# Patient Record
Sex: Male | Born: 1950
Health system: Southern US, Community
[De-identification: ages and names within clinical notes are randomized; demographics above are authoritative.]

## PROBLEM LIST (undated history)

## (undated) DIAGNOSIS — M1990 Unspecified osteoarthritis, unspecified site: Secondary | ICD-10-CM

## (undated) DIAGNOSIS — E669 Obesity, unspecified: Secondary | ICD-10-CM

## (undated) DIAGNOSIS — G4730 Sleep apnea, unspecified: Secondary | ICD-10-CM

## (undated) DIAGNOSIS — I471 Supraventricular tachycardia, unspecified: Secondary | ICD-10-CM

## (undated) DIAGNOSIS — E785 Hyperlipidemia, unspecified: Secondary | ICD-10-CM

## (undated) DIAGNOSIS — C801 Malignant (primary) neoplasm, unspecified: Secondary | ICD-10-CM

## (undated) DIAGNOSIS — J45909 Unspecified asthma, uncomplicated: Secondary | ICD-10-CM

## (undated) DIAGNOSIS — I2510 Atherosclerotic heart disease of native coronary artery without angina pectoris: Secondary | ICD-10-CM

## (undated) DIAGNOSIS — J449 Chronic obstructive pulmonary disease, unspecified: Secondary | ICD-10-CM

## (undated) DIAGNOSIS — I10 Essential (primary) hypertension: Secondary | ICD-10-CM

## (undated) DIAGNOSIS — R079 Chest pain, unspecified: Secondary | ICD-10-CM

## (undated) DIAGNOSIS — R002 Palpitations: Secondary | ICD-10-CM

## (undated) HISTORY — DX: Unspecified asthma, uncomplicated: J45.909

## (undated) HISTORY — DX: Essential (primary) hypertension: I10

## (undated) HISTORY — DX: Atherosclerotic heart disease of native coronary artery without angina pectoris: I25.10

## (undated) HISTORY — DX: Palpitations: R00.2

## (undated) HISTORY — DX: Chronic obstructive pulmonary disease, unspecified: J44.9

## (undated) HISTORY — PX: MOUTH SURGERY: SHX715

## (undated) HISTORY — PX: NASAL SINUS SURGERY: SHX719

## (undated) HISTORY — DX: Hyperlipidemia, unspecified: E78.5

## (undated) HISTORY — DX: Chest pain, unspecified: R07.9

## (undated) HISTORY — DX: Obesity, unspecified: E66.9

## (undated) HISTORY — DX: Supraventricular tachycardia, unspecified: I47.10

## (undated) HISTORY — PX: SKIN CANCER EXCISION: SHX779

---

## 2001-09-07 ENCOUNTER — Ambulatory Visit (HOSPITAL_COMMUNITY): Admission: RE | Admit: 2001-09-07 | Discharge: 2001-09-07 | Payer: Self-pay | Admitting: Family Medicine

## 2001-09-07 ENCOUNTER — Encounter: Payer: Self-pay | Admitting: Family Medicine

## 2003-06-28 ENCOUNTER — Ambulatory Visit (HOSPITAL_COMMUNITY): Admission: RE | Admit: 2003-06-28 | Discharge: 2003-06-28 | Payer: Self-pay | Admitting: Family Medicine

## 2004-07-10 DIAGNOSIS — R079 Chest pain, unspecified: Secondary | ICD-10-CM

## 2004-07-10 HISTORY — DX: Chest pain, unspecified: R07.9

## 2004-08-08 ENCOUNTER — Ambulatory Visit (HOSPITAL_COMMUNITY): Admission: RE | Admit: 2004-08-08 | Discharge: 2004-08-08 | Payer: Self-pay | Admitting: Family Medicine

## 2004-08-22 ENCOUNTER — Ambulatory Visit (HOSPITAL_COMMUNITY): Admission: RE | Admit: 2004-08-22 | Discharge: 2004-08-22 | Payer: Self-pay | Admitting: Family Medicine

## 2004-10-04 ENCOUNTER — Ambulatory Visit (HOSPITAL_COMMUNITY): Admission: RE | Admit: 2004-10-04 | Discharge: 2004-10-04 | Payer: Self-pay | Admitting: Internal Medicine

## 2004-10-04 ENCOUNTER — Ambulatory Visit: Payer: Self-pay | Admitting: Internal Medicine

## 2004-10-10 DIAGNOSIS — I10 Essential (primary) hypertension: Secondary | ICD-10-CM

## 2004-10-10 HISTORY — PX: COLONOSCOPY: SHX174

## 2004-10-10 HISTORY — DX: Essential (primary) hypertension: I10

## 2005-01-20 ENCOUNTER — Emergency Department (HOSPITAL_COMMUNITY): Admission: EM | Admit: 2005-01-20 | Discharge: 2005-01-20 | Payer: Self-pay | Admitting: Emergency Medicine

## 2005-08-22 ENCOUNTER — Emergency Department (HOSPITAL_COMMUNITY): Admission: EM | Admit: 2005-08-22 | Discharge: 2005-08-22 | Payer: Self-pay | Admitting: Emergency Medicine

## 2009-06-27 ENCOUNTER — Encounter: Payer: Self-pay | Admitting: Orthopedic Surgery

## 2009-07-01 ENCOUNTER — Encounter: Payer: Self-pay | Admitting: Orthopedic Surgery

## 2009-07-02 ENCOUNTER — Ambulatory Visit: Payer: Self-pay | Admitting: Orthopedic Surgery

## 2009-07-02 DIAGNOSIS — S8000XA Contusion of unspecified knee, initial encounter: Secondary | ICD-10-CM | POA: Insufficient documentation

## 2009-07-05 ENCOUNTER — Ambulatory Visit: Payer: Self-pay | Admitting: Orthopedic Surgery

## 2009-07-20 ENCOUNTER — Ambulatory Visit (HOSPITAL_COMMUNITY): Admission: RE | Admit: 2009-07-20 | Discharge: 2009-07-20 | Payer: Self-pay | Admitting: Family Medicine

## 2009-08-28 ENCOUNTER — Ambulatory Visit (HOSPITAL_COMMUNITY): Admission: RE | Admit: 2009-08-28 | Discharge: 2009-08-28 | Payer: Self-pay | Admitting: Cardiology

## 2009-09-04 ENCOUNTER — Ambulatory Visit (HOSPITAL_COMMUNITY): Admission: RE | Admit: 2009-09-04 | Discharge: 2009-09-05 | Payer: Self-pay | Admitting: Cardiology

## 2009-09-05 HISTORY — PX: OTHER SURGICAL HISTORY: SHX169

## 2009-10-15 ENCOUNTER — Ambulatory Visit (HOSPITAL_COMMUNITY): Admission: RE | Admit: 2009-10-15 | Discharge: 2009-10-15 | Payer: Self-pay | Admitting: Cardiology

## 2010-06-11 NOTE — Assessment & Plan Note (Signed)
Summary: RE-CK LT LEG/W.COMP DOI 06/27/09/CAF   Visit Type:  Follow-up Primary Provider:  Dr. Phillips Odor  CC:  left knee pain.  History of Present Illness: I saw Omar Ruiz in the office today for an initial visit.  He is a 60 years old man with the complaint of:  LEFT knee pain  He was injured on February 16.  He works for the evening while company, [employer]  He was out of work Wednesday Thursday Friday Saturday Sunday wants to go back to work today  He was seen at [facility] AP lateral femur AP lateral hip were negative  Complains of some lateral leg soreness and lateral distal femoral swelling.  Meds: Benicar, Fish oil, Aleve, Aspirin, Glucosamine.  DOING WELL NO PROBLEMS   PHYSICAL EXAM WAS NORMAL   D/C   FULL DUTY       Allergies: No Known Drug Allergies   Impression & Recommendations:  Problem # 1:  CONTUSION, LEFT KNEE (ICD-924.11) Assessment Improved  Orders: Est. Patient Level II (84696)  Patient Instructions: 1)  Please schedule a follow-up appointment as needed. 2)  full duty

## 2010-06-11 NOTE — Assessment & Plan Note (Signed)
Summary: LT KNEE/FEMUR INJURY/XRAY [facility]/BRING'G FILM,NOTES RECD...   Vital Signs:  Patient profile:   60 year old male Weight:      243 pounds Pulse rate:   74 / minute Resp:     16 per minute  Vitals Entered By: Fuller Canada MD (July 02, 2009 8:41 AM)  Visit Type:  Worker's Comp Initial Primary Provider:  Dr. Phillips Odor  CC:  left femur pain.Omar Ruiz  History of Present Illness: I saw Omar Ruiz in the office today for an initial visit.  He is a 60 years old man with the complaint of:  LEFT knee pain  He was injured on February 16.  He works for the evening while company, [employer]  He was out of work Wednesday Thursday Friday Saturday Sunday wants to go back to work today  [place] [facility] AP lateral femur AP lateral hip were negative  Complains of some lateral leg soreness and lateral distal femoral swelling.  Meds: Benicar, Fish oil, Aleve, Aspirin, Glucosamine.  he does not complain of pain at this time just some lateral thigh swelling there is a little ecchymosis at this area and subcutaneous skin discoloration.  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 Cardiac :  Denies chest pain, angina, heart attack, heart failure, poor circulation, blood clots, and phlebitis. Resp:  Denies short of breath, difficulty breathing, COPD, cough, and pneumonia. GI:  Denies nausea, vomiting, diarrhea, constipation, difficulty swallowing, ulcers, GERD, and reflux. GU:  Denies  kidney failure, kidney transplant, kidney stones, burning, poor stream, testicular cancer, blood in urine, and . Neuro:  Denies headache, dizziness, migraines, numbness, weakness, tremor, and unsteady walking. MS:  Denies joint pain, rheumatoid arthritis, joint swelling, gout, bone cancer, osteoporosis, and . Endo:  Denies thyroid disease, goiter, and diabetes. Psych:  Denies depression, mood swings, anxiety, panic attack, bipolar, and schizophrenia. Derm:  Denies eczema, cancer, and itching. EENT:  Denies poor vision, cataracts, glaucoma, poor hearing, vertigo, ears ringing, sinusitis, hoarseness, toothaches, and bleeding gums. Immunology:  Denies seasonal allergies, sinus problems, and allergic to bee stings. Lymphatic:  Denies lymph node cancer and lymph edema.  Physical Exam  Additional Exam:  vital signs see recorded data are stable  General appearance normal appearance  Orientation x3 normal  Mood and affect. Normal  Gait and station normal  Cardiovascular pulse temperature, normal  Lymph nodes normal and lower extremities  Sensation normal in lower extremities  Coordination and balance is normal  Body area:both knees examined  Inspection of tenderness in the lateral distal femur soft tissue normal on the LEFT knee normal RIGHT knee , no effusion either knee  Range of motion normal both knees  Stability normal both knees  Strength normal both knees      Impression & Recommendations:  Problem # 1:  CONTUSION, LEFT KNEE (ICD-924.11) Assessment New  x-rays AP lateral femur and hip with reports from [facility] read as normal I agree  Patient can return to work.  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  Orders: New Patient Level III (37106)  Patient Instructions: 1)  Ice left knee 3 x a day  2)  Bend the knee 25 x 3 x a day  3)  return to work to day  4)  f/u as needed

## 2010-06-11 NOTE — Letter (Signed)
Summary: Work [facility]  [address]  [place], [place] [address]   Phone: [phone]  Fax: [phone]    Today's Date: July 02, 2009  Name of Patient: Omar Ruiz  The above named patient had a medical visit today at:  am / pm.  Please take this into consideration when reviewing the time away from work/school.    Special Instructions:   [ X ] Patient is released to return to the normal work schedule today, July 02, 2009, full duty.  [  ] To be off until the next scheduled appointment on ______________________.  [ X] Other ____  Ice LT knee 3X a day Bend knee 25X, 3X a day _______________________________________ ________________________________________________________________________   Sincerely yours,   Terrance Mass, MD

## 2010-06-11 NOTE — Letter (Signed)
Summary: Pt History form  Pt History form   Imported By: Cammie Sickle 08/27/2009 07:07:47  _____________________________________________________________________  External Attachment:    Type:   Image     Comment:   External Document

## 2010-06-11 NOTE — Letter (Signed)
Summary: Internal Other  Internal Other   Imported By: Elvera Maria 07/04/2009 13:29:58  _____________________________________________________________________  External Attachment:    Type:   Image     Comment:   workers comp info

## 2010-06-11 NOTE — Letter (Signed)
Summary: [facility] Medical records W.Comp injury 06/27/09  [facility] records W.Comp injury 06/27/09   Imported By: Cammie Sickle 07/30/2009 11:40:14  _____________________________________________________________________  External Attachment:    Type:   Image     Comment:   External Document

## 2010-06-11 NOTE — Letter (Signed)
Summary: Work note  [facility]  [address]  [place], [place] [address]   Phone: [phone]  Fax: [phone]    July 05, 2009   Employee:  Terius A [employer]    To Whom It May Concern:   For Medical reasons, please excuse the above named employee from work for the following dates:  Start:   Patient had a medical visit in our office today, 8:30 am appointment.  End/Return to work today:  Medically released for full duty work.   If you need additional information, please feel free to contact our office.         Sincerely,    Terrance Mass, MD

## 2010-07-30 LAB — BASIC METABOLIC PANEL
CO2: 29 mEq/L (ref 19–32)
Chloride: 107 mEq/L (ref 96–112)
Glucose, Bld: 96 mg/dL (ref 70–99)
Potassium: 3.6 mEq/L (ref 3.5–5.1)
Sodium: 140 mEq/L (ref 135–145)

## 2010-07-30 LAB — CBC
HCT: 38.1 % — ABNORMAL LOW (ref 39.0–52.0)
Hemoglobin: 13.1 g/dL (ref 13.0–17.0)
MCHC: 34.4 g/dL (ref 30.0–36.0)
Platelets: 216 10*3/uL (ref 150–400)
RBC: 4.12 MIL/uL — ABNORMAL LOW (ref 4.22–5.81)

## 2010-09-27 NOTE — Op Note (Signed)
Omar Ruiz, Omar Ruiz              ACCOUNT NO.:  [id number]   MEDICAL RECORD NO.:  [id number]          PATIENT TYPE:  AMB   LOCATION:  DAY                           FACILITY:  [facility]   PHYSICIAN:  R. Roetta Sessions, M.D. DATE OF BIRTH:  [birth year]   DATE OF PROCEDURE:  10/04/2004  DATE OF DISCHARGE:                                 OPERATIVE REPORT   PROCEDURE:  Screening colonoscopy.   INDICATIONS FOR PROCEDURE:  The patient is a 60 year old Caucasian male sent  over at the courtesy of Dr. Phillips Odor for colorectal cancer screening. He is  devoid of any lower GI tract symptoms. No family history of colorectal  neoplasia. He has never had his colon imaged. Colonoscopy is now being done  as a screening maneuver. This approach has been discussed with the patient  along with potential risks, benefits, and alternatives. Questions answered.  He is agreeable. Please see documentation in the medical record.   PROCEDURE NOTE:  O2 saturation, blood pressure, pulse, and respirations were  monitored throughout the entire procedure. Conscious sedation with Versed 3  mg IV and Demerol 75 mg IV in divided doses.   INSTRUMENT:  Olympus video chip system.   FINDINGS:  Digital rectal exam revealed no abnormalities.   ENDOSCOPIC FINDINGS:  Prep was good.   Rectum:  Examination of the rectal mucosa including retroflexed view of the  anal verge revealed no abnormalities.   Colon:  Colonic mucosa was surveyed from the rectosigmoid junction through  the left, transverse, and right colon to the area of the appendiceal  orifice, ileocecal valve, and cecum. These structures were well seen and  photographed for the record. From this level, the scope was slowly  withdrawn. All previously mentioned mucosa surfaces were again. The colonic  mucosa appeared normal. The patient tolerated the procedure well and was  reactive to endoscopy.   IMPRESSION:  Normal appearing rectum and colon.   RECOMMENDATIONS:   Repeat screening colonoscopy 10 years. Annual stool  Hemoccults.      RMR/MEDQ  D:  10/04/2004  T:  10/04/2004  Job:  [id number]   cc:   Corrie Mckusick, M.D.  Fax: [phone]

## 2012-01-22 ENCOUNTER — Encounter: Payer: Self-pay | Admitting: Orthopedic Surgery

## 2012-01-22 ENCOUNTER — Ambulatory Visit (INDEPENDENT_AMBULATORY_CARE_PROVIDER_SITE_OTHER): Payer: 59 | Admitting: Orthopedic Surgery

## 2012-01-22 ENCOUNTER — Ambulatory Visit (INDEPENDENT_AMBULATORY_CARE_PROVIDER_SITE_OTHER): Payer: 59

## 2012-01-22 VITALS — BP 130/80 | Ht 72.5 in | Wt 243.0 lb

## 2012-01-22 DIAGNOSIS — S83209A Unspecified tear of unspecified meniscus, current injury, unspecified knee, initial encounter: Secondary | ICD-10-CM | POA: Insufficient documentation

## 2012-01-22 DIAGNOSIS — IMO0002 Reserved for concepts with insufficient information to code with codable children: Secondary | ICD-10-CM

## 2012-01-22 DIAGNOSIS — M25561 Pain in right knee: Secondary | ICD-10-CM

## 2012-01-22 DIAGNOSIS — M25569 Pain in unspecified knee: Secondary | ICD-10-CM

## 2012-01-22 DIAGNOSIS — M1710 Unilateral primary osteoarthritis, unspecified knee: Secondary | ICD-10-CM | POA: Insufficient documentation

## 2012-01-22 DIAGNOSIS — M25469 Effusion, unspecified knee: Secondary | ICD-10-CM | POA: Insufficient documentation

## 2012-01-22 NOTE — Progress Notes (Signed)
Subjective:    Patient ID: Omar Ruiz, male    DOB: 09/20/1950, 61 y.o.   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 Presents now with a swollen knee and tightness in the back of the knee medial and lateral joint pain  Knee Pain  The incident occurred more than 1 week ago. The incident occurred at work. The injury mechanism is unknown. The pain is present in the right knee. Quality: Dull and throbbing. The pain is at a severity of 4/10. The pain has been improving since onset. Pertinent negatives include no inability to bear weight, loss of motion, loss of sensation, muscle weakness, numbness or tingling. Associated symptoms comments: Swelling.      Review of Systems  Neurological: Negative for tingling and numbness.  All other systems reviewed and are negative.       Objective:   Physical Exam  Constitutional: He is oriented to person, place, and time. He appears well-developed and well-nourished.  Cardiovascular: Intact distal pulses.   Musculoskeletal:       Right knee: He exhibits effusion.       Ambulation reveals that he favors the right lower extremity  Lymphadenopathy:       Right: No inguinal adenopathy present.       Left: No inguinal adenopathy present.  Neurological: He is alert and oriented to person, place, and time. He has normal reflexes. He displays normal reflexes. No cranial nerve deficit. He exhibits normal muscle tone. Coordination normal.       Normal sensation  Skin: Skin is warm and dry. No rash noted. No erythema. No pallor.  Psychiatric: He has a normal mood and affect. His behavior is normal. Judgment and thought content normal.  Right Knee Exam   Tenderness  The patient is experiencing tenderness in the medial joint line and lateral joint line.  Range of Motion  The patient has normal right knee ROM.  Muscle Strength    The patient has normal right knee strength.  Tests  McMurray:  Medial - negative Lateral - negative Lachman:  Anterior - negative    Posterior - negative Drawer:       Anterior - negative    Posterior - negative Varus: negative Valgus: negative Patellar Apprehension: negative  Other  Erythema: absent Scars: absent Sensation: normal Pulse: present Swelling: severe Other tests: effusion present   Left Knee Exam  Left knee exam is normal.  Tenderness  The patient is experiencing no tenderness.     Range of Motion  The patient has normal left knee ROM.  Tests  Lachman:  Anterior - negative    Posterior - negative Pivot Shift: negative  Other  Erythema: absent Scars: absent Sensation: normal Pulse: present Swelling: none     Upper extremity exam  Inspection and palpation revealed no abnormalities in the upper extremities.  Range of motion is full without contracture.  Motor exam is normal with grade 5 strength.  The joints are fully reduced without subluxation.  There is no atrophy or tremor and muscle tone is normal.  All joints are stable.   Imaging shows that the medial joint space is slightly narrowed there is a joint effusion       Assessment & Plan:  The differential diagnosis torn medial meniscus, osteoarthritis, joint effusion.  Plan aspiration injection continue Aleve. Apply ice daily for 30 minutes.  Knee  Injection and aspiration Procedure Note  Pre-operative Diagnosis: Right knee swelling  Post-operative Diagnosis: same  Indications: pain, swelling  Anesthesia: ethyl chloride   Procedure Details   Verbal consent was obtained for the procedure. Time out was completed.The joint was prepped with alcohol, followed by  Ethyl chloride spray and The 18-gauge needle was inserted into the joint via lateral approach and we aspirated approximately 60 cc of clear yellow fluid  This was followed by the injection of 4ml 1% lidocaine and 1 ml  of depomedrol  was then injected into the joint . The needle was removed and the area cleansed and dressed.  Complications:  None; patient tolerated the procedure well.

## 2012-01-22 NOTE — Patient Instructions (Addendum)
You have received a steroid shot. 15% of patients experience increased pain at the injection site with in the next 24 hours. This is best treated with ice and tylenol extra strength 2 tabs every 8 hours. If you are still having pain please call the office.   Wear brace next 4 weeks   Apply ice to the knee every day for 30 minutes   Knee Effusion The medical term for having fluid in your knee is effusion. This is often due to an internal derangement of the knee. This means something is wrong inside the knee. Some of the causes of fluid in the knee may be torn cartilage, a torn ligament, or bleeding into the joint from an injury. Your knee is likely more difficult to bend and move. This is often because there is increased pain and pressure in the joint. The time it takes for recovery from a knee effusion depends on different factors, including:    Type of injury.   Your age.   Physical and medical conditions.   Rehabilitation Strategies.  How long you will be away from your normal activities will depend on what kind of knee problem you have and how much damage is present. Your knee has two types of cartilage. Articular cartilage covers the bone ends and lets your knee bend and move smoothly. Two menisci, thick pads of cartilage that form a rim inside the joint, help absorb shock and stabilize your knee. Ligaments bind the bones together and support your knee joint. Muscles move the joint, help support your knee, and take stress off the joint itself. CAUSES   Often an effusion in the knee is caused by an injury to one of the menisci. This is often a tear in the cartilage. Recovery after a meniscus injury depends on how much meniscus is damaged and whether you have damaged other knee tissue. Small tears may heal on their own with conservative treatment. Conservative means rest, limited weight bearing activity and muscle strengthening exercises. Your recovery may take up to 6 weeks.   TREATMENT     Larger tears may require surgery. Meniscus injuries may be treated during arthroscopy. Arthroscopy is a procedure in which your surgeon uses a small telescope like instrument to look in your knee. Your caregiver can make a more accurate diagnosis (learning what is wrong) by performing an arthroscopic procedure. If your injury is on the inner margin of the meniscus, your surgeon may trim the meniscus back to a smooth rim. In other cases your surgeon will try to repair a damaged meniscus with stitches (sutures). This may make rehabilitation take longer, but may provide better long term result by helping your knee keep its shock absorption capabilities. Ligaments which are completely torn usually require surgery for repair. HOME CARE INSTRUCTIONS  Use crutches as instructed.   If a brace is applied, use as directed.   Once you are home, an ice pack applied to your swollen knee may help with discomfort and help decrease swelling.   Keep your knee raised (elevated) when you are not up and around or on crutches.   Only take over-the-counter or prescription medicines for pain, discomfort, or fever as directed by your caregiver.   Your caregivers will help with instructions for rehabilitation of your knee. This often includes strengthening exercises.   You may resume a normal diet and activities as directed.  SEEK MEDICAL CARE IF:    There is increased swelling in your knee.   You notice redness,  swelling, or increasing pain in your knee.   An unexplained oral temperature above 102 F (38.9 C) develops.  SEEK IMMEDIATE MEDICAL CARE IF:    You develop a rash.   You have difficulty breathing.   You have any allergic reactions from medications you may have been given.   There is severe pain with any motion of the knee.  MAKE SURE YOU:    Understand these instructions.   Will watch your condition.   Will get help right away if you are not doing well or get worse.  Document Released:  07/19/2003 Document Revised: 04/17/2011 Document Reviewed: 09/22/2007 [facility] Patient Information 2012 [place], [place].

## 2012-02-19 ENCOUNTER — Ambulatory Visit (INDEPENDENT_AMBULATORY_CARE_PROVIDER_SITE_OTHER): Payer: 59 | Admitting: Orthopedic Surgery

## 2012-02-19 ENCOUNTER — Encounter: Payer: Self-pay | Admitting: Orthopedic Surgery

## 2012-02-19 VITALS — BP 120/70 | Ht 72.5 in | Wt 243.0 lb

## 2012-02-19 DIAGNOSIS — M25561 Pain in right knee: Secondary | ICD-10-CM

## 2012-02-19 DIAGNOSIS — IMO0002 Reserved for concepts with insufficient information to code with codable children: Secondary | ICD-10-CM

## 2012-02-19 DIAGNOSIS — S83209A Unspecified tear of unspecified meniscus, current injury, unspecified knee, initial encounter: Secondary | ICD-10-CM

## 2012-02-19 DIAGNOSIS — M25469 Effusion, unspecified knee: Secondary | ICD-10-CM

## 2012-02-19 DIAGNOSIS — M25569 Pain in unspecified knee: Secondary | ICD-10-CM

## 2012-02-19 NOTE — Patient Instructions (Signed)
activities as tolerated 

## 2012-02-19 NOTE — Progress Notes (Signed)
Patient ID: Omar Ruiz, male   DOB: [birth year], 61 y.o.   MRN: [id number] Chief Complaint  Patient presents with  . Follow-up    recheck right knee effusion, DOI 01/12/12     the patient and his right knee we did an aspiration and injection he says is completely better  His exam is benign with no medial tenderness and negative McMurray  Followup as needed improved

## 2012-08-02 ENCOUNTER — Encounter: Payer: Self-pay | Admitting: *Deleted

## 2012-11-02 ENCOUNTER — Other Ambulatory Visit: Payer: Self-pay | Admitting: *Deleted

## 2012-11-02 MED ORDER — PRAVASTATIN SODIUM 40 MG PO TABS: 40.0000 mg | ORAL_TABLET | Freq: Every day | ORAL | Status: DC

## 2012-11-02 NOTE — Telephone Encounter (Signed)
Pravastatin refilled electronically

## 2012-12-16 ENCOUNTER — Ambulatory Visit (INDEPENDENT_AMBULATORY_CARE_PROVIDER_SITE_OTHER): Payer: Worker's Compensation | Admitting: Orthopedic Surgery

## 2012-12-16 ENCOUNTER — Encounter: Payer: Self-pay | Admitting: Orthopedic Surgery

## 2012-12-16 ENCOUNTER — Ambulatory Visit (HOSPITAL_COMMUNITY)
Admission: RE | Admit: 2012-12-16 | Discharge: 2012-12-16 | Disposition: A | Discharge status: Home / Self Care | Payer: Worker's Compensation | Source: Ambulatory Visit | Attending: Orthopedic Surgery | Admitting: Orthopedic Surgery

## 2012-12-16 ENCOUNTER — Other Ambulatory Visit: Payer: Self-pay | Admitting: Orthopedic Surgery

## 2012-12-16 VITALS — BP 136/82 | Ht 73.0 in | Wt 237.0 lb

## 2012-12-16 DIAGNOSIS — M47814 Spondylosis without myelopathy or radiculopathy, thoracic region: Secondary | ICD-10-CM | POA: Insufficient documentation

## 2012-12-16 DIAGNOSIS — M549 Dorsalgia, unspecified: Secondary | ICD-10-CM

## 2012-12-16 DIAGNOSIS — S20229A Contusion of unspecified back wall of thorax, initial encounter: Secondary | ICD-10-CM

## 2012-12-16 DIAGNOSIS — M546 Pain in thoracic spine: Secondary | ICD-10-CM | POA: Insufficient documentation

## 2012-12-16 MED ORDER — IBUPROFEN 800 MG PO TABS: 800.0000 mg | ORAL_TABLET | Freq: Three times a day (TID) | ORAL | Status: DC | PRN

## 2012-12-16 MED ORDER — METHOCARBAMOL 500 MG PO TABS: 500.0000 mg | ORAL_TABLET | Freq: Every day | ORAL | Status: DC

## 2012-12-16 NOTE — Patient Instructions (Addendum)
Ibuprofen 800 mg 3 x a day   Start a muscle relaxer as needed for muscle spasms (if needed)  Ice apply as needed x 48 hours then heat

## 2012-12-16 NOTE — Progress Notes (Signed)
Patient ID: Omar Ruiz, male   DOB: July 08, 1950, 62 y.o.   MRN: [id number]  Chief Complaint  Patient presents with  . Back Pain    back pain d/t fell at work 12/10/12    Workers compensation injury [employer] representative company contact named Delorise Shiner gross phone number [phone] fax number [phone]  Insurer generated insurance [phone]  These documents will be scanned but I will do a summation of the patient's responses to the intake questions.  Chief complaint back Location where you're hurting below shoulders above waist right side [name] did start 12/10/2012 How did it start injury Date of injury August 1 where you injured yes  The patient describes falling from his standing position, his feet gave out from under him he fell backwards and fell onto a piece of piping which his wife showed me and a cell phone for. He received treatment for this problem no axillae the patient circled sharp and throbbing he described his pain and he described his pain intensity is 8/10. Says it comes and goes. It's worse with movement is better with rest. 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 Cardiovascular peripheral pulses were intact no swelling Cervical area no lymphadenopathy And gait and station were not evaluated Head neck spine inspection on the right side in the thoracic region below the shoulder blades was a large area of swelling without ecchymosis this area was tender the bony spine was nontender there is no kyphotic deformity or atrophy. The skin was normal on the trunk head and neck the upper extremities have normal reflexes he had normal strength in his upper and lower extremities. When he tried to raise his arms over his head actively he can only raise to about 100 he had pain and had pain with passive range of motion of 280. He was oriented x3 his mood and affect were normal. Pathologic reflexes none  An x-ray was obtained and it showed degenerative arthritis thoracic spine but no fracture  Impression Encounter Diagnoses  Name Primary?  . Back pain Yes  . Contusion of back, unspecified laterality, initial encounter     Right now is difficult to tell why his legs feel weak. The thoracic area is swollen so there is a contusion there which would explain his difficulty with for elevation of his arms and shoulders.  At this point I would give him a muscle relaxer to take at night to try to get this area to loosen up some. He can take 800 of ibuprofen 3 times a day  Like to reexamine him  in 2 weeks.

## 2012-12-22 ENCOUNTER — Other Ambulatory Visit: Payer: Self-pay | Admitting: *Deleted

## 2012-12-27 ENCOUNTER — Telehealth: Payer: Self-pay

## 2012-12-27 MED ORDER — PRAVASTATIN SODIUM 40 MG PO TABS: 40.0000 mg | ORAL_TABLET | Freq: Every day | ORAL | Status: DC

## 2012-12-27 NOTE — Telephone Encounter (Signed)
Rx was sent to pharmacy electronically. 

## 2013-01-04 ENCOUNTER — Ambulatory Visit (INDEPENDENT_AMBULATORY_CARE_PROVIDER_SITE_OTHER): Payer: Worker's Compensation | Admitting: Orthopedic Surgery

## 2013-01-04 ENCOUNTER — Encounter: Payer: Self-pay | Admitting: Orthopedic Surgery

## 2013-01-04 VITALS — BP 127/83 | Ht 73.0 in | Wt 237.0 lb

## 2013-01-04 DIAGNOSIS — Z5189 Encounter for other specified aftercare: Secondary | ICD-10-CM

## 2013-01-04 DIAGNOSIS — S20221D Contusion of right back wall of thorax, subsequent encounter: Secondary | ICD-10-CM

## 2013-01-04 NOTE — Patient Instructions (Addendum)
Return in 3 weeks

## 2013-01-04 NOTE — Progress Notes (Signed)
Patient ID: Omar Ruiz, male   DOB: 26-Jan-1951, 62 y.o.   MRN: [id number]  Chief Complaint  Patient presents with  . Follow-up    2 week recheck back and legs  DOI 12/10/12    back pain d/t fell at work 12/10/12     Workers compensation injury [employer] representative company contact named Delorise Shiner gross phone number [phone] fax number [phone]  Insurer generated insurance [phone]  These documents will be scanned but I will do a summation of the patient's responses to the intake questions.  Chief complaint back Location where you're hurting below shoulders above waist right side Omar Ruiz did start 12/10/2012 How did it start injury Date of injury August 1 where you injured yes  The patient describes falling from his standing position, his feet gave out from under him he fell backwards and fell onto a piece of piping which his wife showed me and a cell phone for. He received treatment for this problem no axillae the patient circled sharp and throbbing he described his pain and he described his pain intensity is 8/10. Says it comes and goes. It's worse with movement is better with rest. He did not circle bruising numbness tingling locking catching or swelling but he does indicate that there is a knot on his back in the area where he fell that has persisted for the last 6 days and he also says that his legs feel weak and he has painful for elevation of both shoulders  In a followup visit the patient perceives that his back is getting better although he still having pain in the same area  He denies any numbness tingling or weakness  BP 127/83  Ht 6\' 1"  (1.854 m)  Wt 237 lb (107.502 kg)  BMI 31.27 kg/m2 General appearance is normal, the patient is alert and oriented x3 with normal mood and affect. Right side of his back still a little swollen still tender neurovascular exam intact  Back contusion  Follow up in 3 weeks continue current medications of Robaxin and  ibuprofen

## 2013-01-25 ENCOUNTER — Ambulatory Visit (INDEPENDENT_AMBULATORY_CARE_PROVIDER_SITE_OTHER): Payer: Worker's Compensation | Admitting: Orthopedic Surgery

## 2013-01-25 VITALS — BP 136/86 | Ht 72.0 in | Wt 237.0 lb

## 2013-01-25 DIAGNOSIS — M549 Dorsalgia, unspecified: Secondary | ICD-10-CM

## 2013-01-26 NOTE — Progress Notes (Signed)
Patient ID: Quinn Plowman, male   DOB: 1951/01/06, 62 y.o.   MRN: [id number]  Chief Complaint  Patient presents with  . Follow-up    3 Week recheck back and legs    Workers compensation injury  Recheck right thoracic back injury  The patient still has a large lump over the injured area. He denies any numbness tingling weakness or significant discomfort  Examination shows a swollen area in the thoracic region of his lumbar spine on the right eye with tenderness. Has normal neurovascular function in both upper and lower extremities  Recommend followup at 12 weeks post injury I do not see any neurologic deficits.

## 2013-02-22 ENCOUNTER — Ambulatory Visit (HOSPITAL_COMMUNITY)
Admission: RE | Admit: 2013-02-22 | Discharge: 2013-02-22 | Disposition: A | Discharge status: Home / Self Care | Payer: 59 | Source: Ambulatory Visit | Attending: Family Medicine | Admitting: Family Medicine

## 2013-02-22 ENCOUNTER — Other Ambulatory Visit (HOSPITAL_COMMUNITY): Payer: Self-pay | Admitting: Family Medicine

## 2013-02-22 DIAGNOSIS — E785 Hyperlipidemia, unspecified: Secondary | ICD-10-CM

## 2013-02-22 DIAGNOSIS — I2510 Atherosclerotic heart disease of native coronary artery without angina pectoris: Secondary | ICD-10-CM

## 2013-02-22 DIAGNOSIS — I10 Essential (primary) hypertension: Secondary | ICD-10-CM | POA: Insufficient documentation

## 2013-02-22 DIAGNOSIS — J4489 Other specified chronic obstructive pulmonary disease: Secondary | ICD-10-CM | POA: Insufficient documentation

## 2013-02-22 DIAGNOSIS — Z0000 Encounter for general adult medical examination without abnormal findings: Secondary | ICD-10-CM

## 2013-02-22 DIAGNOSIS — J449 Chronic obstructive pulmonary disease, unspecified: Secondary | ICD-10-CM | POA: Insufficient documentation

## 2013-02-22 DIAGNOSIS — Z87891 Personal history of nicotine dependence: Secondary | ICD-10-CM | POA: Insufficient documentation

## 2013-03-17 DIAGNOSIS — I2510 Atherosclerotic heart disease of native coronary artery without angina pectoris: Secondary | ICD-10-CM | POA: Insufficient documentation

## 2013-03-24 ENCOUNTER — Ambulatory Visit (INDEPENDENT_AMBULATORY_CARE_PROVIDER_SITE_OTHER): Payer: Worker's Compensation | Admitting: Orthopedic Surgery

## 2013-03-24 VITALS — BP 129/82 | Ht 73.0 in | Wt 237.0 lb

## 2013-03-24 DIAGNOSIS — Z5189 Encounter for other specified aftercare: Secondary | ICD-10-CM

## 2013-03-24 DIAGNOSIS — S20221D Contusion of right back wall of thorax, subsequent encounter: Secondary | ICD-10-CM

## 2013-03-24 NOTE — Progress Notes (Signed)
Patient ID: Omar Ruiz, male   DOB: 06-08-1950, 62 y.o.   MRN: [id number] [name] comes in after back injury for final checkup  Chief Complaint  Patient presents with  . Follow-up    3 month recheck Back DOI 12/10/12    Main complaint now is when he reaches down to his side bending laterally he has some discomfort in the same area and there is still some swelling in the back area. He denies numbness or tingling.  Exam shows adequate flexion extension sidebending with pain on side bending to his right and side bending to the left he has normal flexion he has a palpable swelling in the right side of his back which has persisted throughout his injury although smaller. He has no neurovascular deficits he is awake alert and oriented x3 his mood and affect are normal his overall appearance is normal his vital signs are stable BP 129/82  Ht 6\' 1"  (1.854 m)  Wt 237 lb (107.502 kg)  BMI 31.27 kg/m2  Impression contusion back  He is discharged we do note his complaints as noted above he is advised that if he has numbness or tingling in his legs that he should call the office immediately

## 2013-03-24 NOTE — H&P (Signed)
  NTS SOAP Note  Vital Signs:  Vitals as of: 03/24/2013: Systolic 130: Diastolic 79: Heart Rate 73: Temp 60F: Height 8ft 1in: Weight 233Lbs 0 Ounces: BMI 30.74  BMI : 30.74 kg/m2  Subjective: This 67 Years 33 Months old Male presents for screening TCS.  Has had TCS > ten years ago.  Has had some weight loss.  No GI complaints.  No family h/o colon cancer.   Review of Symptoms:  Constitutional:  weight loss Head:unremarkable    Eyes:unremarkable   Nose/Mouth/Throat:unremarkable Cardiovascular:  unremarkable   Respiratory:  wheezing Gastrointestinal:  unremarkable   Genitourinary:unremarkable       joint and back pain Skin:unremarkable Hematolgic/Lymphatic:unremarkable     Allergic/Immunologic:unremarkable     Past Medical History:    Reviewed   Past Medical History  Surgical History: none Medical Problems: high cholesterol, h/o HTN, COPD Allergies: nkda Medications: baby asa, metoprolol, pravastatin, advair   Social History:Reviewed  Social History  Preferred Language: English Race:  White Ethnicity: Not Hispanic / Latino Age: 62 Years 8 Months Marital Status:  M Alcohol: 12-18 beers a week Recreational drug(s):  No   Smoking Status: Former smoker reviewed on 03/24/2013 Started Date:  Stopped Date:  Functional Status reviewed on mm/dd/yyyy ------------------------------------------------ Bathing: Normal Cooking: Normal Dressing: Normal Driving: Normal Eating: Normal Managing Meds: Normal Oral Care: Normal Shopping: Normal Toileting: Normal Transferring: Normal Walking: Normal Cognitive Status reviewed on mm/dd/yyyy ------------------------------------------------ Attention: Normal Decision Making: Normal Language: Normal Memory: Normal Motor: Normal Perception: Normal Problem Solving: Normal Visual and Spatial: Normal   Family History:  Reviewed  Family Health History Mother, Deceased; Diabetes  mellitus, unspecified type;  Father, Deceased; Heart attack (myocardial infarction);     Objective Information: General:  Well appearing, well nourished in no distress. Neck:  Supple without lymphadenopathy.  Heart:  RRR, no murmur Abdomen:Soft, NT/ND, no HSM, no masses.   deferred to procedure  Assessment:Need for screening TCS  Diagnosis &amp; Procedure Smart Code   Plan:Scheduled for TCS on 03/29/13.   Patient Education:Alternative treatments to surgery were discussed with patient (and family).  Risks and benefits  of procedure including bleeding and perforation were fully explained to the patient (and family) who gave informed consent. Patient/family questions were addressed.  Follow-up:Pending Surgery

## 2013-03-24 NOTE — Patient Instructions (Signed)
Activities as tolerated. 

## 2013-03-24 NOTE — Progress Notes (Signed)
Patient ID: Omar Ruiz, male   DOB: 1951/01/21, 62 y.o.   MRN: [id number] Swollen area right side of the back   No numbness or tingling   Flexion is good   Side bending feels pain

## 2013-03-29 ENCOUNTER — Encounter (HOSPITAL_COMMUNITY): Payer: Self-pay | Admitting: *Deleted

## 2013-03-29 ENCOUNTER — Ambulatory Visit (HOSPITAL_COMMUNITY)
Admission: RE | Admit: 2013-03-29 | Discharge: 2013-03-29 | Disposition: A | Discharge status: Home / Self Care | Payer: 59 | Source: Ambulatory Visit | Attending: General Surgery | Admitting: General Surgery

## 2013-03-29 ENCOUNTER — Encounter (HOSPITAL_COMMUNITY)
Admission: RE | Disposition: A | Discharge status: Home / Self Care | Payer: Self-pay | Source: Ambulatory Visit | Attending: General Surgery

## 2013-03-29 DIAGNOSIS — J4489 Other specified chronic obstructive pulmonary disease: Secondary | ICD-10-CM | POA: Insufficient documentation

## 2013-03-29 DIAGNOSIS — I10 Essential (primary) hypertension: Secondary | ICD-10-CM | POA: Insufficient documentation

## 2013-03-29 DIAGNOSIS — Z1211 Encounter for screening for malignant neoplasm of colon: Secondary | ICD-10-CM | POA: Insufficient documentation

## 2013-03-29 DIAGNOSIS — J449 Chronic obstructive pulmonary disease, unspecified: Secondary | ICD-10-CM | POA: Insufficient documentation

## 2013-03-29 DIAGNOSIS — E7800 Pure hypercholesterolemia, unspecified: Secondary | ICD-10-CM | POA: Insufficient documentation

## 2013-03-29 HISTORY — PX: COLONOSCOPY: SHX5424

## 2013-03-29 SURGERY — COLONOSCOPY
Anesthesia: Moderate Sedation

## 2013-03-29 MED ORDER — STERILE WATER FOR IRRIGATION IR SOLN
Status: DC | PRN
  Administered 2013-03-29: 09:00:00

## 2013-03-29 MED ORDER — MIDAZOLAM HCL 5 MG/5ML IJ SOLN
INTRAMUSCULAR | Status: AC
  Filled 2013-03-29: qty 10

## 2013-03-29 MED ORDER — MIDAZOLAM HCL 5 MG/5ML IJ SOLN
INTRAMUSCULAR | Status: DC | PRN
  Administered 2013-03-29: 2 mg via INTRAVENOUS

## 2013-03-29 MED ORDER — MEPERIDINE HCL 50 MG/ML IJ SOLN
INTRAMUSCULAR | Status: DC | PRN
  Administered 2013-03-29: 50 mg via INTRAVENOUS

## 2013-03-29 MED ORDER — MEPERIDINE HCL 50 MG/ML IJ SOLN
INTRAMUSCULAR | Status: AC
  Filled 2013-03-29: qty 1

## 2013-03-29 MED ORDER — SODIUM CHLORIDE 0.9 % IV SOLN
INTRAVENOUS | Status: DC
  Administered 2013-03-29: 1000 mL via INTRAVENOUS

## 2013-03-29 NOTE — Op Note (Signed)
[facility] [address] [place], [address]   COLONOSCOPY PROCEDURE REPORT  PATIENT: Omar Ruiz, Omar Ruiz  MR#: [id number] BIRTHDATE: 11-05-1950 , 62  yrs. old GENDER: Male ENDOSCOPIST: Franky Macho, MD REFERRED XB:[id number], John PROCEDURE DATE:  03/29/2013 PROCEDURE:   Colonoscopy, screening ASA CLASS:   Class II INDICATIONS:Average risk patient for colon cancer. MEDICATIONS: Versed 3 mg IV and Demerol 50 mg IV  DESCRIPTION OF PROCEDURE:   After the risks benefits and alternatives of the procedure were thoroughly explained, informed consent was obtained.  A digital rectal exam revealed no abnormalities of the rectum.   The EC-[id number] ([id number])  endoscope was introduced through the anus and advanced to the cecum, which was identified by both the appendix and ileocecal valve. No adverse events experienced.   The quality of the prep was adequate, using MoviPrep  The instrument was then slowly withdrawn as the colon was fully examined.      COLON FINDINGS: A normal appearing cecum, ileocecal valve, and appendiceal orifice were identified.  The ascending, hepatic flexure, transverse, splenic flexure, descending, sigmoid colon and rectum appeared unremarkable.  No polyps or cancers were seen. Retroflexed views revealed no abnormalities. The time to cecum=5 minutes 0 seconds.  Withdrawal time=4 minutes 0 seconds.  The scope was withdrawn and the procedure completed. COMPLICATIONS: There were no complications.  ENDOSCOPIC IMPRESSION: Normal colon  RECOMMENDATIONS: Repeat Colonscopy in 10 years.   eSigned:  Franky Macho, MD 03/29/2013 9:23 AM   cc:

## 2013-03-29 NOTE — Interval H&P Note (Signed)
History and Physical Interval Note:  03/29/2013 9:04 AM  Quinn Plowman  has presented today for surgery, with the diagnosis of screening colonoscopy  The various methods of treatment have been discussed with the patient and family. After consideration of risks, benefits and other options for treatment, the patient has consented to  Procedure(s): COLONOSCOPY (N/A) as a surgical intervention .  The patient's history has been reviewed, patient examined, no change in status, stable for surgery.  I have reviewed the patient's chart and labs.  Questions were answered to the patient's satisfaction.     Franky Macho A

## 2013-04-05 ENCOUNTER — Encounter (HOSPITAL_COMMUNITY): Payer: Self-pay | Admitting: General Surgery

## 2014-08-22 ENCOUNTER — Ambulatory Visit (INDEPENDENT_AMBULATORY_CARE_PROVIDER_SITE_OTHER): Payer: 59 | Admitting: Cardiovascular Disease

## 2014-08-22 ENCOUNTER — Encounter: Payer: Self-pay | Admitting: Cardiovascular Disease

## 2014-08-22 VITALS — BP 122/80 | HR 69 | Ht 73.0 in | Wt 234.0 lb

## 2014-08-22 DIAGNOSIS — I25118 Atherosclerotic heart disease of native coronary artery with other forms of angina pectoris: Secondary | ICD-10-CM

## 2014-08-22 DIAGNOSIS — Z136 Encounter for screening for cardiovascular disorders: Secondary | ICD-10-CM | POA: Diagnosis not present

## 2014-08-22 DIAGNOSIS — I10 Essential (primary) hypertension: Secondary | ICD-10-CM | POA: Diagnosis not present

## 2014-08-22 DIAGNOSIS — Z955 Presence of coronary angioplasty implant and graft: Secondary | ICD-10-CM

## 2014-08-22 DIAGNOSIS — E785 Hyperlipidemia, unspecified: Secondary | ICD-10-CM

## 2014-08-22 MED ORDER — SIMVASTATIN 20 MG PO TABS: 20.0000 mg | ORAL_TABLET | Freq: Every day | ORAL | Status: DC

## 2014-08-22 MED ORDER — METOPROLOL SUCCINATE ER 25 MG PO TB24: 25.0000 mg | ORAL_TABLET | Freq: Every day | ORAL | Status: DC

## 2014-08-22 MED ORDER — NITROGLYCERIN 0.4 MG SL SUBL: 0.4000 mg | SUBLINGUAL_TABLET | SUBLINGUAL | Status: DC | PRN

## 2014-08-22 NOTE — Progress Notes (Signed)
Patient ID: Omar Ruiz, male   DOB: 07-07-1950, 64 y.o.   MRN: [id number]       CARDIOLOGY CONSULT NOTE  Patient ID: Omar Ruiz MRN: [id number] DOB/AGE: 06/09/1950 64 y.o.  Admit date: (Not on file) Primary Physician Purvis Kilts, MD  Reason for Consultation: CAD with stent  HPI: The patient is a 64 year old male who I am evaluating for the first time. He was previously followed by Dr. Remer Macho with [facility]. He has a history of ischemic heart disease and had an LAD stent placed in 2011 for shortness of breath. He also has a history of hypertension and hyperlipidemia. He reportedly underwent a normal exercise Cardiolite stress test in November 2014, LVEF 58% area did he achieved 7 METS. He has had two episodes of chest pain this year, one which awoke him from sleep and radiated down his left arm. He did not take nitroglycerin but took baby aspirin and this alleviated his symptoms. He has not had a recent lipid panel and is not on statin therapy. Upon review of the EMR, it appears he was unwilling to go for follow-up blood testing and requested that the medication be stopped. He had been on pravastatin last year. His LDL was reportedly 92 in January 2015.   He denies shortness of breath, dizziness, leg swelling, and syncope.  ECG performed in the office today demonstrates normal sinus rhythm with no ischemic ST segment or T-wave abnormalities.  He is here with his wife and his grandson, Alroy Dust.      No Known Allergies  Current Outpatient Prescriptions  Medication Sig Dispense Refill  . aspirin 81 MG tablet 81 mg.    . Cholecalciferol (VITAMIN D-3) 1000 UNITS CAPS Take by mouth daily.    . fish oil-omega-3 fatty acids 1000 MG capsule Take 2 g by mouth daily.    . Fluticasone-Salmeterol (ADVAIR) 250-50 MCG/DOSE AEPB Inhale 1 puff into the lungs daily.    . Glucosamine-Chondroit-Vit C-Mn (GLUCOSAMINE CHONDR 1500 COMPLX PO) Take by mouth.    . meloxicam  (MOBIC) 15 MG tablet Take 15 mg by mouth daily.    . metoprolol succinate (TOPROL-XL) 25 MG 24 hr tablet Take 25 mg by mouth daily.     No current facility-administered medications for this visit.    Past Medical History  Diagnosis Date  . Hyperlipemia   . CAD (coronary artery disease)     Stent in 09/05/2009  . COPD, mild   . Obesity   . Palpitation   . Hypertension 10/2004    stress test EF 57%  . Chest pain 07/2004    2D Echo EF>55%    Past Surgical History  Procedure Laterality Date  . Stents  09/05/2009    3.0x67mm promus drug eluting stent for a 90% mid LAD artery stenosis  . Colonoscopy  10/2004  . Colonoscopy N/A 03/29/2013    Procedure: COLONOSCOPY;  Surgeon: [name], MD;  [facility];  Service: Gastroenterology;  Laterality: N/A;    History   Social History  . Marital Status: Married    Spouse Name: N/A  . Number of Children: N/A  . Years of Education: 12   Occupational History  . Not on file.   Social History Main Topics  . Smoking status: Former Smoker    Quit date: 05/12/1981  . Smokeless tobacco: Never Used  . Alcohol Use: 0.0 oz/week    0 Standard drinks or equivalent per week  . Drug Use:  No  . Sexual Activity: Yes   Other Topics Concern  . Not on file   Social History Narrative     No family history of premature CAD in 1st degree relatives.  Prior to Admission medications   Medication Sig Start Date End Date Taking? Authorizing Provider  aspirin 81 MG tablet 81 mg.    Historical Provider, MD  clopidogrel (PLAVIX) 75 MG tablet Take 75 mg by mouth daily.    Historical Provider, MD  fish oil-omega-3 fatty acids 1000 MG capsule Take 2 g by mouth daily.    Historical Provider, MD  Glucosamine-Chondroit-Vit C-Mn (GLUCOSAMINE CHONDR 1500 COMPLX PO) Take by mouth.    Historical Provider, MD  ibuprofen (ADVIL,MOTRIN) 800 MG tablet Take 1 tablet (800 mg total) by mouth every 8 (eight) hours as needed for pain. 12/16/12   Carole Civil, MD  methocarbamol (ROBAXIN) 500 MG tablet Take 1 tablet (500 mg total) by mouth at bedtime. 12/16/12   Carole Civil, MD  naproxen sodium (ANAPROX) 220 MG tablet     Historical Provider, MD  pravastatin (PRAVACHOL) 40 MG tablet Take 1 tablet (40 mg total) by mouth daily. 12/27/12   Mihai Croitoru, MD     Review of systems complete and found to be negative unless listed above in HPI     Physical exam Blood pressure 122/80, pulse 69, height 6\' 1"  (1.854 m), weight 234 lb (106.142 kg), SpO2 93 %. General: NAD Neck: No JVD, no thyromegaly or thyroid nodule.  Lungs: Clear to auscultation bilaterally with normal respiratory effort. CV: Nondisplaced PMI. Regular rate and rhythm, normal S1/S2, no S3/S4, no murmur.  No peripheral edema.  No carotid bruit.  Normal pedal pulses.  Abdomen: Soft, nontender, no hepatosplenomegaly, no distention.  Skin: Intact without lesions or rashes.  Neurologic: Alert and oriented x 3.  Psych: Normal affect. Extremities: No clubbing or cyanosis.  HEENT: Normal.   ECG: Most recent ECG reviewed.  Labs:   Lab Results  Component Value Date   WBC 6.3 09/05/2009   HGB 13.1 09/05/2009   HCT 38.1* 09/05/2009   MCV 92.4 09/05/2009   PLT 216 09/05/2009   No results for input(s): NA, K, CL, CO2, BUN, CREATININE, CALCIUM, PROT, BILITOT, ALKPHOS, ALT, AST, GLUCOSE in the last 168 hours.  Invalid input(s): LABALBU No results found for: CKTOTAL, CKMB, CKMBINDEX, TROPONINI No results found for: CHOL No results found for: HDL No results found for: LDLCALC No results found for: TRIG No results found for: CHOLHDL No results found for: LDLDIRECT       Studies: No results found.  ASSESSMENT AND PLAN:  1. CAD with LAD stent: Relatively stable ischemic heart disease. Continue ASA and metoprolol. Will add simvastatin 20 mg daily and refill SL nitroglycerin along with metoprolol. I have encouraged him to use SL nitroglycerin if he were to have chest  pain.  2. Essential HTN: Well controlled. No changes.   3. Hyperlipidemia: Will check lipids three months after starting simvastatin 20 mg.   Dispo: f/u 6 months.   Signed: Kate Sable, M.D., F.A.C.C.  08/22/2014, 3:00 PM

## 2014-08-22 NOTE — Patient Instructions (Addendum)
Your physician wants you to follow-up in: 6 months with Dr. Virgina Jock will receive a reminder letter in the mail two months in advance. If you don't receive a letter, please call our office to schedule the follow-up appointment.  Your physician has recommended you make the following change in your medication:   START SIMVASTATIN 20 MG DAILY  WE HAVE REFILLED NITROGLYCERIN AND METOPROLOL   Your physician recommends that you return for lab work in: [place]  Thank you for choosing [facility]!!

## 2014-12-11 ENCOUNTER — Other Ambulatory Visit: Payer: Self-pay | Admitting: *Deleted

## 2014-12-11 ENCOUNTER — Encounter: Payer: Self-pay | Admitting: *Deleted

## 2014-12-11 DIAGNOSIS — E785 Hyperlipidemia, unspecified: Secondary | ICD-10-CM

## 2014-12-26 ENCOUNTER — Telehealth: Payer: Self-pay | Admitting: *Deleted

## 2014-12-26 ENCOUNTER — Encounter: Payer: Self-pay | Admitting: *Deleted

## 2014-12-26 NOTE — Telephone Encounter (Signed)
Pt mailed letter, routed to pcp

## 2014-12-26 NOTE — Telephone Encounter (Signed)
-----   Message from [place] sent at 12/26/2014  7:22 AM EDT -----   ----- Message -----    From: Herminio Commons, MD    Sent: 12/25/2014  11:31 AM      To: Massie Maroon, CMA  Good.

## 2015-02-22 ENCOUNTER — Ambulatory Visit (INDEPENDENT_AMBULATORY_CARE_PROVIDER_SITE_OTHER): Payer: 59 | Admitting: Cardiovascular Disease

## 2015-02-22 VITALS — BP 138/82 | HR 69 | Ht 73.0 in | Wt 241.0 lb

## 2015-02-22 DIAGNOSIS — I10 Essential (primary) hypertension: Secondary | ICD-10-CM | POA: Diagnosis not present

## 2015-02-22 DIAGNOSIS — Z23 Encounter for immunization: Secondary | ICD-10-CM

## 2015-02-22 DIAGNOSIS — Z955 Presence of coronary angioplasty implant and graft: Secondary | ICD-10-CM | POA: Diagnosis not present

## 2015-02-22 DIAGNOSIS — E785 Hyperlipidemia, unspecified: Secondary | ICD-10-CM

## 2015-02-22 DIAGNOSIS — I25118 Atherosclerotic heart disease of native coronary artery with other forms of angina pectoris: Secondary | ICD-10-CM | POA: Diagnosis not present

## 2015-02-22 NOTE — Patient Instructions (Signed)
Your physician wants you to follow-up in: 1 year with Dr Koneswaran You will receive a reminder letter in the mail two months in advance. If you don't receive a letter, please call our office to schedule the follow-up appointment.    Your physician recommends that you continue on your current medications as directed. Please refer to the Current Medication list given to you today.     Thank you for choosing [facility] !        

## 2015-02-22 NOTE — Progress Notes (Signed)
Patient ID: Omar Ruiz, male   DOB: [birth year], 64 y.o.   MRN: [id number]      SUBJECTIVE: The patient presents for follow-up of coronary artery disease. He had a mid LAD stent placed in 2011 for shortness of breath. He also has a history of hypertension and hyperlipidemia. He reportedly underwent a normal exercise Cardiolite stress test in November 2014, LVEF 58%, and achieved 7 METS.  The patient denies any symptoms of chest pain, palpitations, shortness of breath, lightheadedness, dizziness, leg swelling, orthopnea, PND, and syncope.    Review of Systems: As per "subjective", otherwise negative.  No Known Allergies  Current Outpatient Prescriptions  Medication Sig Dispense Refill  . aspirin 81 MG tablet 81 mg.    . Cholecalciferol (VITAMIN D-3) 1000 UNITS CAPS Take by mouth daily.    . fish oil-omega-3 fatty acids 1000 MG capsule Take 2 g by mouth daily.    . Fluticasone-Salmeterol (ADVAIR) 250-50 MCG/DOSE AEPB Inhale 1 puff into the lungs daily.    . Glucosamine-Chondroit-Vit C-Mn (GLUCOSAMINE CHONDR 1500 COMPLX PO) Take by mouth.    . meloxicam (MOBIC) 15 MG tablet Take 15 mg by mouth daily.    . metoprolol succinate (TOPROL-XL) 25 MG 24 hr tablet Take 1 tablet (25 mg total) by mouth daily. 90 tablet 3  . nitroGLYCERIN (NITROSTAT) 0.4 MG SL tablet Place 1 tablet (0.4 mg total) under the tongue every 5 (five) minutes as needed for chest pain. 25 tablet 3  . simvastatin (ZOCOR) 20 MG tablet Take 1 tablet (20 mg total) by mouth at bedtime. 90 tablet 3   No current facility-administered medications for this visit.    Past Medical History  Diagnosis Date  . Hyperlipemia   . CAD (coronary artery disease)     Stent in 09/05/2009  . COPD, mild   . Obesity   . Palpitation   . Hypertension 10/2004    stress test EF 57%  . Chest pain 07/2004    2D Echo EF>55%    Past Surgical History  Procedure Laterality Date  . Stents  09/05/2009    3.0x79mm promus drug eluting stent  for a 90% mid LAD artery stenosis  . Colonoscopy  10/2004  . Colonoscopy N/A 03/29/2013    Procedure: COLONOSCOPY;  Surgeon: [name], MD;  Location: [facility];  Service: Gastroenterology;  Laterality: N/A;    Social History   Social History  . Marital Status: Married    Spouse Name: N/A  . Number of Children: N/A  . Years of Education: 12   Occupational History  . Not on file.   Social History Main Topics  . Smoking status: Former Smoker    Quit date: 05/12/1981  . Smokeless tobacco: Never Used  . Alcohol Use: 0.0 oz/week    0 Standard drinks or equivalent per week  . Drug Use: No  . Sexual Activity: Yes   Other Topics Concern  . Not on file   Social History Narrative     Filed Vitals:   02/22/15 0848  BP: 138/82  Pulse: 69  Height: 6\' 1"  (1.854 m)  Weight: 241 lb (109.317 kg)  SpO2: 95%    PHYSICAL EXAM General: NAD HEENT: Normal. Neck: No JVD, no thyromegaly. Lungs: Clear to auscultation bilaterally with normal respiratory effort. CV: Nondisplaced PMI.  Regular rate and rhythm, normal S1/S2, no S3/S4, no murmur. No pretibial or periankle edema.  No carotid bruit.  Normal pedal pulses.  Abdomen: Soft, nontender, no hepatosplenomegaly, no  distention.  Neurologic: Alert and oriented x 3.  Psych: Normal affect. Skin: Normal. Musculoskeletal: Normal range of motion, no gross deformities. Extremities: No clubbing or cyanosis.   ECG: Most recent ECG reviewed.      ASSESSMENT AND PLAN: 1. CAD with LAD stent: Stable ischemic heart disease. Continue ASA, beta blocker, and statin therapy.  2. Essential HTN: Well controlled. No changes.   3. Hyperlipidemia: On 12/19/14, total cholesterol 148, triglycerides 102, HDL 50, LDL 78. Continue simvastatin 20 mg.   Dispo: f/u 1 year.  Kate Sable, M.D., F.A.C.C.

## 2015-03-14 ENCOUNTER — Telehealth: Payer: Self-pay | Admitting: *Deleted

## 2015-03-14 NOTE — Telephone Encounter (Signed)
No reason to stop given nature of surgery. If he prefers to do so, would stop 2 days prior and commence as soon as feasible.

## 2015-03-14 NOTE — Telephone Encounter (Signed)
Pending surgery for sinus / septoplasty on 03/26/2015 - needs to know when to stop ASA.

## 2015-03-14 NOTE — Telephone Encounter (Signed)
Noted.  Will fax note to [facility].

## 2015-03-26 ENCOUNTER — Other Ambulatory Visit: Payer: Self-pay | Admitting: Otolaryngology

## 2015-09-03 ENCOUNTER — Other Ambulatory Visit: Payer: Self-pay

## 2015-09-03 MED ORDER — METOPROLOL SUCCINATE ER 25 MG PO TB24: 25.0000 mg | ORAL_TABLET | Freq: Every day | ORAL | Status: DC

## 2015-09-03 NOTE — Telephone Encounter (Signed)
Metoprolol refilled.

## 2015-09-07 ENCOUNTER — Other Ambulatory Visit: Payer: Self-pay | Admitting: *Deleted

## 2015-09-07 MED ORDER — SIMVASTATIN 20 MG PO TABS: 20.0000 mg | ORAL_TABLET | Freq: Every day | ORAL | Status: DC

## 2015-09-10 ENCOUNTER — Other Ambulatory Visit: Payer: Self-pay

## 2015-09-10 MED ORDER — SIMVASTATIN 20 MG PO TABS: 20.0000 mg | ORAL_TABLET | Freq: Every day | ORAL | Status: DC

## 2015-09-10 NOTE — Telephone Encounter (Signed)
Refill complete 

## 2015-09-13 ENCOUNTER — Other Ambulatory Visit (HOSPITAL_COMMUNITY): Payer: Self-pay | Admitting: Physician Assistant

## 2015-09-13 ENCOUNTER — Ambulatory Visit (HOSPITAL_COMMUNITY)
Admission: RE | Admit: 2015-09-13 | Discharge: 2015-09-13 | Disposition: A | Discharge status: Home / Self Care | Payer: 59 | Source: Ambulatory Visit | Attending: Physician Assistant | Admitting: Physician Assistant

## 2015-09-13 DIAGNOSIS — Z1389 Encounter for screening for other disorder: Secondary | ICD-10-CM | POA: Insufficient documentation

## 2015-09-13 DIAGNOSIS — Z0001 Encounter for general adult medical examination with abnormal findings: Secondary | ICD-10-CM

## 2016-04-01 ENCOUNTER — Encounter: Payer: Self-pay | Admitting: Cardiovascular Disease

## 2016-04-01 ENCOUNTER — Ambulatory Visit (INDEPENDENT_AMBULATORY_CARE_PROVIDER_SITE_OTHER): Payer: 59 | Admitting: Cardiovascular Disease

## 2016-04-01 VITALS — BP 120/56 | HR 70 | Ht 73.0 in | Wt 247.0 lb

## 2016-04-01 DIAGNOSIS — E7800 Pure hypercholesterolemia, unspecified: Secondary | ICD-10-CM | POA: Diagnosis not present

## 2016-04-01 DIAGNOSIS — I10 Essential (primary) hypertension: Secondary | ICD-10-CM | POA: Diagnosis not present

## 2016-04-01 DIAGNOSIS — I25118 Atherosclerotic heart disease of native coronary artery with other forms of angina pectoris: Secondary | ICD-10-CM

## 2016-04-01 DIAGNOSIS — Z955 Presence of coronary angioplasty implant and graft: Secondary | ICD-10-CM

## 2016-04-01 NOTE — Patient Instructions (Signed)
Your physician wants you to follow-up in: 1 year Dr Koneswaran You will receive a reminder letter in the mail two months in advance. If you don't receive a letter, please call our office to schedule the follow-up appointment.     Your physician recommends that you continue on your current medications as directed. Please refer to the Current Medication list given to you today.    If you need a refill on your cardiac medications before your next appointment, please call your pharmacy.      Thank you for choosing [facility] !         

## 2016-04-01 NOTE — Progress Notes (Signed)
SUBJECTIVE: The patient presents for follow-up of coronary artery disease. He had a mid LAD stent placed in 2011 for shortness of breath. He also has a history of hypertension and hyperlipidemia. He reportedly underwent a normal exercise Cardiolite stress test in November 2014, LVEF 58%, and achieved 7 METS.  The patient denies any symptoms of chest pain, palpitations, shortness of breath, lightheadedness, dizziness, leg swelling, orthopnea, PND, and syncope.  Lipids 03/17/16: Total cholesterol 137, HDL 46, triglycerides 73, LDL 76.  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  Review of Systems: As per "subjective", otherwise negative.  No Known Allergies  Current Outpatient Prescriptions  Medication Sig Dispense Refill  . aspirin 81 MG tablet 81 mg.    . Cholecalciferol (VITAMIN D-3) 1000 UNITS CAPS Take by mouth daily.    . fish oil-omega-3 fatty acids 1000 MG capsule Take 2 g by mouth daily.    . Fluticasone-Salmeterol (ADVAIR) 250-50 MCG/DOSE AEPB Inhale 1 puff into the lungs daily.    . Glucosamine-Chondroit-Vit C-Mn (GLUCOSAMINE CHONDR 1500 COMPLX PO) Take by mouth.    . meloxicam (MOBIC) 15 MG tablet Take 15 mg by mouth daily.    . metoprolol succinate (TOPROL-XL) 25 MG 24 hr tablet Take 1 tablet (25 mg total) by mouth daily. 90 tablet 3  . nitroGLYCERIN (NITROSTAT) 0.4 MG SL tablet Place 1 tablet (0.4 mg total) under the tongue every 5 (five) minutes as needed for chest pain. 25 tablet 3  . simvastatin (ZOCOR) 20 MG tablet Take 1 tablet (20 mg total) by mouth at bedtime. 90 tablet 3   No current facility-administered medications for this visit.     Past Medical History:  Diagnosis Date  . CAD (coronary artery disease)    Stent in 09/05/2009  . Chest pain 07/2004   2D Echo EF>55%  . COPD, mild ([place])   . Hyperlipemia   . Hypertension 10/2004   stress test EF 57%  . Obesity   . Palpitation      Past Surgical History:  Procedure Laterality Date  . COLONOSCOPY  10/2004  . COLONOSCOPY N/A 03/29/2013   Procedure: COLONOSCOPY;  Surgeon: [name], MD;  Location: [facility];  Service: Gastroenterology;  Laterality: N/A;  . STENTS  09/05/2009   3.0x51mm promus drug eluting stent for a 90% mid LAD artery stenosis    Social History   Social History  . Marital status: Married    Spouse name: N/A  . Number of children: N/A  . Years of education: 26   Occupational History  . Not on file.   Social History Main Topics  . Smoking status: Former Smoker    Quit date: 05/12/1981  . Smokeless tobacco: Never Used  . Alcohol use 0.0 oz/week  . Drug use: No  . Sexual activity: Yes   Other Topics Concern  . Not on file   Social History Narrative  . No narrative on file     Vitals:   04/01/16 1536  BP: (!) 120/56  Pulse: 70  SpO2: 96%  Weight: 247 lb (112 kg)  Height: 6\' 1"  (1.854 m)    PHYSICAL EXAM General: NAD HEENT: Normal. Neck: No JVD, no thyromegaly. Lungs: Clear to auscultation bilaterally with normal respiratory effort. CV: Nondisplaced PMI.  Regular rate and rhythm, normal S1/S2, no S3/S4, no murmur. No pretibial or periankle edema.  No carotid bruit.   Abdomen: Soft, nontender, no  distention.  Neurologic: Alert and oriented.  Psych: Normal affect. Skin: Normal. Musculoskeletal: No gross deformities.    ECG: Most recent ECG reviewed.      ASSESSMENT AND PLAN: 1. CAD with LAD stent: Stable ischemic heart disease. Continue ASA, beta blocker, and statin therapy.  2. Essential HTN: Well controlled. No changes.   3. Hyperlipidemia: Lipids 03/17/16: Total cholesterol 137, HDL 46, triglycerides 73, LDL 76.Continue simvastatin 20 mg.   Dispo: f/u 1 year.   Kate Sable, M.D., F.A.C.C.

## 2016-08-20 ENCOUNTER — Other Ambulatory Visit: Payer: Self-pay | Admitting: Cardiovascular Disease

## 2016-09-18 ENCOUNTER — Other Ambulatory Visit: Payer: Self-pay | Admitting: Cardiovascular Disease

## 2016-10-08 ENCOUNTER — Ambulatory Visit (HOSPITAL_COMMUNITY)
Admission: RE | Admit: 2016-10-08 | Discharge: 2016-10-08 | Disposition: A | Discharge status: Home / Self Care | Payer: 59 | Source: Ambulatory Visit | Attending: Adult Health Nurse Practitioner | Admitting: Adult Health Nurse Practitioner

## 2016-10-08 ENCOUNTER — Other Ambulatory Visit (HOSPITAL_COMMUNITY): Payer: Self-pay | Admitting: Adult Health Nurse Practitioner

## 2016-10-08 DIAGNOSIS — M25511 Pain in right shoulder: Secondary | ICD-10-CM

## 2016-10-08 DIAGNOSIS — M19011 Primary osteoarthritis, right shoulder: Secondary | ICD-10-CM | POA: Diagnosis not present

## 2016-10-17 DIAGNOSIS — J320 Chronic maxillary sinusitis: Secondary | ICD-10-CM | POA: Insufficient documentation

## 2016-10-21 ENCOUNTER — Encounter: Payer: Self-pay | Admitting: Orthopedic Surgery

## 2016-10-21 ENCOUNTER — Ambulatory Visit (INDEPENDENT_AMBULATORY_CARE_PROVIDER_SITE_OTHER): Payer: 59 | Admitting: Orthopedic Surgery

## 2016-10-21 VITALS — BP 121/79 | HR 61 | Ht 73.0 in | Wt 242.0 lb

## 2016-10-21 DIAGNOSIS — M25511 Pain in right shoulder: Secondary | ICD-10-CM

## 2016-10-21 NOTE — Progress Notes (Signed)
  NEW PATIENT OFFICE VISIT    Chief Complaint  Patient presents with  . New Patient (Initial Visit)    Right Shoulder pain    66 year old male presents with new onset pain over the right scapula with some radiation into the right arm described as burning constant moderate to severe pain    Review of Systems  Constitutional: Negative for chills and fever.  Skin: Negative for rash.  Neurological: Positive for tingling. Negative for focal weakness.     Past Medical History:  Diagnosis Date  . Asthma   . CAD (coronary artery disease)    Stent in 09/05/2009  . Chest pain 07/2004   2D Echo EF>55%  . COPD (chronic obstructive pulmonary disease) ([place])   . COPD, mild ([place])   . Hyperlipemia   . Hypertension 10/2004   stress test EF 57%  . Obesity   . Palpitation     Past Surgical History:  Procedure Laterality Date  . COLONOSCOPY  10/2004  . COLONOSCOPY N/A 03/29/2013   Procedure: COLONOSCOPY;  Surgeon: [name], MD;  [facility];  Service: Gastroenterology;  Laterality: N/A;  . STENTS  09/05/2009   3.0x58mm promus drug eluting stent for a 90% mid LAD artery stenosis    Family History  Problem Relation Age of Onset  . Diabetes Mother   . Heart disease Father   . Lung disease Unknown   . Asthma Unknown   . Diabetes Unknown   . CAD Brother        3 brothers hx of cad   Social History  Substance Use Topics  . Smoking status: Former Smoker    Quit date: 05/12/1981  . Smokeless tobacco: Never Used  . Alcohol use 0.0 oz/week    BP 121/79   Pulse 61   Ht 6\' 1"  (1.854 m)   Wt 242 lb (109.8 kg)   BMI 31.93 kg/m   Physical Exam  Constitutional: He appears well-developed and well-nourished.  Vital signs have been reviewed and are stable. Gen. appearance the patient is well-developed and well-nourished with normal grooming and hygiene. The patient is oriented 3 with normal mood and affect.  Vitals reviewed.   Ortho Exam  On the right shoulder  inspection reveals tenderness over the posterior medial border of the scapula with reproducible tenderness his external rotation with his arm at his side is 50 he has flexion of 150 with some pain stability normal motor exam of the cuff normal pulse and sensation normal skin intact  Left shoulder nontender  Thoracic spine and cervical spine nontender to palpation Meds ordered this encounter  Medications  . metoprolol succinate (TOPROL-XL) 25 MG 24 hr tablet    Sig: Take by mouth.    Encounter Diagnosis  Name Primary?  . Trigger point of right shoulder region Yes     PLAN:   Recommend injection of the trigger point  [place]  Patient consented verbally for injection of the RIGHT  posterior/MEDIAL scapula. Timeout confirmed the site of injection A steroid injection was performed at inferior border of the RIGHT POSTERIOR MEDIAL  scapula at the point of maximal tenderness using 1% plain Lidocaine and 40 mg of Depo-Medrol. This was well tolerated.

## 2016-11-07 ENCOUNTER — Ambulatory Visit: Payer: 59 | Admitting: Orthopedic Surgery

## 2017-01-26 ENCOUNTER — Other Ambulatory Visit: Payer: Self-pay

## 2017-01-26 ENCOUNTER — Telehealth: Payer: Self-pay | Admitting: Cardiovascular Disease

## 2017-01-26 MED ORDER — METOPROLOL SUCCINATE ER 25 MG PO TB24: 25.0000 mg | ORAL_TABLET | Freq: Every day | ORAL | 3 refills | Status: DC

## 2017-01-26 NOTE — Telephone Encounter (Signed)
°*  STAT* If patient is at the pharmacy, call can be transferred to refill team.   1. Which medications need to be refilled? (please list name of each medication and dose if known)  metoprolol succinate (TOPROL-XL) 25 MG 24 hr tablet [[id number]]   2. Which pharmacy/location (including street and city if local pharmacy) is medication to be sent to? [facility]  3. Do they need a 30 day or 90 day supply?  90 day  Scheduled to see Koneswaran on 02/16/17

## 2017-02-16 ENCOUNTER — Encounter: Payer: Self-pay | Admitting: Cardiovascular Disease

## 2017-02-16 ENCOUNTER — Ambulatory Visit (INDEPENDENT_AMBULATORY_CARE_PROVIDER_SITE_OTHER): Payer: 59 | Admitting: Cardiovascular Disease

## 2017-02-16 VITALS — BP 150/98 | HR 70 | Wt 252.0 lb

## 2017-02-16 DIAGNOSIS — I25119 Atherosclerotic heart disease of native coronary artery with unspecified angina pectoris: Secondary | ICD-10-CM

## 2017-02-16 DIAGNOSIS — I209 Angina pectoris, unspecified: Secondary | ICD-10-CM

## 2017-02-16 DIAGNOSIS — I10 Essential (primary) hypertension: Secondary | ICD-10-CM

## 2017-02-16 DIAGNOSIS — R0609 Other forms of dyspnea: Secondary | ICD-10-CM | POA: Diagnosis not present

## 2017-02-16 DIAGNOSIS — Z955 Presence of coronary angioplasty implant and graft: Secondary | ICD-10-CM | POA: Diagnosis not present

## 2017-02-16 DIAGNOSIS — Z136 Encounter for screening for cardiovascular disorders: Secondary | ICD-10-CM

## 2017-02-16 DIAGNOSIS — I2510 Atherosclerotic heart disease of native coronary artery without angina pectoris: Secondary | ICD-10-CM | POA: Diagnosis not present

## 2017-02-16 DIAGNOSIS — R079 Chest pain, unspecified: Secondary | ICD-10-CM

## 2017-02-16 DIAGNOSIS — E785 Hyperlipidemia, unspecified: Secondary | ICD-10-CM

## 2017-02-16 NOTE — Patient Instructions (Signed)
Your physician recommends that you schedule a follow-up appointment in: 3 WEEKS with [facility] has requested that you have en exercise stress myoview. For further information please visit [email or web address]. Please follow instruction sheet, as given H OLD METOPROLOL THE MORNING OF TEST    Your physician recommends that you continue on your current medications as directed. Please refer to the Current Medication list given to you today.  GET fasting LIPID PROFILE      Thank you for choosing [facility] !

## 2017-02-16 NOTE — Progress Notes (Signed)
SUBJECTIVE: The patient presents for follow-up of coronary artery disease. He had a mid LAD stent placed in 2011 for shortness of breath. He also has a history of hypertension and hyperlipidemia. He reportedly underwent a normal exercise Cardiolite stress test in November 2014, LVEF 58%, and achieved 7 METS.  ECG performed in the office today which I ordered and personally interpreted demonstrates normal sinus rhythm with no ischemic ST segment or T-wave abnormalities, nor any arrhythmias.  For the past 6 months he has been experiencing increasing exertional dyspnea. He has also been experiencing episodic chest pain. He has not taken nitroglycerin. Last week he had an episode of chest pain accompanied by diaphoresis while he was driving.  He went to the [employer] for his physical last week and his blood pressure was mildly elevated. They recommended a stress test every 2 years.  After walking the dog the other day he had some increasing shortness of breath.  His wife says he has been more angry and irritable over the past 6 months. He denies being depressed and still enjoys regular activities.  Coronary angiography on 09/04/2009 demonstrated a proximal 20% LAD stenosis and a proximal left circumflex 20% stenosis. The RCA was normal. He underwent PCI at that time for a mid LAD 90% stenosis.  He has been using CPAP for the past year.   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  Review of Systems: As per "subjective", otherwise negative.  No Known Allergies  Current Outpatient Prescriptions  Medication Sig Dispense Refill  . aspirin 81 MG tablet Take 81 mg by mouth daily.     . Cholecalciferol (VITAMIN D-3) 1000 UNITS CAPS Take by mouth daily.    . fish oil-omega-3 fatty acids 1000 MG capsule Take 2 g by mouth daily.    . Fluticasone-Salmeterol (ADVAIR) 250-50 MCG/DOSE AEPB Inhale 1 puff into the lungs daily.    .  Glucosamine-Chondroit-Vit C-Mn (GLUCOSAMINE CHONDR 1500 COMPLX PO) Take by mouth.    . meloxicam (MOBIC) 15 MG tablet Take 15 mg by mouth daily.    . metoprolol succinate (TOPROL-XL) 25 MG 24 hr tablet Take 1 tablet (25 mg total) by mouth daily. 90 tablet 3  . nitroGLYCERIN (NITROSTAT) 0.4 MG SL tablet Place 1 tablet (0.4 mg total) under the tongue every 5 (five) minutes as needed for chest pain. 25 tablet 3  . simvastatin (ZOCOR) 20 MG tablet TAKE ONE TABLET BY MOUTH AT BEDTIME 90 tablet 3   No current facility-administered medications for this visit.     Past Medical History:  Diagnosis Date  . Asthma   . CAD (coronary artery disease)    Stent in 09/05/2009  . Chest pain 07/2004   2D Echo EF>55%  . COPD (chronic obstructive pulmonary disease) ([place])   . COPD, mild ([place])   . Hyperlipemia   . Hypertension 10/2004   stress test EF 57%  . Obesity   . Palpitation     Past Surgical History:  Procedure Laterality Date  . COLONOSCOPY  10/2004  . COLONOSCOPY N/A 03/29/2013   Procedure: COLONOSCOPY;  Surgeon: [name], MD;  [facility];  Service: Gastroenterology;  Laterality: N/A;  . STENTS  09/05/2009   3.0x4mm promus drug eluting stent for a 90% mid LAD artery stenosis    Social History   Social History  . Marital status: Married    Spouse name: N/A  .  Number of children: N/A  . Years of education: 39   Occupational History  . Not on file.   Social History Main Topics  . Smoking status: Former Smoker    Quit date: 05/12/1981  . Smokeless tobacco: Never Used  . Alcohol use 0.0 oz/week  . Drug use: No  . Sexual activity: Yes   Other Topics Concern  . Not on file   Social History Narrative  . No narrative on file     Vitals:   02/16/17 0946  BP: (!) 150/98  Pulse: 70  SpO2: 94%  Weight: 252 lb (114.3 kg)    Wt Readings from Last 3 Encounters:  02/16/17 252 lb (114.3 kg)  10/21/16 242 lb (109.8 kg)  04/01/16 247 lb (112 kg)      PHYSICAL EXAM General: NAD HEENT: Normal. Neck: No JVD, no thyromegaly. Lungs: Clear to auscultation bilaterally with normal respiratory effort. CV: Nondisplaced PMI.  Regular rate and rhythm, normal S1/S2, no S3/S4, no murmur. No pretibial or periankle edema.  No carotid bruit.   Abdomen: Soft, nontender, no distention.  Neurologic: Alert and oriented.  Psych: Normal affect. Skin: Normal. Musculoskeletal: No gross deformities.    ECG: Most recent ECG reviewed.   Labs: Lab Results  Component Value Date/Time   K 3.6 09/05/2009 06:33 AM   BUN 7 09/05/2009 06:33 AM   CREATININE 0.92 09/05/2009 06:33 AM   HGB 13.1 09/05/2009 06:33 AM     Lipids: No results found for: LDLCALC, LDLDIRECT, CHOL, TRIG, HDL     ASSESSMENT AND PLAN: 1. CAD with LAD stent with increasing chest pain and exertional dyspnea consistent with angina pectoris: I will obtain an exercise Myoview stress test. Continue ASA, beta blocker, and statin therapy. I will hold metoprolol the day before his study.  2. Essential HTN: Elevated. If it remains elevated at his next visit, I will adjust his medications.  3. Hyperlipidemia: I will check lipids. Continue simvastatin 20 mg.      Disposition: Follow up 3 weeks.   Kate Sable, M.D., F.A.C.C.

## 2017-02-20 ENCOUNTER — Ambulatory Visit (HOSPITAL_COMMUNITY): Payer: 59

## 2017-02-20 ENCOUNTER — Encounter (HOSPITAL_COMMUNITY): Payer: Self-pay

## 2017-02-23 ENCOUNTER — Encounter (HOSPITAL_BASED_OUTPATIENT_CLINIC_OR_DEPARTMENT_OTHER)
Admission: RE | Admit: 2017-02-23 | Discharge: 2017-02-23 | Disposition: A | Discharge status: Home / Self Care | Payer: 59 | Source: Ambulatory Visit | Attending: Cardiovascular Disease | Admitting: Cardiovascular Disease

## 2017-02-23 ENCOUNTER — Encounter (HOSPITAL_COMMUNITY): Payer: Self-pay

## 2017-02-23 ENCOUNTER — Encounter (HOSPITAL_COMMUNITY)
Admission: RE | Admit: 2017-02-23 | Discharge: 2017-02-23 | Disposition: A | Discharge status: Home / Self Care | Payer: 59 | Source: Ambulatory Visit | Attending: Cardiovascular Disease | Admitting: Cardiovascular Disease

## 2017-02-23 ENCOUNTER — Other Ambulatory Visit (HOSPITAL_COMMUNITY)
Admission: RE | Admit: 2017-02-23 | Discharge: 2017-02-23 | Disposition: A | Discharge status: Home / Self Care | Payer: 59 | Source: Ambulatory Visit | Attending: Cardiovascular Disease | Admitting: Cardiovascular Disease

## 2017-02-23 DIAGNOSIS — I209 Angina pectoris, unspecified: Secondary | ICD-10-CM | POA: Diagnosis present

## 2017-02-23 LAB — NM MYOCAR MULTI W/SPECT W/WALL MOTION / EF
CHL CUP RESTING HR STRESS: 62 {beats}/min
CSEPPHR: 144 {beats}/min
Estimated workload: 10.1 METS
Exercise duration (min): 7 min
Exercise duration (sec): 1 s
LVDIAVOL: 124 mL (ref 62–150)
LVSYSVOL: 46 mL
MPHR: 156 {beats}/min
Percent HR: 92 %
RATE: 0.34
RPE: 10
SDS: 3
SRS: 3
SSS: 6
TID: 0.99

## 2017-02-23 LAB — LIPID PANEL
CHOL/HDL RATIO: 3.1 ratio
Cholesterol: 135 mg/dL (ref 0–200)
HDL: 44 mg/dL (ref >40)
LDL Cholesterol: 72 mg/dL (ref 0–99)
Triglycerides: 97 mg/dL (ref <150)
VLDL: 19 mg/dL (ref 0–40)

## 2017-02-23 MED ORDER — SODIUM CHLORIDE 0.9% FLUSH
INTRAVENOUS | Status: AC
  Administered 2017-02-23: 10 mL via INTRAVENOUS
  Filled 2017-02-23: qty 10

## 2017-02-23 MED ORDER — REGADENOSON 0.4 MG/5ML IV SOLN
INTRAVENOUS | Status: AC
  Filled 2017-02-23: qty 5

## 2017-02-23 MED ORDER — TECHNETIUM TC 99M TETROFOSMIN IV KIT
10.0000 | PACK | Freq: Once | INTRAVENOUS | Status: AC | PRN
  Administered 2017-02-23: 10.9 via INTRAVENOUS

## 2017-02-23 MED ORDER — TECHNETIUM TC 99M TETROFOSMIN IV KIT
30.0000 | PACK | Freq: Once | INTRAVENOUS | Status: AC | PRN
  Administered 2017-02-23: 30.9 via INTRAVENOUS

## 2017-03-13 ENCOUNTER — Encounter: Payer: Self-pay | Admitting: Cardiovascular Disease

## 2017-03-13 ENCOUNTER — Ambulatory Visit (INDEPENDENT_AMBULATORY_CARE_PROVIDER_SITE_OTHER): Payer: 59 | Admitting: Cardiovascular Disease

## 2017-03-13 VITALS — BP 118/74 | HR 76 | Ht 73.0 in | Wt 250.0 lb

## 2017-03-13 DIAGNOSIS — I25118 Atherosclerotic heart disease of native coronary artery with other forms of angina pectoris: Secondary | ICD-10-CM

## 2017-03-13 DIAGNOSIS — Z87891 Personal history of nicotine dependence: Secondary | ICD-10-CM

## 2017-03-13 DIAGNOSIS — I10 Essential (primary) hypertension: Secondary | ICD-10-CM | POA: Diagnosis not present

## 2017-03-13 DIAGNOSIS — E7800 Pure hypercholesterolemia, unspecified: Secondary | ICD-10-CM | POA: Diagnosis not present

## 2017-03-13 DIAGNOSIS — R0602 Shortness of breath: Secondary | ICD-10-CM | POA: Diagnosis not present

## 2017-03-13 DIAGNOSIS — Z955 Presence of coronary angioplasty implant and graft: Secondary | ICD-10-CM | POA: Diagnosis not present

## 2017-03-13 NOTE — Progress Notes (Signed)
SUBJECTIVE: The patient returns for follow-up after undergoing cardiovascular testing performed for the evaluation of chest pain.  He underwent a low low risk nuclear stress test on 02/23/17.  He had a low risk Duke treadmill score of 7.  There is no evidence of myocardial ischemia or scar.  Coronary angiography on 09/04/2009 demonstrated a proximal 20% LAD stenosis and a proximal left circumflex 20% stenosis. The RCA was normal. He underwent PCI at that time for a mid LAD 90% stenosis. He also has a history of hypertension and hyperlipidemia.  He is here with his wife of 21 years.  She tells me that since he turned 5, he has been more anxious.  She said he has become more irritable and little things make him more upset.  He is anxious about his health.  He has chest pains if he gets anxious or upset.  He was prescribed something for this by his PCP but he did not take it.  He has sleep apnea and uses CPAP.  He quit smoking in 1983.   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    Review of Systems: As per "subjective", otherwise negative.  No Known Allergies  Current Outpatient Prescriptions  Medication Sig Dispense Refill  . aspirin 81 MG tablet Take 81 mg by mouth daily.     . Cholecalciferol (VITAMIN D-3) 1000 UNITS CAPS Take by mouth daily.    . fish oil-omega-3 fatty acids 1000 MG capsule Take 2 g by mouth daily.    . Fluticasone-Salmeterol (ADVAIR) 250-50 MCG/DOSE AEPB Inhale 1 puff into the lungs daily.    . Glucosamine-Chondroit-Vit C-Mn (GLUCOSAMINE CHONDR 1500 COMPLX PO) Take by mouth.    . meloxicam (MOBIC) 15 MG tablet Take 15 mg by mouth daily.    . metoprolol succinate (TOPROL-XL) 25 MG 24 hr tablet Take 1 tablet (25 mg total) by mouth daily. 90 tablet 3  . nitroGLYCERIN (NITROSTAT) 0.4 MG SL tablet Place 1 tablet (0.4 mg total) under the tongue every 5 (five) minutes as needed for chest  pain. 25 tablet 3  . simvastatin (ZOCOR) 20 MG tablet TAKE ONE TABLET BY MOUTH AT BEDTIME 90 tablet 3   No current facility-administered medications for this visit.     Past Medical History:  Diagnosis Date  . Asthma   . CAD (coronary artery disease)    Stent in 09/05/2009  . Chest pain 07/2004   2D Echo EF>55%  . COPD (chronic obstructive pulmonary disease) ([place])   . COPD, mild ([place])   . Hyperlipemia   . Hypertension 10/2004   stress test EF 57%  . Obesity   . Palpitation     Past Surgical History:  Procedure Laterality Date  . COLONOSCOPY  10/2004  . COLONOSCOPY N/A 03/29/2013   Procedure: COLONOSCOPY;  Surgeon: [name], MD;  [facility];  Service: Gastroenterology;  Laterality: N/A;  . STENTS  09/05/2009   3.0x71mm promus drug eluting stent for a 90% mid LAD artery stenosis    Social History   Social History  . Marital status: Married    Spouse name: N/A  . Number of children: N/A  . Years of education: 27   Occupational History  . Not on file.   Social History Main Topics  . Smoking status: Former Smoker    Quit date: 05/12/1981  . Smokeless tobacco: Never Used  . Alcohol use  0.0 oz/week  . Drug use: No  . Sexual activity: Yes   Other Topics Concern  . Not on file   Social History Narrative  . No narrative on file     Vitals:   03/13/17 1033  BP: 118/74  Pulse: 76  SpO2: 94%  Weight: 250 lb (113.4 kg)  Height: 6\' 1"  (1.854 m)    Wt Readings from Last 3 Encounters:  03/13/17 250 lb (113.4 kg)  02/16/17 252 lb (114.3 kg)  10/21/16 242 lb (109.8 kg)     PHYSICAL EXAM General: NAD HEENT: Normal. Neck: No JVD, no thyromegaly. Lungs: Clear to auscultation bilaterally with normal respiratory effort. CV: Regular rate and rhythm, normal S1/S2, no S3/S4, no murmur. No pretibial or periankle edema.  No carotid bruit.   Abdomen: Soft, nontender, no distention.  Neurologic: Alert and oriented.  Psych: Normal affect. Skin:  Normal. Musculoskeletal: No gross deformities.    ECG: Most recent ECG reviewed.   Labs: Lab Results  Component Value Date/Time   K 3.6 09/05/2009 06:33 AM   BUN 7 09/05/2009 06:33 AM   CREATININE 0.92 09/05/2009 06:33 AM   HGB 13.1 09/05/2009 06:33 AM     Lipids: Lab Results  Component Value Date/Time   LDLCALC 72 02/23/2017 08:05 AM   CHOL 135 02/23/2017 08:05 AM   TRIG 97 02/23/2017 08:05 AM   HDL 44 02/23/2017 08:05 AM       ASSESSMENT AND PLAN:  1. CAD with LAD stent: Nuclear stress test was normal as noted above.  Low risk Duke treadmill score portends a low risk of major adverse cardiac events.  Continue present therapy with aspirin, metoprolol succinate, and simvastatin.  2. Essential HTN: Controlled on present therapy.  No changes.  3. Hyperlipidemia: Lipid panel 02/23/17 showed total cholesterol 135, triglycerides 97, HDL 44, LDL 72. Continue simvastatin 20 mg.  4.  Dyspnea on exertion with former history of tobacco use: I will obtain pulmonary function testing.    Disposition: Follow up 6 months.   Kate Sable, M.D., F.A.C.C.

## 2017-03-13 NOTE — Patient Instructions (Signed)
Your physician wants you to follow-up in: 6 months with Dr.Koneswaran You will receive a reminder letter in the mail two months in advance. If you don't receive a letter, please call our office to schedule the follow-up appointment.    Your physician has recommended that you have a pulmonary function test. Pulmonary Function Tests are a group of tests that measure how well air moves in and out of your lungs.     Your physician recommends that you continue on your current medications as directed. Please refer to the Current Medication list given to you today.    If you need a refill on your cardiac medications before your next appointment, please call your pharmacy.     No lab work ordered today.      Thank you for choosing [facility] !

## 2017-03-18 ENCOUNTER — Ambulatory Visit (HOSPITAL_COMMUNITY)
Admission: RE | Admit: 2017-03-18 | Discharge: 2017-03-18 | Disposition: A | Discharge status: Home / Self Care | Payer: 59 | Source: Ambulatory Visit | Attending: Cardiovascular Disease | Admitting: Cardiovascular Disease

## 2017-03-18 DIAGNOSIS — R0602 Shortness of breath: Secondary | ICD-10-CM | POA: Insufficient documentation

## 2017-03-18 LAB — PULMONARY FUNCTION TEST
DL/VA % pred: 93 %
DL/VA: 4.48 ml/min/mmHg/L
DLCO COR % PRED: 79 %
DLCO UNC % PRED: 79 %
DLCO UNC: 28.86 ml/min/mmHg
DLCO cor: 28.86 ml/min/mmHg
FEF 25-75 Pre: 3.34 L/sec
FEF2575-%Pred-Pre: 110 %
FEV1-%PRED-PRE: 87 %
FEV1-Pre: 3.38 L
FEV1FVC-%PRED-PRE: 107 %
FEV6-%Pred-Pre: 85 %
FEV6-PRE: 4.17 L
FEV6FVC-%PRED-PRE: 105 %
FVC-%Pred-Pre: 81 %
FVC-Pre: 4.17 L
PRE FEV1/FVC RATIO: 81 %
Pre FEV6/FVC Ratio: 100 %
RV % PRED: 116 %
RV: 2.91 L
TLC % pred: 94 %
TLC: 7.19 L

## 2017-03-19 ENCOUNTER — Telehealth: Payer: Self-pay

## 2017-03-19 DIAGNOSIS — I272 Pulmonary hypertension, unspecified: Secondary | ICD-10-CM

## 2017-03-19 NOTE — Telephone Encounter (Signed)
Wife notified, echo on for 03/23/17 at 11:30 am, pt will have cbc done that day too.

## 2017-03-19 NOTE — Telephone Encounter (Signed)
-----   Message from Laurine Blazer, LPN sent at 88/11/1957  9:21 AM EST -----   ----- Message ----- From: Herminio Commons, MD Sent: 03/18/2017   5:25 PM To: Laurine Blazer, LPN  No COPD. Possibly some mild pulmonary hypertension. Obtain CBC and echocardiogram to assess pulmonary pressures.

## 2017-03-23 ENCOUNTER — Ambulatory Visit (HOSPITAL_COMMUNITY)
Admission: RE | Admit: 2017-03-23 | Discharge: 2017-03-23 | Disposition: A | Discharge status: Home / Self Care | Payer: 59 | Source: Ambulatory Visit | Attending: Cardiovascular Disease | Admitting: Cardiovascular Disease

## 2017-03-23 ENCOUNTER — Other Ambulatory Visit (HOSPITAL_COMMUNITY)
Admission: RE | Admit: 2017-03-23 | Discharge: 2017-03-23 | Disposition: A | Discharge status: Home / Self Care | Payer: 59 | Source: Ambulatory Visit | Attending: Cardiovascular Disease | Admitting: Cardiovascular Disease

## 2017-03-23 DIAGNOSIS — J449 Chronic obstructive pulmonary disease, unspecified: Secondary | ICD-10-CM | POA: Insufficient documentation

## 2017-03-23 DIAGNOSIS — I272 Pulmonary hypertension, unspecified: Secondary | ICD-10-CM | POA: Insufficient documentation

## 2017-03-23 DIAGNOSIS — I10 Essential (primary) hypertension: Secondary | ICD-10-CM | POA: Insufficient documentation

## 2017-03-23 DIAGNOSIS — E785 Hyperlipidemia, unspecified: Secondary | ICD-10-CM | POA: Diagnosis not present

## 2017-03-23 DIAGNOSIS — I2510 Atherosclerotic heart disease of native coronary artery without angina pectoris: Secondary | ICD-10-CM | POA: Diagnosis not present

## 2017-03-23 LAB — CBC
HEMATOCRIT: 42.4 % (ref 39.0–52.0)
Hemoglobin: 14.2 g/dL (ref 13.0–17.0)
MCH: 30.3 pg (ref 26.0–34.0)
MCHC: 33.5 g/dL (ref 30.0–36.0)
MCV: 90.6 fL (ref 78.0–100.0)
Platelets: 256 10*3/uL (ref 150–400)
RBC: 4.68 MIL/uL (ref 4.22–5.81)
RDW: 13.5 % (ref 11.5–15.5)
WBC: 6.6 10*3/uL (ref 4.0–10.5)

## 2017-03-23 LAB — ECHOCARDIOGRAM COMPLETE
AVLVOTPG: 3 mmHg
CHL CUP MV DEC (S): 264
CHL CUP STROKE VOLUME: 72 mL
E/e' ratio: 7.01
EWDT: 264 ms
FS: 39 % (ref 28–44)
IV/PV OW: 1.19
LA ID, A-P, ES: 33 mm
LA vol A4C: 66.2 ml
LA vol index: 29.2 mL/m2
LADIAMINDEX: 1.35 cm/m2
LAVOL: 71.4 mL
LEFT ATRIUM END SYS DIAM: 33 mm
LV E/e' medial: 7.01
LV TDI E'LATERAL: 8.81
LV TDI E'MEDIAL: 7.18
LV dias vol index: 45 mL/m2
LV e' LATERAL: 8.81 cm/s
LV sys vol: 38 mL (ref 21–61)
LVDIAVOL: 109 mL (ref 62–150)
LVEEAVG: 7.01
LVOT SV: 108 mL
LVOT VTI: 23.8 cm
LVOT area: 4.52 cm2
LVOT diameter: 24 mm
LVOTPV: 83.9 cm/s
LVSYSVOLIN: 15 mL/m2
MVPKAVEL: 60 m/s
MVPKEVEL: 61.8 m/s
PW: 9.9 mm — AB (ref 0.6–1.1)
Simpson's disk: 66

## 2017-03-23 NOTE — Progress Notes (Signed)
*  PRELIMINARY RESULTS* Echocardiogram 2D Echocardiogram has been performed.  Omar Ruiz 03/23/2017, 12:12 PM

## 2017-07-23 DIAGNOSIS — G4733 Obstructive sleep apnea (adult) (pediatric): Secondary | ICD-10-CM | POA: Diagnosis not present

## 2017-08-01 DIAGNOSIS — G4733 Obstructive sleep apnea (adult) (pediatric): Secondary | ICD-10-CM | POA: Diagnosis not present

## 2017-08-31 DIAGNOSIS — I10 Essential (primary) hypertension: Secondary | ICD-10-CM | POA: Diagnosis not present

## 2017-08-31 DIAGNOSIS — E785 Hyperlipidemia, unspecified: Secondary | ICD-10-CM | POA: Diagnosis not present

## 2017-08-31 DIAGNOSIS — Z125 Encounter for screening for malignant neoplasm of prostate: Secondary | ICD-10-CM | POA: Diagnosis not present

## 2017-09-01 DIAGNOSIS — G4733 Obstructive sleep apnea (adult) (pediatric): Secondary | ICD-10-CM | POA: Diagnosis not present

## 2017-09-02 DIAGNOSIS — I10 Essential (primary) hypertension: Secondary | ICD-10-CM | POA: Diagnosis not present

## 2017-09-02 DIAGNOSIS — E782 Mixed hyperlipidemia: Secondary | ICD-10-CM | POA: Diagnosis not present

## 2017-09-02 DIAGNOSIS — R000 Tachycardia, unspecified: Secondary | ICD-10-CM | POA: Diagnosis not present

## 2017-09-02 DIAGNOSIS — M19049 Primary osteoarthritis, unspecified hand: Secondary | ICD-10-CM | POA: Diagnosis not present

## 2017-09-02 DIAGNOSIS — Z0001 Encounter for general adult medical examination with abnormal findings: Secondary | ICD-10-CM | POA: Diagnosis not present

## 2017-09-17 ENCOUNTER — Encounter: Payer: Self-pay | Admitting: Cardiovascular Disease

## 2017-09-17 ENCOUNTER — Ambulatory Visit (INDEPENDENT_AMBULATORY_CARE_PROVIDER_SITE_OTHER): Payer: Medicare Other | Admitting: Cardiovascular Disease

## 2017-09-17 VITALS — BP 122/78 | HR 65 | Ht 73.0 in | Wt 237.0 lb

## 2017-09-17 DIAGNOSIS — E785 Hyperlipidemia, unspecified: Secondary | ICD-10-CM | POA: Diagnosis not present

## 2017-09-17 DIAGNOSIS — Z720 Tobacco use: Secondary | ICD-10-CM | POA: Diagnosis not present

## 2017-09-17 DIAGNOSIS — I10 Essential (primary) hypertension: Secondary | ICD-10-CM

## 2017-09-17 DIAGNOSIS — I25118 Atherosclerotic heart disease of native coronary artery with other forms of angina pectoris: Secondary | ICD-10-CM | POA: Diagnosis not present

## 2017-09-17 DIAGNOSIS — Z955 Presence of coronary angioplasty implant and graft: Secondary | ICD-10-CM

## 2017-09-17 MED ORDER — NICOTINE 21 MG/24HR TD PT24: 21.0000 mg | MEDICATED_PATCH | Freq: Every day | TRANSDERMAL | 6 refills | Status: DC

## 2017-09-17 NOTE — Progress Notes (Signed)
SUBJECTIVE: The patient presents for routine follow-up for coronary artery disease.  He underwent a low low risk nuclear stress test on 02/23/17.  He had a low risk Duke treadmill score of 7.  There was no evidence of myocardial ischemia or scar.  Coronary angiography on 09/04/2009 demonstrated a proximal 20% LAD stenosis and a proximal left circumflex 20% stenosis. The RCA was normal. He underwent PCI at that time for a mid LAD 90% stenosis. He also has a history of hypertension and hyperlipidemia. He has sleep apnea and uses CPAP.  Pulmonary function testing in November 2018 demonstrated no evidence of COPD but possibly some mild pulmonary hypertension. I checked a CBC which was normal.  I obtained an echocardiogram on 03/23/2017 which demonstrated normal left ventricular systolic function and regional wall motion, LVEF 60 to 65%, mild focal basal septal hypertrophy, and grade 1 diastolic dysfunction.  There was no evidence of pulmonary hypertension.  He changed jobs and now works for a company in [place], [place] and his stress levels have subsided considerably.  The patient denies any symptoms of chest pain, palpitations, shortness of breath, lightheadedness, dizziness, leg swelling, orthopnea, PND, and syncope.  He wants to quit smoking and could not afford Chantix as it was would cost him $300 per month.  I reviewed labs performed on 08/31/2017: Total cholesterol 155, triglycerides 110, HDL 49, LDL 84, BUN 16, creatinine 0.86.  I personally reviewed an ECG performed on 09/02/2017 which showed normal sinus rhythm with nonspecific T wave abnormalities in leads III and aVF.   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  Review of Systems: As per "subjective", otherwise negative.  No Known Allergies  Current Outpatient Medications  Medication Sig Dispense Refill  . aspirin 81 MG tablet Take 81 mg by mouth daily.     . Cholecalciferol  (VITAMIN D-3) 1000 UNITS CAPS Take by mouth daily.    . fish oil-omega-3 fatty acids 1000 MG capsule Take 2 g by mouth daily.    . Glucosamine-Chondroit-Vit C-Mn (GLUCOSAMINE CHONDR 1500 COMPLX PO) Take by mouth.    . meloxicam (MOBIC) 15 MG tablet Take 15 mg by mouth daily.    . metoprolol succinate (TOPROL-XL) 25 MG 24 hr tablet Take 1 tablet (25 mg total) by mouth daily. 90 tablet 3  . nitroGLYCERIN (NITROSTAT) 0.4 MG SL tablet Place 1 tablet (0.4 mg total) under the tongue every 5 (five) minutes as needed for chest pain. 25 tablet 3  . simvastatin (ZOCOR) 20 MG tablet TAKE ONE TABLET BY MOUTH AT BEDTIME 90 tablet 3   No current facility-administered medications for this visit.     Past Medical History:  Diagnosis Date  . Asthma   . CAD (coronary artery disease)    Stent in 09/05/2009  . Chest pain 07/2004   2D Echo EF>55%  . COPD (chronic obstructive pulmonary disease) ([place])   . COPD, mild ([place])   . Hyperlipemia   . Hypertension 10/2004   stress test EF 57%  . Obesity   . Palpitation     Past Surgical History:  Procedure Laterality Date  . COLONOSCOPY  10/2004  . COLONOSCOPY N/A 03/29/2013   Procedure: COLONOSCOPY;  Surgeon: [name], MD;  [facility];  Service: Gastroenterology;  Laterality: N/A;  . STENTS  09/05/2009   3.0x80mm promus drug eluting stent for a 90% mid LAD artery stenosis    Social History  Socioeconomic History  . Marital status: Married    Spouse name: Not on file  . Number of children: Not on file  . Years of education: 1  . Highest education level: Not on file  Occupational History  . Not on file  Social Needs  . Financial resource strain: Not on file  . Food insecurity:    Worry: Not on file    Inability: Not on file  . Transportation needs:    Medical: Not on file    Non-medical: Not on file  Tobacco Use  . Smoking status: Former Smoker    Last attempt to quit: 05/12/1981    Years since quitting: 36.3  . Smokeless  tobacco: Never Used  Substance and Sexual Activity  . Alcohol use: Yes    Alcohol/week: 0.0 oz  . Drug use: No  . Sexual activity: Yes  Lifestyle  . Physical activity:    Days per week: Not on file    Minutes per session: Not on file  . Stress: Not on file  Relationships  . Social connections:    Talks on phone: Not on file    Gets together: Not on file    Attends religious service: Not on file    Active member of club or organization: Not on file    Attends meetings of clubs or organizations: Not on file    Relationship status: Not on file  . Intimate partner violence:    Fear of current or ex partner: Not on file    Emotionally abused: Not on file    Physically abused: Not on file    Forced sexual activity: Not on file  Other Topics Concern  . Not on file  Social History Narrative  . Not on file     Vitals:   09/17/17 1528  BP: 122/78  Pulse: 65  SpO2: 97%  Weight: 237 lb (107.5 kg)  Height: 6\' 1"  (1.854 m)    Wt Readings from Last 3 Encounters:  09/17/17 237 lb (107.5 kg)  03/13/17 250 lb (113.4 kg)  02/16/17 252 lb (114.3 kg)     PHYSICAL EXAM General: NAD HEENT: Normal. Neck: No JVD, no thyromegaly. Lungs: Clear to auscultation bilaterally with normal respiratory effort. CV: Regular rate and rhythm, normal S1/S2, no S3/S4, no murmur. No pretibial or periankle edema.  No carotid bruit.   Abdomen: Soft, nontender, no distention.  Neurologic: Alert and oriented.  Psych: Normal affect. Skin: Normal. Musculoskeletal: No gross deformities.    ECG: Most recent ECG reviewed.   Labs: Lab Results  Component Value Date/Time   K 3.6 09/05/2009 06:33 AM   BUN 7 09/05/2009 06:33 AM   CREATININE 0.92 09/05/2009 06:33 AM   HGB 14.2 03/23/2017 12:18 PM     Lipids: Lab Results  Component Value Date/Time   LDLCALC 72 02/23/2017 08:05 AM   CHOL 135 02/23/2017 08:05 AM   TRIG 97 02/23/2017 08:05 AM   HDL 44 02/23/2017 08:05 AM       ASSESSMENT AND  PLAN:  1. CAD with LAD stent: Symptomatically stable. Nuclear stress test was normal as noted above.  Low risk Duke treadmill score portends a low risk of major adverse cardiac events.  Continue present therapy with aspirin, metoprolol succinate, and simvastatin.  2. Essential HTN: Controlled on present therapy.  No changes.  3. Hyperlipidemia:  Lipids reviewed above. Continue simvastatin 20 mg.  4.  Tobacco use: He would like to quit.  He could not afford Chantix.  I will prescribe nicotine patches.    Disposition: Follow up 1 year   Kate Sable, M.D., F.A.C.C.

## 2017-09-17 NOTE — Patient Instructions (Addendum)
Your physician wants you to follow-up in:1 year  months with Dr.Koneswaran You will receive a reminder letter in the mail two months in advance. If you don't receive a letter, please call our office to schedule the follow-up appointment.    Take Nicotine patches as directed   All other medications stay the same.    No lab work or tests today.     Thank you for choosing [facility] !

## 2017-10-10 ENCOUNTER — Other Ambulatory Visit: Payer: Self-pay | Admitting: Cardiovascular Disease

## 2017-10-13 DIAGNOSIS — M546 Pain in thoracic spine: Secondary | ICD-10-CM | POA: Diagnosis not present

## 2017-10-13 DIAGNOSIS — M9902 Segmental and somatic dysfunction of thoracic region: Secondary | ICD-10-CM | POA: Diagnosis not present

## 2017-10-13 DIAGNOSIS — M9907 Segmental and somatic dysfunction of upper extremity: Secondary | ICD-10-CM | POA: Diagnosis not present

## 2017-10-13 DIAGNOSIS — M25511 Pain in right shoulder: Secondary | ICD-10-CM | POA: Diagnosis not present

## 2017-10-16 DIAGNOSIS — M546 Pain in thoracic spine: Secondary | ICD-10-CM | POA: Diagnosis not present

## 2017-10-16 DIAGNOSIS — M25511 Pain in right shoulder: Secondary | ICD-10-CM | POA: Diagnosis not present

## 2017-10-16 DIAGNOSIS — M9902 Segmental and somatic dysfunction of thoracic region: Secondary | ICD-10-CM | POA: Diagnosis not present

## 2017-10-16 DIAGNOSIS — M9907 Segmental and somatic dysfunction of upper extremity: Secondary | ICD-10-CM | POA: Diagnosis not present

## 2017-10-20 DIAGNOSIS — M9907 Segmental and somatic dysfunction of upper extremity: Secondary | ICD-10-CM | POA: Diagnosis not present

## 2017-10-20 DIAGNOSIS — M9902 Segmental and somatic dysfunction of thoracic region: Secondary | ICD-10-CM | POA: Diagnosis not present

## 2017-10-20 DIAGNOSIS — M25511 Pain in right shoulder: Secondary | ICD-10-CM | POA: Diagnosis not present

## 2017-10-20 DIAGNOSIS — M546 Pain in thoracic spine: Secondary | ICD-10-CM | POA: Diagnosis not present

## 2017-11-02 ENCOUNTER — Ambulatory Visit (INDEPENDENT_AMBULATORY_CARE_PROVIDER_SITE_OTHER): Payer: Medicare Other | Admitting: Orthopedic Surgery

## 2017-11-02 ENCOUNTER — Telehealth: Payer: Self-pay | Admitting: Radiology

## 2017-11-02 ENCOUNTER — Ambulatory Visit (INDEPENDENT_AMBULATORY_CARE_PROVIDER_SITE_OTHER): Payer: Medicare Other

## 2017-11-02 VITALS — BP 136/85 | HR 70 | Ht 73.0 in | Wt 239.0 lb

## 2017-11-02 DIAGNOSIS — M25511 Pain in right shoulder: Secondary | ICD-10-CM

## 2017-11-02 NOTE — Progress Notes (Signed)
Progress Note   Patient ID: Omar Ruiz, male   DOB: 30-Jan-1951, 67 y.o.   MRN: [id number] Chief Complaint  Patient presents with  . Shoulder Pain    Right shoulder pain, referred by Dr. Lovena Le.     Chief Complaint  Patient presents with  . Shoulder Pain    Right shoulder pain, referred by Dr. Lovena Le.    67 year old male with osteoarthritis right shoulder comes in with increasing pain.  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  He had trigger point injection for periscapular pain which was probably related to his rotator cuff and glenohumeral joint dysfunction that did well until recently.  About 7 weeks ago started having shoulder joint pain radiating to his elbow and up into his neck this was associated with decreased range of motion without weakness in the right shoulder.  He went to a chiropractor he took ibuprofen Tylenol stopped his meloxicam and used heat because the ice was making it worse  Dull aching pain right shoulder 7 weeks time loss of motion    Review of Systems  Musculoskeletal: Positive for neck pain.  Neurological: Positive for tingling. Negative for sensory change, focal weakness and weakness.   No outpatient medications have been marked as taking for the 11/02/17 encounter (Office Visit) with Carole Civil, MD.    Past Medical History:  Diagnosis Date  . Asthma   . CAD (coronary artery disease)    Stent in 09/05/2009  . Chest pain 07/2004   2D Echo EF>55%  . COPD (chronic obstructive pulmonary disease) ([place])   . COPD, mild ([place])   . Hyperlipemia   . Hypertension 10/2004   stress test EF 57%  . Obesity   . Palpitation      No Known Allergies   BP 136/85   Pulse 70   Ht 6\' 1"  (1.854 m)   Wt 239 lb (108.4 kg)   BMI 31.53 kg/m    Physical Exam  Constitutional: He is oriented to person, place, and time. He appears well-developed and well-nourished.  Vital signs have been reviewed and are stable.  Gen. appearance the patient is well-developed and well-nourished with normal grooming and hygiene.   Neurological: He is alert and oriented to person, place, and time.  Skin: Skin is warm and dry. No erythema.  Psychiatric: He has a normal mood and affect.  Vitals reviewed.   Ortho Exam   Left shoulder alignment normal range of motion normal stability normal strength normal skin normal pulse normal lymph nodes normal sensation normal  Right shoulder sensation normal pulse normal skin normal strength normal stability normal  Decreased external rotation active abduction 90 active flexion 90 painful flexion throughout the arc of 90 to 115 degrees stop there because of pain tenderness anterior shoulder joint line  Arther Abbott, MD   MEDICAL DECISION MAKING   Imaging:  New x-ray shows glenohumeral arthritis right shoulder inferior osteophyte characteristic position  NEW PROBLEM  Encounter Diagnosis  Name Primary?  . Pain in joint of right shoulder Yes     PLAN: (RX., injection, surgery,frx,mri/ct, XR 2 body ares) Recommend shoulder replacement  However he wants to try an intra-articular injection because he does not want to stop working told him he probably would not be able to load and unload trucks with a shoulder replacement  He will go for injection  No orders of the defined types were placed in this encounter.  5:14 PM 11/02/2017

## 2017-11-02 NOTE — Telephone Encounter (Signed)
Called to schedule injection for right shoulder at [facility], advised him while he was here 11/09/17 at [place]

## 2017-11-03 ENCOUNTER — Telehealth: Payer: Self-pay | Admitting: Orthopedic Surgery

## 2017-11-03 DIAGNOSIS — M25511 Pain in right shoulder: Secondary | ICD-10-CM

## 2017-11-03 NOTE — Telephone Encounter (Signed)
Okay he said he wanted to try injection first so Dr. Tamera Punt is okay and as far as pain medication goes we can try some Relafen 750 mg twice a day #30

## 2017-11-03 NOTE — Telephone Encounter (Signed)
Patient/wife, Omar Ruiz, designated contact on file, called with questions regarding office visit of 11/02/17. Asking for specific diagnosis of patient's shoulder; also asking about medication, if any, may help?  Ph# [phone].

## 2017-11-03 NOTE — Telephone Encounter (Signed)
Patient wants to know if you will refer to Dr Carlis Abbott had me set him up for injection of the shoulder but no mention about referral.

## 2017-11-03 NOTE — Telephone Encounter (Signed)
Linus Orn, called back asking if there is any type of pain medication the patient can take until he is seen by Dr. Tamera Punt. She is still wanting to know the specific diagnosis of the shoulder. She has spoken with Dr. Bettina Gavia office and was told as soon as they get the notes from our office they would be able to schedule the patient an appointment.   She has given me the fax number [phone] and put it to the attention of Heidi.  Please call Linus Orn and advise her.

## 2017-11-04 MED ORDER — NABUMETONE 750 MG PO TABS: 750.0000 mg | ORAL_TABLET | Freq: Two times a day (BID) | ORAL | 0 refills | Status: DC

## 2017-11-04 NOTE — Telephone Encounter (Signed)
Called his wife to advise No ibuprofen with the Relafen /tylenol is okay

## 2017-11-04 NOTE — Telephone Encounter (Signed)
Sent in [place] and put in referral

## 2017-11-09 ENCOUNTER — Ambulatory Visit (HOSPITAL_COMMUNITY): Payer: Medicare Other

## 2017-11-18 DIAGNOSIS — M19011 Primary osteoarthritis, right shoulder: Secondary | ICD-10-CM | POA: Diagnosis not present

## 2017-12-03 ENCOUNTER — Other Ambulatory Visit: Payer: Self-pay | Admitting: Orthopedic Surgery

## 2017-12-04 ENCOUNTER — Other Ambulatory Visit: Payer: Self-pay | Admitting: Orthopedic Surgery

## 2017-12-04 DIAGNOSIS — M19011 Primary osteoarthritis, right shoulder: Secondary | ICD-10-CM

## 2017-12-14 ENCOUNTER — Ambulatory Visit
Admission: RE | Admit: 2017-12-14 | Discharge: 2017-12-14 | Disposition: A | Discharge status: Home / Self Care | Payer: Medicare Other | Source: Ambulatory Visit | Attending: Orthopedic Surgery | Admitting: Orthopedic Surgery

## 2017-12-14 DIAGNOSIS — M19011 Primary osteoarthritis, right shoulder: Secondary | ICD-10-CM

## 2017-12-14 DIAGNOSIS — M25611 Stiffness of right shoulder, not elsewhere classified: Secondary | ICD-10-CM | POA: Diagnosis not present

## 2017-12-15 NOTE — Pre-Procedure Instructions (Addendum)
Omar Ruiz  12/15/2017      [facility], [place] [address] [id number] [place] [address] Phone: [phone] Fax: [phone]    Your procedure is scheduled on December 24, 2017.  Report to [facility] Admitting at 530 AM.  Call this number if you have problems the morning of surgery:  [phone]   Remember:  Do not eat or drink after midnight.    Take these medicines the morning of surgery with A SIP OF WATER  Metoprolol succinate (TOPROL XL) Tylenol-if needed Nitrostat-if needed for chest pain  Follow your surgeon's instructions on when to hold/resume aspirin.  If no instructions were given call the office to determine how they would like to you take aspirin  7 days prior to surgery STOP taking any Aspirin (unless otherwise instructed by your surgeon), Aleve, Naproxen, Ibuprofen, Motrin, Advil, Goody's, BC's, all herbal medications, fish oil, and all vitamins    Do not wear jewelry  Do not wear lotions, powders, or colognes, or deodorant.  Men may shave face and neck.  Do not bring valuables to the hospital.  [facility] is not responsible for any belongings or valuables.  Contacts, dentures or bridgework may not be worn into surgery.  Leave your suitcase in the car.  After surgery it may be brought to your room.  For patients admitted to the hospital, discharge time will be determined by your treatment team.  Patients discharged the day of surgery will not be allowed to drive home.    Omar Ruiz- Preparing For Surgery  Before surgery, you can play an important role. Because skin is not sterile, your skin needs to be as free of germs as possible. You can reduce the number of germs on your skin by washing with CHG (chlorahexidine gluconate) Soap before surgery.  CHG is an antiseptic cleaner which kills germs and bonds with the skin to continue killing germs even after washing.    Oral Hygiene is also important  to reduce your risk of infection.  Remember - BRUSH YOUR TEETH THE MORNING OF SURGERY WITH YOUR REGULAR TOOTHPASTE  Please do not use if you have an allergy to CHG or antibacterial soaps. If your skin becomes reddened/irritated stop using the CHG.  Do not shave (including legs and underarms) for at least 48 hours prior to first CHG shower. It is OK to shave your face.  Please follow these instructions carefully.   1. Shower the NIGHT BEFORE SURGERY and the MORNING OF SURGERY with CHG.   2. If you chose to wash your hair, wash your hair first as usual with your normal shampoo.  3. After you shampoo, rinse your hair and body thoroughly to remove the shampoo.  4. Use CHG as you would any other liquid soap. You can apply CHG directly to the skin and wash gently with a scrungie or a clean washcloth.   5. Apply the CHG Soap to your body ONLY FROM THE NECK DOWN.  Do not use on open wounds or open sores. Avoid contact with your eyes, ears, mouth and genitals (private parts). Wash Face and genitals (private parts)  with your normal soap.  6. Wash thoroughly, paying special attention to the area where your surgery will be performed.  7. Thoroughly rinse your body with warm water from the neck down.  8. DO NOT shower/wash with your normal soap after using and rinsing off the CHG Soap.  9.  Pat yourself dry with a CLEAN TOWEL.  10. Wear CLEAN PAJAMAS to bed the night before surgery, wear comfortable clothes the morning of surgery  11. Place CLEAN SHEETS on your bed the night of your first shower and DO NOT SLEEP WITH PETS.  Day of Surgery:  Do not apply any deodorants/lotions.  Please wear clean clothes to the hospital/surgery center.   Remember to brush your teeth WITH YOUR REGULAR TOOTHPASTE.   Please read over the following fact sheets that you were given. Pain Booklet, Coughing and Deep Breathing, MRSA Information and Surgical Site Infection Prevention

## 2017-12-16 ENCOUNTER — Encounter (HOSPITAL_COMMUNITY)
Admission: RE | Admit: 2017-12-16 | Discharge: 2017-12-16 | Disposition: A | Discharge status: Home / Self Care | Payer: Medicare Other | Source: Ambulatory Visit | Attending: Orthopedic Surgery | Admitting: Orthopedic Surgery

## 2017-12-16 ENCOUNTER — Other Ambulatory Visit: Payer: Self-pay

## 2017-12-16 ENCOUNTER — Encounter (HOSPITAL_COMMUNITY): Payer: Self-pay

## 2017-12-16 ENCOUNTER — Ambulatory Visit (HOSPITAL_COMMUNITY)
Admission: RE | Admit: 2017-12-16 | Discharge: 2017-12-16 | Disposition: A | Discharge status: Home / Self Care | Payer: Medicare Other | Source: Ambulatory Visit | Attending: Orthopedic Surgery | Admitting: Orthopedic Surgery

## 2017-12-16 DIAGNOSIS — Z6832 Body mass index (BMI) 32.0-32.9, adult: Secondary | ICD-10-CM | POA: Insufficient documentation

## 2017-12-16 DIAGNOSIS — R0789 Other chest pain: Secondary | ICD-10-CM | POA: Diagnosis not present

## 2017-12-16 DIAGNOSIS — J449 Chronic obstructive pulmonary disease, unspecified: Secondary | ICD-10-CM | POA: Insufficient documentation

## 2017-12-16 DIAGNOSIS — G4733 Obstructive sleep apnea (adult) (pediatric): Secondary | ICD-10-CM | POA: Insufficient documentation

## 2017-12-16 DIAGNOSIS — E669 Obesity, unspecified: Secondary | ICD-10-CM | POA: Diagnosis not present

## 2017-12-16 DIAGNOSIS — I2510 Atherosclerotic heart disease of native coronary artery without angina pectoris: Secondary | ICD-10-CM | POA: Insufficient documentation

## 2017-12-16 DIAGNOSIS — Z79899 Other long term (current) drug therapy: Secondary | ICD-10-CM | POA: Insufficient documentation

## 2017-12-16 DIAGNOSIS — M19011 Primary osteoarthritis, right shoulder: Secondary | ICD-10-CM | POA: Insufficient documentation

## 2017-12-16 DIAGNOSIS — Z01812 Encounter for preprocedural laboratory examination: Secondary | ICD-10-CM | POA: Diagnosis not present

## 2017-12-16 DIAGNOSIS — Z01818 Encounter for other preprocedural examination: Secondary | ICD-10-CM

## 2017-12-16 DIAGNOSIS — I10 Essential (primary) hypertension: Secondary | ICD-10-CM | POA: Insufficient documentation

## 2017-12-16 DIAGNOSIS — Z7982 Long term (current) use of aspirin: Secondary | ICD-10-CM | POA: Insufficient documentation

## 2017-12-16 HISTORY — DX: Malignant (primary) neoplasm, unspecified: C80.1

## 2017-12-16 HISTORY — DX: Sleep apnea, unspecified: G47.30

## 2017-12-16 HISTORY — DX: Unspecified osteoarthritis, unspecified site: M19.90

## 2017-12-16 LAB — CBC WITH DIFFERENTIAL/PLATELET
Abs Immature Granulocytes: 0 10*3/uL (ref 0.0–0.1)
Basophils Absolute: 0.1 10*3/uL (ref 0.0–0.1)
Basophils Relative: 1 %
EOS PCT: 5 %
Eosinophils Absolute: 0.3 10*3/uL (ref 0.0–0.7)
HEMATOCRIT: 43.1 % (ref 39.0–52.0)
HEMOGLOBIN: 14.4 g/dL (ref 13.0–17.0)
IMMATURE GRANULOCYTES: 0 %
LYMPHS ABS: 2.4 10*3/uL (ref 0.7–4.0)
LYMPHS PCT: 39 %
MCH: 30.8 pg (ref 26.0–34.0)
MCHC: 33.4 g/dL (ref 30.0–36.0)
MCV: 92.1 fL (ref 78.0–100.0)
MONOS PCT: 13 %
Monocytes Absolute: 0.8 10*3/uL (ref 0.1–1.0)
Neutro Abs: 2.6 10*3/uL (ref 1.7–7.7)
Neutrophils Relative %: 42 %
Platelets: 226 10*3/uL (ref 150–400)
RBC: 4.68 MIL/uL (ref 4.22–5.81)
RDW: 13.9 % (ref 11.5–15.5)
WBC: 6.1 10*3/uL (ref 4.0–10.5)

## 2017-12-16 LAB — PROTIME-INR
INR: 0.96
Prothrombin Time: 12.7 seconds (ref 11.4–15.2)

## 2017-12-16 LAB — URINALYSIS, ROUTINE W REFLEX MICROSCOPIC
Bilirubin Urine: NEGATIVE
Glucose, UA: NEGATIVE mg/dL
Hgb urine dipstick: NEGATIVE
Ketones, ur: NEGATIVE mg/dL
LEUKOCYTES UA: NEGATIVE
NITRITE: NEGATIVE
Protein, ur: NEGATIVE mg/dL
SPECIFIC GRAVITY, URINE: 1.006 (ref 1.005–1.030)
pH: 7 (ref 5.0–8.0)

## 2017-12-16 LAB — COMPREHENSIVE METABOLIC PANEL
ALK PHOS: 65 U/L (ref 38–126)
ALT: 26 U/L (ref 0–44)
AST: 25 U/L (ref 15–41)
Albumin: 4 g/dL (ref 3.5–5.0)
Anion gap: 9 (ref 5–15)
BILIRUBIN TOTAL: 1.1 mg/dL (ref 0.3–1.2)
BUN: 10 mg/dL (ref 8–23)
CALCIUM: 9.2 mg/dL (ref 8.9–10.3)
CO2: 28 mmol/L (ref 22–32)
Chloride: 105 mmol/L (ref 98–111)
Creatinine, Ser: 1.07 mg/dL (ref 0.61–1.24)
GFR calc Af Amer: >60 mL/min (ref >60)
GFR calc non Af Amer: >60 mL/min (ref >60)
Glucose, Bld: 98 mg/dL (ref 70–99)
Potassium: 4.2 mmol/L (ref 3.5–5.1)
Sodium: 142 mmol/L (ref 135–145)
TOTAL PROTEIN: 6.8 g/dL (ref 6.5–8.1)

## 2017-12-16 LAB — SURGICAL PCR SCREEN
MRSA, PCR: POSITIVE — AB
STAPHYLOCOCCUS AUREUS: POSITIVE — AB

## 2017-12-16 LAB — TYPE AND SCREEN
ABO/RH(D): B NEG
Antibody Screen: NEGATIVE

## 2017-12-16 LAB — ABO/RH: ABO/RH(D): B NEG

## 2017-12-16 LAB — APTT: aPTT: 27 seconds (ref 24–36)

## 2017-12-16 NOTE — Progress Notes (Signed)
Mupirocin Ointment Rx called into [facility] in [place] for positive PCR of MRSA and Staph. Pt notified of results and need to pick up Rx. Pt voiced understanding

## 2017-12-16 NOTE — Progress Notes (Signed)
PCP - [facility] Cardiologist - Dr Bronson Ing  Chest x-ray - 12/16/2017  EKG - 09/02/17 Stress Test - 11/01/2004 ECHO - 03/23/17 Cardiac Cath - 09/05/09- notes tab  Sleep Study - requested from [facility] CPAP - pressure settings 4.5  Aspirin Instructions: Hold ASA 5 days prior to sx  Anesthesia review: cardiac hx  Patient denies shortness of breath, fever, cough and chest pain at PAT appointment. Pt reports he is not acutely sick.   Patient verbalized understanding of instructions that were given to them at the PAT appointment. Patient was also instructed that they will need to review over the PAT instructions again at home before surgery.

## 2017-12-17 ENCOUNTER — Other Ambulatory Visit: Payer: Self-pay | Admitting: Orthopedic Surgery

## 2017-12-17 NOTE — Progress Notes (Signed)
Anesthesia Chart Review:   Case:  [id number] Date/Time:  12/24/17 0715   Procedure:  RIGHT TOTAL SHOULDER ARTHROPLASTY (Right Shoulder)   Anesthesia type:  Choice   Pre-op diagnosis:  RIGHT SHOULDER OSTEOARTHRITIS   Location:  [facility] OR ROOM 07 / [facility] OR   Surgeon:  Tania Ade, MD      DISCUSSION: - Pt is a 67 year old male with hx CAD (s/p DES to LAD 2011), HTN, OSA, COPD  - Has cardiac clearance for surgery.    VS: BP (!) 152/90   Pulse 61   Temp 36.6 C   Resp 20   Ht 6\' 1"  (1.854 m)   Wt 113 kg   SpO2 100%   BMI 32.86 kg/m    PROVIDERS: - PCP is Rowan Blase, PA at [facility] is Kate Sable, MD who cleared pt for surgery. Last office visit 09/17/17   LABS: Labs reviewed: Acceptable for surgery. (all labs ordered are listed, but only abnormal results are displayed)  Labs Reviewed  SURGICAL PCR SCREEN - Abnormal; Notable for the following components:      Result Value   MRSA, PCR POSITIVE (*)    Staphylococcus aureus POSITIVE (*)    All other components within normal limits  URINALYSIS, ROUTINE W REFLEX MICROSCOPIC - Abnormal; Notable for the following components:   Color, Urine STRAW (*)    All other components within normal limits  APTT  CBC WITH DIFFERENTIAL/PLATELET  COMPREHENSIVE METABOLIC PANEL  PROTIME-INR  TYPE AND SCREEN     IMAGES:  CXR 12/16/17: No active cardiopulmonary disease.   EKG 09/02/17: Sinus rhythm.  Nonspecific inferior T wave changes.   CV:  Echo 03/23/17:  - Left ventricle: The cavity size was normal. Systolic function was normal. The estimated ejection fraction was in the range of 60% to 65%. Wall motion was normal; there were no regional wall motion abnormalities. Doppler parameters are consistent with abnormal left ventricular relaxation (grade 1 diastolic dysfunction). Mild focal basal septal hypertrophy.  Nuclear stress test 02/23/17:  No diagnostic ST segment changes to indicate ischemia. No arrhythmias  noted. Low risk Duke treadmill score of 7.  No significant myocardial perfusion defects to indicate scar or ischemia.  This is a low risk study.  Nuclear stress EF: 63%.  Carotid duplex 10/15/09:  - Minimal plaque formation and intimal thickening in carotid systems. - No evidence of hemodynamically significant stenosis  Cardiac cath 09/04/2009: 1.  LM: No significant disease 2.  LAD: Proximal 20%; mid 90%.  S/p DES to LAD 09/05/09. 3.  CX: Proximal 20%.  Gives off 2 OM arteries. 4.  RCA: No significant disease   Past Medical History:  Diagnosis Date  . Arthritis   . Asthma   . CAD (coronary artery disease)    Stent in 09/05/2009  . Cancer ([place])    skin cancer on hand, cancer removed  . Chest pain 07/2004   2D Echo EF>55%  . COPD (chronic obstructive pulmonary disease) ([place])   . COPD, mild ([place])    pt states he had study done and he does not have this anymore  . Hyperlipemia   . Hypertension 10/2004   stress test EF 57%  . Obesity   . Palpitation   . Sleep apnea    CPAP 4.5 pressure setting    Past Surgical History:  Procedure Laterality Date  . COLONOSCOPY  10/2004  . COLONOSCOPY N/A 03/29/2013   Procedure: COLONOSCOPY;  Surgeon: [name], MD;  Location: [facility];  Service: Gastroenterology;  Laterality: N/A;  . NASAL SINUS SURGERY    . SKIN CANCER EXCISION     right hand   . STENTS  09/05/2009   3.0x87mm promus drug eluting stent for a 90% mid LAD artery stenosis    MEDICATIONS: . acetaminophen (TYLENOL) 500 MG tablet  . aspirin 81 MG tablet  . metoprolol succinate (TOPROL-XL) 25 MG 24 hr tablet  . nabumetone (RELAFEN) 750 MG tablet  . naproxen (NAPROSYN) 500 MG tablet  . nicotine (NICODERM CQ) 21 mg/24hr patch  . nitroGLYCERIN (NITROSTAT) 0.4 MG SL tablet  . OVER THE COUNTER MEDICATION  . simvastatin (ZOCOR) 20 MG tablet  . Turmeric Curcumin 500 MG CAPS   No current facility-administered medications for this encounter.     If no changes, I  anticipate pt can proceed with surgery as scheduled.   Willeen Cass, FNP-BC [facility]/Anesthesiology Phone: [phone] 12/17/2017 2:23 PM

## 2017-12-23 MED ORDER — SODIUM CHLORIDE 0.9 % IV SOLN
1500.0000 mg | INTRAVENOUS | Status: AC
  Administered 2017-12-24: 1500 mg via INTRAVENOUS
  Filled 2017-12-23: qty 1500

## 2017-12-23 MED ORDER — CEFAZOLIN SODIUM-DEXTROSE 2-4 GM/100ML-% IV SOLN
2.0000 g | INTRAVENOUS | Status: AC
  Administered 2017-12-24: 2 g via INTRAVENOUS
  Filled 2017-12-23: qty 100

## 2017-12-23 MED ORDER — VANCOMYCIN HCL 10 G IV SOLR
1500.0000 mg | INTRAVENOUS | Status: DC
  Filled 2017-12-23: qty 1500

## 2017-12-23 MED ORDER — TRANEXAMIC ACID 1000 MG/10ML IV SOLN
1000.0000 mg | INTRAVENOUS | Status: AC
  Administered 2017-12-24: 1000 mg via INTRAVENOUS
  Filled 2017-12-23: qty 1100

## 2017-12-23 NOTE — Anesthesia Preprocedure Evaluation (Addendum)
Anesthesia Evaluation  Patient identified by MRN, date of birth, ID band Patient awake    Reviewed: Allergy & Precautions, NPO status , Patient's Chart, lab work & pertinent test results, reviewed documented beta blocker date and time   Airway Mallampati: III  TM Distance: >3 FB Neck ROM: Full    Dental  (+) Partial Upper   Pulmonary asthma , sleep apnea and Continuous Positive Airway Pressure Ventilation , former smoker,    Pulmonary exam normal breath sounds clear to auscultation       Cardiovascular hypertension, Pt. on home beta blockers + CAD and + Cardiac Stents (x 1)  Normal cardiovascular exam Rhythm:Regular Rate:Normal  ECG: SR, rate 63  ECHO: LV EF: 60% - 65%  Sees cardiologist ([facility])   Neuro/Psych negative neurological ROS  negative psych ROS   GI/Hepatic negative GI ROS, Neg liver ROS,   Endo/Other  negative endocrine ROS  Renal/GU negative Renal ROS     Musculoskeletal negative musculoskeletal ROS (+)   Abdominal (+) + obese,   Peds  Hematology HLD   Anesthesia Other Findings   Reproductive/Obstetrics                            Anesthesia Physical Anesthesia Plan  ASA: III  Anesthesia Plan: General and Regional   Post-op Pain Management: GA combined w/ Regional for post-op pain   Induction: Intravenous  PONV Risk Score and Plan: 2 and Dexamethasone, Ondansetron, Midazolam and Treatment may vary due to age or medical condition  Airway Management Planned: Oral ETT  Additional Equipment:   Intra-op Plan:   Post-operative Plan: Extubation in OR  Informed Consent: I have reviewed the patients History and Physical, chart, labs and discussed the procedure including the risks, benefits and alternatives for the proposed anesthesia with the patient or authorized representative who has indicated his/her understanding and acceptance.   Dental advisory given  Plan  Discussed with: CRNA  Anesthesia Plan Comments:        Anesthesia Quick Evaluation

## 2017-12-24 ENCOUNTER — Inpatient Hospital Stay (HOSPITAL_COMMUNITY): Payer: Medicare Other | Admitting: Emergency Medicine

## 2017-12-24 ENCOUNTER — Inpatient Hospital Stay (HOSPITAL_COMMUNITY): Payer: Medicare Other | Admitting: Certified Registered"

## 2017-12-24 ENCOUNTER — Encounter (HOSPITAL_COMMUNITY): Payer: Self-pay | Admitting: Urology

## 2017-12-24 ENCOUNTER — Encounter (HOSPITAL_COMMUNITY)
Admission: RE | Disposition: A | Discharge status: Home / Self Care | Payer: Self-pay | Source: Ambulatory Visit | Attending: Orthopedic Surgery

## 2017-12-24 ENCOUNTER — Inpatient Hospital Stay (HOSPITAL_COMMUNITY)
Admission: RE | Admit: 2017-12-24 | Discharge: 2017-12-25 | DRG: 483 | Disposition: A | Discharge status: Home / Self Care | Payer: Medicare Other | Source: Ambulatory Visit | Attending: Orthopedic Surgery | Admitting: Orthopedic Surgery

## 2017-12-24 ENCOUNTER — Other Ambulatory Visit: Payer: Self-pay

## 2017-12-24 ENCOUNTER — Inpatient Hospital Stay (HOSPITAL_COMMUNITY): Payer: Medicare Other

## 2017-12-24 DIAGNOSIS — Z96611 Presence of right artificial shoulder joint: Secondary | ICD-10-CM

## 2017-12-24 DIAGNOSIS — E785 Hyperlipidemia, unspecified: Secondary | ICD-10-CM | POA: Diagnosis not present

## 2017-12-24 DIAGNOSIS — Z471 Aftercare following joint replacement surgery: Secondary | ICD-10-CM | POA: Diagnosis not present

## 2017-12-24 DIAGNOSIS — Z6832 Body mass index (BMI) 32.0-32.9, adult: Secondary | ICD-10-CM

## 2017-12-24 DIAGNOSIS — M19011 Primary osteoarthritis, right shoulder: Principal | ICD-10-CM | POA: Diagnosis present

## 2017-12-24 DIAGNOSIS — M7521 Bicipital tendinitis, right shoulder: Secondary | ICD-10-CM | POA: Diagnosis not present

## 2017-12-24 DIAGNOSIS — G4730 Sleep apnea, unspecified: Secondary | ICD-10-CM | POA: Diagnosis present

## 2017-12-24 DIAGNOSIS — Z955 Presence of coronary angioplasty implant and graft: Secondary | ICD-10-CM | POA: Diagnosis not present

## 2017-12-24 DIAGNOSIS — Z87891 Personal history of nicotine dependence: Secondary | ICD-10-CM | POA: Diagnosis not present

## 2017-12-24 DIAGNOSIS — I2510 Atherosclerotic heart disease of native coronary artery without angina pectoris: Secondary | ICD-10-CM | POA: Diagnosis present

## 2017-12-24 DIAGNOSIS — I10 Essential (primary) hypertension: Secondary | ICD-10-CM | POA: Diagnosis not present

## 2017-12-24 DIAGNOSIS — Z7982 Long term (current) use of aspirin: Secondary | ICD-10-CM | POA: Diagnosis not present

## 2017-12-24 DIAGNOSIS — E669 Obesity, unspecified: Secondary | ICD-10-CM | POA: Diagnosis present

## 2017-12-24 DIAGNOSIS — G8918 Other acute postprocedural pain: Secondary | ICD-10-CM | POA: Diagnosis not present

## 2017-12-24 DIAGNOSIS — Z79899 Other long term (current) drug therapy: Secondary | ICD-10-CM | POA: Diagnosis not present

## 2017-12-24 DIAGNOSIS — Z85828 Personal history of other malignant neoplasm of skin: Secondary | ICD-10-CM

## 2017-12-24 DIAGNOSIS — J449 Chronic obstructive pulmonary disease, unspecified: Secondary | ICD-10-CM | POA: Diagnosis not present

## 2017-12-24 HISTORY — PX: TOTAL SHOULDER ARTHROPLASTY: SHX126

## 2017-12-24 SURGERY — ARTHROPLASTY, SHOULDER, TOTAL
Anesthesia: Regional | Site: Shoulder | Laterality: Right

## 2017-12-24 MED ORDER — ASPIRIN EC 81 MG PO TBEC
81.0000 mg | DELAYED_RELEASE_TABLET | Freq: Two times a day (BID) | ORAL | Status: DC
  Administered 2017-12-24 – 2017-12-25 (×2): 81 mg via ORAL
  Filled 2017-12-24 (×2): qty 1

## 2017-12-24 MED ORDER — 0.9 % SODIUM CHLORIDE (POUR BTL) OPTIME
TOPICAL | Status: DC | PRN
  Administered 2017-12-24: 1000 mL

## 2017-12-24 MED ORDER — ALUMINUM HYDROXIDE GEL 320 MG/5ML PO SUSP
15.0000 mL | ORAL | Status: DC | PRN
  Filled 2017-12-24: qty 30

## 2017-12-24 MED ORDER — DOCUSATE SODIUM 100 MG PO CAPS: 100.0000 mg | ORAL_CAPSULE | Freq: Three times a day (TID) | ORAL | 0 refills | Status: DC | PRN

## 2017-12-24 MED ORDER — ONDANSETRON HCL 4 MG/2ML IJ SOLN
INTRAMUSCULAR | Status: DC | PRN
  Administered 2017-12-24: 4 mg via INTRAVENOUS

## 2017-12-24 MED ORDER — ROCURONIUM BROMIDE 50 MG/5ML IV SOSY
PREFILLED_SYRINGE | INTRAVENOUS | Status: AC
  Filled 2017-12-24: qty 5

## 2017-12-24 MED ORDER — ACETAMINOPHEN 500 MG PO TABS
1000.0000 mg | ORAL_TABLET | Freq: Once | ORAL | Status: AC
  Administered 2017-12-24: 1000 mg via ORAL

## 2017-12-24 MED ORDER — CEFAZOLIN SODIUM-DEXTROSE 2-4 GM/100ML-% IV SOLN
2.0000 g | Freq: Four times a day (QID) | INTRAVENOUS | Status: AC
  Administered 2017-12-24 – 2017-12-25 (×3): 2 g via INTRAVENOUS
  Filled 2017-12-24 (×3): qty 100

## 2017-12-24 MED ORDER — OXYCODONE HCL 5 MG PO TABS: 10.0000 mg | ORAL_TABLET | ORAL | Status: DC | PRN

## 2017-12-24 MED ORDER — HYDROMORPHONE HCL 1 MG/ML IJ SOLN: 0.5000 mg | INTRAMUSCULAR | Status: DC | PRN

## 2017-12-24 MED ORDER — LIDOCAINE 2% (20 MG/ML) 5 ML SYRINGE
INTRAMUSCULAR | Status: DC | PRN
  Administered 2017-12-24: 40 mg via INTRAVENOUS

## 2017-12-24 MED ORDER — DOCUSATE SODIUM 100 MG PO CAPS
100.0000 mg | ORAL_CAPSULE | Freq: Two times a day (BID) | ORAL | Status: DC
  Administered 2017-12-24 – 2017-12-25 (×2): 100 mg via ORAL
  Filled 2017-12-24 (×2): qty 1

## 2017-12-24 MED ORDER — ACETAMINOPHEN 500 MG PO TABS
ORAL_TABLET | ORAL | Status: AC
  Administered 2017-12-24: 1000 mg via ORAL
  Filled 2017-12-24: qty 2

## 2017-12-24 MED ORDER — PHENYLEPHRINE 40 MCG/ML (10ML) SYRINGE FOR IV PUSH (FOR BLOOD PRESSURE SUPPORT)
PREFILLED_SYRINGE | INTRAVENOUS | Status: DC | PRN
  Administered 2017-12-24 (×4): 80 ug via INTRAVENOUS

## 2017-12-24 MED ORDER — METOCLOPRAMIDE HCL 5 MG PO TABS: 5.0000 mg | ORAL_TABLET | Freq: Three times a day (TID) | ORAL | Status: DC | PRN

## 2017-12-24 MED ORDER — SODIUM CHLORIDE 0.9 % IR SOLN
Status: DC | PRN
  Administered 2017-12-24: 3000 mL

## 2017-12-24 MED ORDER — BUPIVACAINE HCL (PF) 0.5 % IJ SOLN
INTRAMUSCULAR | Status: DC | PRN
  Administered 2017-12-24: 15 mL via PERINEURAL

## 2017-12-24 MED ORDER — ACETAMINOPHEN 325 MG PO TABS
325.0000 mg | ORAL_TABLET | Freq: Four times a day (QID) | ORAL | Status: DC | PRN
  Administered 2017-12-25: 650 mg via ORAL
  Filled 2017-12-24 (×2): qty 2

## 2017-12-24 MED ORDER — METOCLOPRAMIDE HCL 5 MG/ML IJ SOLN: 5.0000 mg | Freq: Three times a day (TID) | INTRAMUSCULAR | Status: DC | PRN

## 2017-12-24 MED ORDER — HEMOSTATIC AGENTS (NO CHARGE) OPTIME
TOPICAL | Status: DC | PRN
  Administered 2017-12-24: 1 via TOPICAL

## 2017-12-24 MED ORDER — SODIUM CHLORIDE 0.9 % IV SOLN
INTRAVENOUS | Status: DC | PRN
  Administered 2017-12-24: 50 ug/min via INTRAVENOUS

## 2017-12-24 MED ORDER — MENTHOL 3 MG MT LOZG: 1.0000 | LOZENGE | OROMUCOSAL | Status: DC | PRN

## 2017-12-24 MED ORDER — SODIUM CHLORIDE 0.9 % IV SOLN
INTRAVENOUS | Status: AC
  Administered 2017-12-24: 14:00:00 via INTRAVENOUS

## 2017-12-24 MED ORDER — ONDANSETRON HCL 4 MG/2ML IJ SOLN: 4.0000 mg | Freq: Four times a day (QID) | INTRAMUSCULAR | Status: DC | PRN

## 2017-12-24 MED ORDER — ZOLPIDEM TARTRATE 5 MG PO TABS
5.0000 mg | ORAL_TABLET | Freq: Every evening | ORAL | Status: DC | PRN
Start: 2017-12-24 — End: 2017-12-25

## 2017-12-24 MED ORDER — METHOCARBAMOL 500 MG PO TABS: 500.0000 mg | ORAL_TABLET | Freq: Four times a day (QID) | ORAL | Status: DC | PRN

## 2017-12-24 MED ORDER — FENTANYL CITRATE (PF) 100 MCG/2ML IJ SOLN
INTRAMUSCULAR | Status: DC | PRN
  Administered 2017-12-24 (×2): 50 ug via INTRAVENOUS

## 2017-12-24 MED ORDER — METOPROLOL SUCCINATE ER 25 MG PO TB24
25.0000 mg | ORAL_TABLET | Freq: Every day | ORAL | Status: DC
  Administered 2017-12-25: 25 mg via ORAL
  Filled 2017-12-24: qty 1

## 2017-12-24 MED ORDER — PROPOFOL 10 MG/ML IV BOLUS
INTRAVENOUS | Status: AC
  Filled 2017-12-24: qty 20

## 2017-12-24 MED ORDER — LACTATED RINGERS IV SOLN
INTRAVENOUS | Status: DC
  Administered 2017-12-24: 06:00:00 via INTRAVENOUS

## 2017-12-24 MED ORDER — DIPHENHYDRAMINE HCL 12.5 MG/5ML PO ELIX
12.5000 mg | ORAL_SOLUTION | ORAL | Status: DC | PRN
  Administered 2017-12-24: 25 mg via ORAL
  Filled 2017-12-24: qty 10

## 2017-12-24 MED ORDER — ONDANSETRON HCL 4 MG/2ML IJ SOLN: 4.0000 mg | Freq: Once | INTRAMUSCULAR | Status: DC | PRN

## 2017-12-24 MED ORDER — FLEET ENEMA 7-19 GM/118ML RE ENEM: 1.0000 | ENEMA | Freq: Once | RECTAL | Status: DC | PRN

## 2017-12-24 MED ORDER — SENNOSIDES-DOCUSATE SODIUM 8.6-50 MG PO TABS: 1.0000 | ORAL_TABLET | Freq: Every evening | ORAL | Status: DC | PRN

## 2017-12-24 MED ORDER — BISACODYL 5 MG PO TBEC: 5.0000 mg | DELAYED_RELEASE_TABLET | Freq: Every day | ORAL | Status: DC | PRN

## 2017-12-24 MED ORDER — LIDOCAINE 2% (20 MG/ML) 5 ML SYRINGE
INTRAMUSCULAR | Status: AC
  Filled 2017-12-24: qty 5

## 2017-12-24 MED ORDER — PHENOL 1.4 % MT LIQD: 1.0000 | OROMUCOSAL | Status: DC | PRN

## 2017-12-24 MED ORDER — OXYCODONE HCL 5 MG PO TABS: 5.0000 mg | ORAL_TABLET | ORAL | Status: DC | PRN

## 2017-12-24 MED ORDER — ROCURONIUM BROMIDE 10 MG/ML (PF) SYRINGE
PREFILLED_SYRINGE | INTRAVENOUS | Status: DC | PRN
  Administered 2017-12-24: 30 mg via INTRAVENOUS

## 2017-12-24 MED ORDER — ONDANSETRON HCL 4 MG PO TABS: 4.0000 mg | ORAL_TABLET | Freq: Four times a day (QID) | ORAL | Status: DC | PRN

## 2017-12-24 MED ORDER — OXYCODONE-ACETAMINOPHEN 5-325 MG PO TABS: 1.0000 | ORAL_TABLET | ORAL | 0 refills | Status: AC | PRN

## 2017-12-24 MED ORDER — SIMVASTATIN 20 MG PO TABS
20.0000 mg | ORAL_TABLET | Freq: Every day | ORAL | Status: DC
  Administered 2017-12-24: 20 mg via ORAL
  Filled 2017-12-24: qty 1

## 2017-12-24 MED ORDER — PHENYLEPHRINE 40 MCG/ML (10ML) SYRINGE FOR IV PUSH (FOR BLOOD PRESSURE SUPPORT)
PREFILLED_SYRINGE | INTRAVENOUS | Status: AC
  Filled 2017-12-24: qty 10

## 2017-12-24 MED ORDER — METHOCARBAMOL 1000 MG/10ML IJ SOLN
500.0000 mg | Freq: Four times a day (QID) | INTRAVENOUS | Status: DC | PRN
  Filled 2017-12-24: qty 5

## 2017-12-24 MED ORDER — FENTANYL CITRATE (PF) 100 MCG/2ML IJ SOLN: 25.0000 ug | INTRAMUSCULAR | Status: DC | PRN

## 2017-12-24 MED ORDER — MIDAZOLAM HCL 5 MG/5ML IJ SOLN
INTRAMUSCULAR | Status: DC | PRN
  Administered 2017-12-24: 2 mg via INTRAVENOUS

## 2017-12-24 MED ORDER — MIDAZOLAM HCL 2 MG/2ML IJ SOLN
INTRAMUSCULAR | Status: AC
  Filled 2017-12-24: qty 2

## 2017-12-24 MED ORDER — DEXAMETHASONE SODIUM PHOSPHATE 10 MG/ML IJ SOLN
INTRAMUSCULAR | Status: DC | PRN
  Administered 2017-12-24: 10 mg via INTRAVENOUS

## 2017-12-24 MED ORDER — PROPOFOL 10 MG/ML IV BOLUS
INTRAVENOUS | Status: DC | PRN
  Administered 2017-12-24: 200 mg via INTRAVENOUS

## 2017-12-24 MED ORDER — BUPIVACAINE LIPOSOME 1.3 % IJ SUSP
INTRAMUSCULAR | Status: DC | PRN
  Administered 2017-12-24: 10 mL via PERINEURAL

## 2017-12-24 MED ORDER — POVIDONE-IODINE 7.5 % EX SOLN: Freq: Once | CUTANEOUS | Status: DC

## 2017-12-24 MED ORDER — METHOCARBAMOL 500 MG PO TABS: 500.0000 mg | ORAL_TABLET | Freq: Three times a day (TID) | ORAL | 0 refills | Status: DC

## 2017-12-24 MED ORDER — FENTANYL CITRATE (PF) 250 MCG/5ML IJ SOLN
INTRAMUSCULAR | Status: AC
  Filled 2017-12-24: qty 5

## 2017-12-24 SURGICAL SUPPLY — 78 items
AID PSTN UNV HD RSTRNT DISP (MISCELLANEOUS) ×1
BIT DRILL 5/64X5 DISP (BIT) ×1 IMPLANT
BLADE SAW SAG 73X25 THK (BLADE) ×2
BLADE SAW SGTL 73X25 THK (BLADE) ×1 IMPLANT
BLADE SURG 15 STRL LF DISP TIS (BLADE) ×1 IMPLANT
BLADE SURG 15 STRL SS (BLADE) ×3
CEMENT BONE DEPUY (Cement) ×2 IMPLANT
CHLORAPREP W/TINT 26ML (MISCELLANEOUS) ×3 IMPLANT
CLOSURE STERI-STRIP 1/2X4 (GAUZE/BANDAGES/DRESSINGS) ×1
CLOSURE WOUND 1/2 X4 (GAUZE/BANDAGES/DRESSINGS)
CLSR STERI-STRIP ANTIMIC 1/2X4 (GAUZE/BANDAGES/DRESSINGS) ×1 IMPLANT
COVER SURGICAL LIGHT HANDLE (MISCELLANEOUS) ×3 IMPLANT
DRAPE INCISE IOBAN 66X45 STRL (DRAPES) ×3 IMPLANT
DRAPE ORTHO SPLIT 77X108 STRL (DRAPES) ×6
DRAPE SURG 17X23 STRL (DRAPES) ×3 IMPLANT
DRAPE SURG ORHT 6 SPLT 77X108 (DRAPES) ×2 IMPLANT
DRAPE U-SHAPE 47X51 STRL (DRAPES) ×3 IMPLANT
DRSG AQUACEL AG ADV 3.5X 6 (GAUZE/BANDAGES/DRESSINGS) ×2 IMPLANT
DRSG AQUACEL AG ADV 3.5X10 (GAUZE/BANDAGES/DRESSINGS) IMPLANT
ELECT BLADE 4.0 EZ CLEAN MEGAD (MISCELLANEOUS) ×3
ELECT REM PT RETURN 9FT ADLT (ELECTROSURGICAL) ×3
ELECTRODE BLDE 4.0 EZ CLN MEGD (MISCELLANEOUS) IMPLANT
ELECTRODE REM PT RTRN 9FT ADLT (ELECTROSURGICAL) ×1 IMPLANT
GLENOID CORTILOC AEQUALIS  L60 (Shoulder) ×2 IMPLANT
GLENOID CORTILOC AEQUALIS L60 (Shoulder) IMPLANT
GLOVE BIO SURGEON STRL SZ7 (GLOVE) ×3 IMPLANT
GLOVE BIO SURGEON STRL SZ7.5 (GLOVE) ×3 IMPLANT
GLOVE BIOGEL PI IND STRL 6.5 (GLOVE) IMPLANT
GLOVE BIOGEL PI IND STRL 7.0 (GLOVE) ×1 IMPLANT
GLOVE BIOGEL PI IND STRL 8 (GLOVE) ×1 IMPLANT
GLOVE BIOGEL PI INDICATOR 6.5 (GLOVE) ×2
GLOVE BIOGEL PI INDICATOR 7.0 (GLOVE) ×4
GLOVE BIOGEL PI INDICATOR 8 (GLOVE) ×2
GLOVE SURG SS PI 6.5 STRL IVOR (GLOVE) ×2 IMPLANT
GLOVE SURG SS PI 7.0 STRL IVOR (GLOVE) ×2 IMPLANT
GOWN STRL REUS W/ TWL LRG LVL3 (GOWN DISPOSABLE) ×1 IMPLANT
GOWN STRL REUS W/ TWL XL LVL3 (GOWN DISPOSABLE) ×1 IMPLANT
GOWN STRL REUS W/TWL LRG LVL3 (GOWN DISPOSABLE) ×9
GOWN STRL REUS W/TWL XL LVL3 (GOWN DISPOSABLE) ×3
GUIDEWIRE GLENOID 2.5X220 (WIRE) ×2 IMPLANT
HANDPIECE INTERPULSE COAX TIP (DISPOSABLE) ×3
HEAD HUM AEQUALIS 52X19 (Head) ×2 IMPLANT
HEMOSTAT SURGICEL 2X14 (HEMOSTASIS) ×3 IMPLANT
HOOD PEEL AWAY FLYTE STAYCOOL (MISCELLANEOUS) ×8 IMPLANT
KIT BASIN OR (CUSTOM PROCEDURE TRAY) ×3 IMPLANT
KIT TURNOVER KIT B (KITS) ×3 IMPLANT
MANIFOLD NEPTUNE II (INSTRUMENTS) ×3 IMPLANT
NDL MAYO TROCAR (NEEDLE) ×1 IMPLANT
NEEDLE MAYO TROCAR (NEEDLE) ×3 IMPLANT
NS IRRIG 1000ML POUR BTL (IV SOLUTION) ×3 IMPLANT
PACK SHOULDER (CUSTOM PROCEDURE TRAY) ×3 IMPLANT
PAD ARMBOARD 7.5X6 YLW CONV (MISCELLANEOUS) ×6 IMPLANT
RESTRAINT HEAD UNIVERSAL NS (MISCELLANEOUS) ×3 IMPLANT
RETRIEVER SUT HEWSON (MISCELLANEOUS) ×3 IMPLANT
SET HNDPC FAN SPRY TIP SCT (DISPOSABLE) ×1 IMPLANT
SLING ARM FOAM STRAP LRG (SOFTGOODS) ×1 IMPLANT
SLING ARM FOAM STRAP XLG (SOFTGOODS) ×2 IMPLANT
SMARTMIX MINI TOWER (MISCELLANEOUS) ×3
SPONGE LAP 18X18 X RAY DECT (DISPOSABLE) ×3 IMPLANT
SPONGE LAP 4X18 RFD (DISPOSABLE) IMPLANT
STEM HUMERAL PTC 6C 137.5 86 (Stem) ×2 IMPLANT
STRIP CLOSURE SKIN 1/2X4 (GAUZE/BANDAGES/DRESSINGS) ×1 IMPLANT
SUCTION FRAZIER HANDLE 10FR (MISCELLANEOUS) ×2
SUCTION TUBE FRAZIER 10FR DISP (MISCELLANEOUS) ×1 IMPLANT
SUPPORT WRAP ARM LG (MISCELLANEOUS) ×3 IMPLANT
SUT ETHIBOND NAB CT1 #1 30IN (SUTURE) ×9 IMPLANT
SUT FIBERWIRE #2 38 T-5 BLUE (SUTURE)
SUT MNCRL AB 4-0 PS2 18 (SUTURE) ×3 IMPLANT
SUT VIC AB 2-0 CT1 27 (SUTURE) ×3
SUT VIC AB 2-0 CT1 TAPERPNT 27 (SUTURE) ×1 IMPLANT
SUTURE FIBERWR #2 38 T-5 BLUE (SUTURE) IMPLANT
TAPE LABRALWHITE 1.5X36 (TAPE) ×3 IMPLANT
TAPE SUT LABRALTAP WHT/BLK (SUTURE) ×3 IMPLANT
TOWEL OR 17X26 10 PK STRL BLUE (TOWEL DISPOSABLE) ×3 IMPLANT
TOWER SMARTMIX MINI (MISCELLANEOUS) ×1 IMPLANT
TUBE CONNECTING 20'X1/4 (TUBING) ×1
TUBE CONNECTING 20X1/4 (TUBING) ×1 IMPLANT
YANKAUER SUCT BULB TIP NO VENT (SUCTIONS) ×2 IMPLANT

## 2017-12-24 NOTE — Anesthesia Procedure Notes (Signed)
Anesthesia Regional Block: Interscalene brachial plexus block   Pre-Anesthetic Checklist: ,, timeout performed, Correct Patient, Correct Site, Correct Laterality, Correct Procedure,, site marked, risks and benefits discussed, Surgical consent,  Pre-op evaluation,  At surgeon's request and post-op pain management  Laterality: Right  Prep: chloraprep       Needles:  Injection technique: Single-shot  Needle Type: Echogenic Stimulator Needle     Needle Length: 9cm  Needle Gauge: 21     Additional Needles:   Procedures:,,,, ultrasound used (permanent image in chart),,,,  Narrative:  Start time: 12/24/2017 7:10 AM End time: 12/24/2017 7:20 AM Injection made incrementally with aspirations every 5 mL.  Performed by: Personally  Anesthesiologist: Murvin Natal, MD  Additional Notes: Functioning IV was confirmed and monitors were applied.  A 75mm 21ga Arrow echogenic stimulator needle was used. Sterile prep, hand hygiene and sterile gloves were used.  Negative aspiration and negative test dose prior to incremental administration of local anesthetic. The patient tolerated the procedure well.

## 2017-12-24 NOTE — Discharge Instructions (Signed)

## 2017-12-24 NOTE — Op Note (Signed)
Procedure(s): RIGHT TOTAL SHOULDER ARTHROPLASTY Procedure Note  Omar Ruiz male 67 y.o. 12/24/2017  Procedure(s) and Anesthesia Type:    #1 RIGHT TOTAL SHOULDER ARTHROPLASTY - General     #2 right shoulder proximal long head biceps tenodesis  Surgeon(s) and Role:    Tania Ade, MD - Primary   Indications:  67 y.o. male  With endstage right shoulder arthritis. Pain and dysfunction interfered with quality of life and nonoperative treatment with activity modification, NSAIDS and injections failed.     Surgeon: Isabella Stalling   Assistants: Jeanmarie Hubert PA-C [facility] was present and scrubbed throughout the procedure and was essential in positioning, retraction, exposure, and closure)  Anesthesia: General endotracheal anesthesia with preoperative interscalene block given by attending anesthesiologist       Findings: Tornier flex anatomic press-fit size 6 stem with a 52 x 19 high offset head, cemented size 60 large Cortiloc glenoid.   A lesser tuberosity osteotomy was performed and repaired at the conclusion of the procedure.  Estimated Blood Loss:  200 mL         Drains: None   Blood Given: none          Specimens: none        Complications:  * No complications entered in OR log *         Disposition: PACU - hemodynamically stable.         Condition: stable    Procedure:   The patient was identified in the preoperative holding area where I personally marked the operative extremity after verifying with the patient and consent. He  was taken to the operating room where He was transferred to the   operative table.  The patient received an interscalene block in   the holding area by the attending anesthesiologist.  General anesthesia was induced   in the operating room without complication.  The patient did receive IV  Ancef prior to the commencement of the procedure.  The patient was   placed in the beach-chair position with the back raised about 30    degrees.  The nonoperative extremity and head and neck were carefully   positioned and padded protecting against neurovascular compromise.  The   left upper extremity was then prepped and draped in the standard sterile   fashion.    The appropriate operative time-out was performed with   Anesthesia, the perioperative staff, as well as myself and we all agreed   that the right side was the correct operative site. The patient received 1 g IV tranexamic acid at the start of the case around time of the incision.  An approximately   10 cm incision was made from the tip of the coracoid to the center point of the   humerus at the level of the axilla.  Dissection was carried down sharply   through subcutaneous tissues and cephalic vein was identified and taken   laterally with the deltoid.  The pectoralis major was taken medially.  The   upper 1 cm of the pectoralis major was released from its attachment on   the humerus.  The clavipectoral fascia was incised just lateral to the   conjoined tendon.  This incision was carried up to but not into the   coracoacromial ligament.  Digital palpation was used to prove   integrity of the axillary nerve which was protected throughout the   procedure.  Musculocutaneous nerve was not palpated in the operative   field.  Conjoined  tendon was then retracted gently medially and the   deltoid laterally.  Anterior circumflex humeral vessels were clamped and   coagulated.  The soft tissues overlying the biceps was incised and this   incision was carried across the transverse humeral ligament to the base   of the coracoid.  The biceps was noted to be severely degenerated. It was released from the superior labrum. The biceps was then tenodesed to the soft tissue just above   pectoralis major and the remaining portion of the biceps superiorly was   excised.  An osteotomy was performed at the lesser tuberosity.  The capsule was then   released all the way down to the 6  o'clock position of the humeral head.   The humeral head was then delivered with simultaneous adduction,   extension and external rotation.  All humeral osteophytes were removed   and the anatomic neck of the humerus was marked and cut free hand at   approximately 25 degrees retroversion within about 3 mm of the cuff   reflection posteriorly.  The head size was estimated to be a 52 medium   offset.  At that point, the humeral head was retracted posteriorly with   a Fukuda retractor.   Remaining portion of the capsule was released at the base of the   coracoid.  The remaining biceps anchor and the entire anterior-inferior   labrum was excised.  The posterior labrum was also excised but the   posterior capsule was not released.  The guidepin was placed bicortically with non elevated guide.  The reamer was used to ream to concentric bone with punctate bleeding.  This gave an excellent concentric surface.  The center hole was then drilled for an anchor peg glenoid followed by the three peripheral holes and none of the holes   exited the glenoid wall.  I then pulse irrigated these holes and dried   them with Surgicel.  The three peripheral holes were then   pressurized cemented and the anchor peg glenoid was placed and impacted   with an excellent fit.  The glenoid was a 60 large component.  The proximal humerus was then again exposed taking care not to displace the glenoid.    The entry awl was used followed by sounding reamers and then sequentially broached from size 2-6. This was then left in place and the calcar planer was used. Trial head was placed with a 52 x 19.  With the trial implantation of the component,  there was approximately 50% posterior translation with immediate snap back to the   anatomic position.  With forward elevation, there was no tendency   towards posterior subluxation.   The trial was removed and the final implant was prepared on a back table.  The trial was removed and the  final implant was prepared on a back table.   3 small holes were drilled on the medial side of the lesser tuberosity osteotomy, through which 2 labral tapes were passed. The implant was then placed through the loop of the 2 labral tapes and impacted with an excellent press-fit. This achieved excellent anatomic reconstruction of the proximal humerus.  The joint was then copiously irrigated with pulse lavage.  The subscapularis and   lesser tuberosity osteotomy were then repaired using the 2 labral tapes previously passed in a double row fashion with horizontal mattress sutures medially brought over through bone tunnels tied over a bone bridge laterally.   One #1 Ethibond was placed at  the rotator interval just above   the lesser tuberosity. Copious irrigation was used. Skin was closed with 2-0 Vicryl sutures in the deep dermal layer and 4-0 Monocryl in a subcuticular  running fashion.  Sterile dressings were then applied including Aquacel.  The patient was placed in a sling and allowed to awaken from general anesthesia and taken to the recovery room in stable condition.      POSTOPERATIVE PLAN:  Early passive range of motion will be allowed with the goal of 0 degrees external rotation and 90 degrees forward elevation.  No internal rotation at this time.  No active motion of the arm until the lesser tuberosity heals.  The patient will likely be kept in the hospital for 1-2 days and then discharged home.

## 2017-12-24 NOTE — Transfer of Care (Signed)
Immediate Anesthesia Transfer of Care Note  Patient: Omar Ruiz  Procedure(s) Performed: RIGHT TOTAL SHOULDER ARTHROPLASTY (Right Shoulder)  Patient Location: PACU  Anesthesia Type:GA combined with regional for post-op pain  Level of Consciousness: awake, oriented and patient cooperative  Airway & Oxygen Therapy: Patient Spontanous Breathing and Patient connected to nasal cannula oxygen  Post-op Assessment: Report given to RN, Post -op Vital signs reviewed and stable and Patient moving all extremities  Post vital signs: Reviewed and stable  Last Vitals:  Vitals Value Taken Time  BP 140/91 12/24/2017  9:30 AM  Temp    Pulse 74 12/24/2017  9:31 AM  Resp 13 12/24/2017  9:31 AM  SpO2 95 % 12/24/2017  9:31 AM  Vitals shown include unvalidated device data.  Last Pain:  Vitals:   12/24/17 0557  TempSrc:   PainSc: 1          Complications: No apparent anesthesia complications

## 2017-12-24 NOTE — H&P (Signed)
Omar Ruiz is an 67 y.o. male.   Chief Complaint: Right shoulder pain and dysfunction HPI: Endstage right shoulder arthritis with significant pain and dysfunction, failed conservative measures.  Pain interferes with sleep and quality of life.   Past Medical History:  Diagnosis Date  . Arthritis   . Asthma   . CAD (coronary artery disease)    Stent in 09/05/2009  . Cancer ([place])    skin cancer on hand, cancer removed  . Chest pain 07/2004   2D Echo EF>55%  . COPD (chronic obstructive pulmonary disease) ([place])   . COPD, mild ([place])    pt states he had study done and he does not have this anymore  . Hyperlipemia   . Hypertension 10/2004   stress test EF 57%  . Obesity   . Palpitation   . Sleep apnea    CPAP 4.5 pressure setting    Past Surgical History:  Procedure Laterality Date  . COLONOSCOPY  10/2004  . COLONOSCOPY N/A 03/29/2013   Procedure: COLONOSCOPY;  Surgeon: [name], MD;  Location: [facility];  Service: Gastroenterology;  Laterality: N/A;  . NASAL SINUS SURGERY    . SKIN CANCER EXCISION     right hand   . STENTS  09/05/2009   3.0x53mm promus drug eluting stent for a 90% mid LAD artery stenosis    Family History  Problem Relation Age of Onset  . Diabetes Mother   . Heart disease Father   . Lung disease Unknown   . Asthma Unknown   . Diabetes Unknown   . CAD Brother        3 brothers hx of cad   Social History:  reports that he quit smoking about 36 years ago. He has never used smokeless tobacco. He reports that he drinks about 12.0 standard drinks of alcohol per week. He reports that he does not use drugs.  Allergies: No Known Allergies  Medications Prior to Admission  Medication Sig Dispense Refill  . acetaminophen (TYLENOL) 500 MG tablet Take 1,000 mg by mouth See admin instructions. Take 1000 mg by mouth in the morning at 0400 and take 1000 mg by mouth in the evening at 1600    . aspirin 81 MG tablet Take 81 mg by mouth See admin  instructions. Take 81 mg by mouth in the morning at 0400    . metoprolol succinate (TOPROL-XL) 25 MG 24 hr tablet Take 1 tablet (25 mg total) by mouth daily. (Patient taking differently: Take 25 mg by mouth See admin instructions. Take 25 mg by mouth in the morning at 0400) 90 tablet 3  . nitroGLYCERIN (NITROSTAT) 0.4 MG SL tablet Place 1 tablet (0.4 mg total) under the tongue every 5 (five) minutes as needed for chest pain. 25 tablet 3  . OVER THE COUNTER MEDICATION Apply 1 application topically 2 (two) times daily. CBD Sav    . simvastatin (ZOCOR) 20 MG tablet TAKE 1 TABLET BY MOUTH AT BEDTIME (Patient taking differently: Take 20 mg by mouth in the morning at 0400) 90 tablet 3  . Turmeric Curcumin 500 MG CAPS Take 500 mg by mouth daily.    . nabumetone (RELAFEN) 750 MG tablet Take 1 tablet (750 mg total) by mouth 2 (two) times daily. (Patient not taking: Reported on 12/14/2017) 30 tablet 0  . naproxen (NAPROSYN) 500 MG tablet Take 500 mg by mouth See admin instructions. Take 500 mg by mouth in the morning at 0400 and take 500 mg by  mouth in the evening at 1600    . nicotine (NICODERM CQ) 21 mg/24hr patch Place 1 patch (21 mg total) onto the skin daily. (Patient not taking: Reported on 12/14/2017) 28 patch 6    No results found for this or any previous visit (from the past 48 hour(s)). No results found.  Review of Systems  All other systems reviewed and are negative.   Blood pressure (!) 144/82, pulse 65, temperature 98.3 F (36.8 C), temperature source Oral, resp. rate 18, weight 111.1 kg, SpO2 94 %. Physical Exam  Constitutional: He is oriented to person, place, and time. He appears well-developed and well-nourished.  HENT:  Head: Atraumatic.  Eyes: EOM are normal.  Cardiovascular: Intact distal pulses.  Respiratory: Effort normal.  Musculoskeletal:  R shoulder pain with limited ROM. NVID.  Neurological: He is alert and oriented to person, place, and time.  Skin: Skin is warm and dry.   Psychiatric: He has a normal mood and affect.     Assessment/Plan R shoudler endstage arthritis Plan R TSA Risks / benefits of surgery discussed Consent on chart  NPO for OR Preop antibiotics   Isabella Stalling, MD 12/24/2017, 9:21 AM

## 2017-12-24 NOTE — Progress Notes (Signed)
1230 Received pt from PACU, A&O x4. Right shoulder incision with Aquacel dressing dry and intact. Ice pack in place. Strong right hand grips, denies numbness, no pain. Right arm sling on.

## 2017-12-24 NOTE — Anesthesia Procedure Notes (Signed)
Procedure Name: Intubation Date/Time: 12/24/2017 8:41 AM Performed by: Moshe Salisbury, CRNA Pre-anesthesia Checklist: Patient identified, Emergency Drugs available, Suction available and Patient being monitored Patient Re-evaluated:Patient Re-evaluated prior to induction Oxygen Delivery Method: Circle System Utilized Preoxygenation: Pre-oxygenation with 100% oxygen Induction Type: IV induction Ventilation: Mask ventilation without difficulty Laryngoscope Size: Mac and 4 Grade View: Grade II Tube type: Oral Tube size: 8.0 mm Number of attempts: 1 Airway Equipment and Method: Stylet Placement Confirmation: ETT inserted through vocal cords under direct vision,  positive ETCO2 and breath sounds checked- equal and bilateral Secured at: 23 cm Tube secured with: Tape Dental Injury: Teeth and Oropharynx as per pre-operative assessment

## 2017-12-24 NOTE — Progress Notes (Signed)
Late entry: patient setup on CPAP mode with setting per home regimen. Patient has no O2 bleeding in at this time, patient is using home mask and tubign with hospital machine. Tolerating well and will call if any further assistance needed.

## 2017-12-25 ENCOUNTER — Encounter (HOSPITAL_COMMUNITY): Payer: Self-pay | Admitting: Orthopedic Surgery

## 2017-12-25 LAB — BASIC METABOLIC PANEL
ANION GAP: 6 (ref 5–15)
BUN: 15 mg/dL (ref 8–23)
CO2: 26 mmol/L (ref 22–32)
Calcium: 8.3 mg/dL — ABNORMAL LOW (ref 8.9–10.3)
Chloride: 106 mmol/L (ref 98–111)
Creatinine, Ser: 1.19 mg/dL (ref 0.61–1.24)
GFR calc Af Amer: >60 mL/min (ref >60)
GFR calc non Af Amer: >60 mL/min (ref >60)
GLUCOSE: 146 mg/dL — AB (ref 70–99)
POTASSIUM: 4.5 mmol/L (ref 3.5–5.1)
Sodium: 138 mmol/L (ref 135–145)

## 2017-12-25 LAB — CBC
HEMATOCRIT: 38 % — AB (ref 39.0–52.0)
Hemoglobin: 12.8 g/dL — ABNORMAL LOW (ref 13.0–17.0)
MCH: 31.1 pg (ref 26.0–34.0)
MCHC: 33.7 g/dL (ref 30.0–36.0)
MCV: 92.2 fL (ref 78.0–100.0)
Platelets: 240 10*3/uL (ref 150–400)
RBC: 4.12 MIL/uL — AB (ref 4.22–5.81)
RDW: 13.4 % (ref 11.5–15.5)
WBC: 14.1 10*3/uL — AB (ref 4.0–10.5)

## 2017-12-25 MED ORDER — TRAMADOL HCL 50 MG PO TABS
50.0000 mg | ORAL_TABLET | Freq: Four times a day (QID) | ORAL | Status: DC | PRN
Start: 2017-12-25 — End: 2017-12-25
  Filled 2017-12-25: qty 1

## 2017-12-25 NOTE — Discharge Summary (Signed)
Patient ID: Omar Ruiz MRN: [id number] DOB/AGE: 1950-06-19 67 y.o.  Admit date: 12/24/2017 Discharge date: 12/25/2017  Admission Diagnoses:  Active Problems:   Status post total shoulder arthroplasty, right   Discharge Diagnoses:  Same  Past Medical History:  Diagnosis Date  . Arthritis   . Asthma   . CAD (coronary artery disease)    Stent in 09/05/2009  . Cancer ([place])    skin cancer on hand, cancer removed  . Chest pain 07/2004   2D Echo EF>55%  . COPD (chronic obstructive pulmonary disease) ([place])   . COPD, mild ([place])    pt states he had study done and he does not have this anymore  . Hyperlipemia   . Hypertension 10/2004   stress test EF 57%  . Obesity   . Palpitation   . Sleep apnea    CPAP 4.5 pressure setting    Surgeries: Procedure(s): RIGHT TOTAL SHOULDER ARTHROPLASTY on 12/24/2017   Consultants:   Discharged Condition: Improved  Hospital Course: Omar Ruiz is an 67 y.o. male who was admitted 12/24/2017 for operative treatment of right shoulder OA. Patient has severe unremitting pain that affects sleep, daily activities, and work/hobbies. After pre-op clearance the patient was taken to the operating room on 12/24/2017 and underwent  Procedure(s): RIGHT TOTAL SHOULDER ARTHROPLASTY.    Patient was given perioperative antibiotics:  Anti-infectives (From admission, onward)   Start     Dose/Rate Route Frequency Ordered Stop   12/24/17 1600  ceFAZolin (ANCEF) IVPB 2g/100 mL premix     2 g 200 mL/hr over 30 Minutes Intravenous Every 6 hours 12/24/17 1233 12/25/17 0520   12/24/17 0630  ceFAZolin (ANCEF) IVPB 2g/100 mL premix     2 g 200 mL/hr over 30 Minutes Intravenous To ShortStay Surgical 12/23/17 1052 12/24/17 0751   12/24/17 0600  vancomycin (VANCOCIN) 1,500 mg in sodium chloride 0.9 % 500 mL IVPB     1,500 mg 250 mL/hr over 120 Minutes Intravenous To Surgery 12/23/17 1205 12/24/17 0811   12/23/17 1145  vancomycin (VANCOCIN) 1,500 mg in sodium  chloride 0.9 % 500 mL IVPB  Status:  Discontinued     1,500 mg 250 mL/hr over 120 Minutes Intravenous To Surgery 12/23/17 1133 12/23/17 1201       Patient was given sequential compression devices, early ambulation, and chemoprophylaxis to prevent DVT.  Patient benefited maximally from hospital stay and there were no complications.    Recent vital signs:  Patient Vitals for the past 24 hrs:  BP Temp Temp src Pulse Resp SpO2 Height Weight  12/25/17 0424 106/68 98 F (36.7 C) Oral 65 19 94 % - -  12/24/17 2253 - - - 89 16 93 % - -  12/24/17 2024 120/75 98.3 F (36.8 C) Oral 79 16 93 % - -  12/24/17 1850 120/81 98.6 F (37 C) Oral 99 16 93 % - -  12/24/17 1749 - - - - - - 6\' 1"  (1.854 m) 111.1 kg  12/24/17 1221 135/84 - - 66 16 92 % - -  12/24/17 1200 130/86 97.8 F (36.6 C) - 67 13 97 % - -  12/24/17 1145 130/84 - - 62 14 96 % - -  12/24/17 1130 (!) 135/93 - - 62 17 96 % - -  12/24/17 1115 (!) 145/83 - - 60 16 96 % - -  12/24/17 1100 130/80 98.1 F (36.7 C) - 61 12 95 % - -  12/24/17 1045 132/85 - - 61 14  97 % - -  12/24/17 1030 127/84 - - 61 13 96 % - -  12/24/17 1015 134/79 - - 61 13 94 % - -  12/24/17 1001 132/81 - - 68 10 95 % - -  12/24/17 1000 - - - 65 11 96 % - -  12/24/17 0945 139/84 - - 69 (!) 9 92 % - -  12/24/17 0931 - - - 74 13 95 % - -  12/24/17 0930 (!) 140/91 (!) 97.5 F (36.4 C) - 70 15 94 % - -     Recent laboratory studies:  Recent Labs    12/25/17 0313  WBC 14.1*  HGB 12.8*  HCT 38.0*  PLT 240  NA 138  K 4.5  CL 106  CO2 26  BUN 15  CREATININE 1.19  GLUCOSE 146*  CALCIUM 8.3*     Discharge Medications:   Allergies as of 12/25/2017   No Known Allergies     Medication List    STOP taking these medications   naproxen 500 MG tablet Commonly known as:  NAPROSYN     TAKE these medications   acetaminophen 500 MG tablet Commonly known as:  TYLENOL Take 1,000 mg by mouth See admin instructions. Take 1000 mg by mouth in the morning at  0400 and take 1000 mg by mouth in the evening at 1600   aspirin 81 MG tablet Take 81 mg by mouth See admin instructions. Take 81 mg by mouth in the morning at 0400   docusate sodium 100 MG capsule Commonly known as:  COLACE Take 1 capsule (100 mg total) by mouth 3 (three) times daily as needed.   methocarbamol 500 MG tablet Commonly known as:  ROBAXIN Take 1 tablet (500 mg total) by mouth 3 (three) times daily.   metoprolol succinate 25 MG 24 hr tablet Commonly known as:  TOPROL-XL Take 1 tablet (25 mg total) by mouth daily. What changed:    when to take this  additional instructions   nabumetone 750 MG tablet Commonly known as:  RELAFEN Take 1 tablet (750 mg total) by mouth 2 (two) times daily.   nicotine 21 mg/24hr patch Commonly known as:  NICODERM CQ - dosed in mg/24 hours Place 1 patch (21 mg total) onto the skin daily.   nitroGLYCERIN 0.4 MG SL tablet Commonly known as:  NITROSTAT Place 1 tablet (0.4 mg total) under the tongue every 5 (five) minutes as needed for chest pain.   OVER THE COUNTER MEDICATION Apply 1 application topically 2 (two) times daily. CBD Sav   oxyCODONE-acetaminophen 5-325 MG tablet Commonly known as:  PERCOCET/ROXICET Take 1-2 tablets by mouth every 4 (four) hours as needed for severe pain.   simvastatin 20 MG tablet Commonly known as:  ZOCOR TAKE 1 TABLET BY MOUTH AT BEDTIME What changed:    how much to take  how to take this  when to take this   Turmeric Curcumin 500 MG Caps Take 500 mg by mouth daily.       Diagnostic Studies: Dg Chest 2 View  Result Date: 12/17/2017 CLINICAL DATA:  Preop for right shoulder replacement. EXAM: CHEST - 2 VIEW COMPARISON:  Radiographs of Sep 13, 2015. FINDINGS: The heart size and mediastinal contours are within normal limits. Both lungs are clear. The visualized skeletal structures are unremarkable. IMPRESSION: No active cardiopulmonary disease. Electronically Signed   By: Marijo Conception, M.D.    On: 12/17/2017 08:38   Ct Shoulder Right Wo Contrast  Result  Date: 12/14/2017 CLINICAL DATA:  Chronic right shoulder pain. EXAM: CT OF THE UPPER RIGHT EXTREMITY WITHOUT CONTRAST TECHNIQUE: Multidetector CT imaging of the upper right extremity was performed according to the standard protocol. COMPARISON:  Right shoulder x-rays dated November 02, 2017. FINDINGS: Bones/Joint/Cartilage No fracture or dislocation. Normal alignment. No joint effusion. Moderate to severe osteoarthritis of the glenohumeral joint with joint space narrowing, subchondral sclerosis, subchondral cystic changes and marginal osteophytosis. Mild arthropathy of the acromioclavicular joint.  Type II acromion. Ligaments Ligaments are suboptimally evaluated by CT. Muscles and Tendons Muscles are normal. No muscle atrophy. Rotator cuff is grossly intact. Soft tissue No fluid collection or hematoma. No soft tissue mass. The visualized right lung is clear. IMPRESSION: 1. Moderate to severe glenohumeral osteoarthritis. No acute osseous abnormality. Electronically Signed   By: Titus Dubin M.D.   On: 12/14/2017 14:52   Dg Shoulder Right Port  Result Date: 12/24/2017 CLINICAL DATA:  Status post right shoulder arthroplasty. EXAM: PORTABLE RIGHT SHOULDER COMPARISON:  Radiographs of November 02, 2017. FINDINGS: The humeral and glenoid components appear to be well situated. No fracture or dislocation is noted. IMPRESSION: Status post right shoulder arthroplasty. Electronically Signed   By: Marijo Conception, M.D.   On: 12/24/2017 10:11    Disposition:     Follow-up Information    Tania Ade, MD. Schedule an appointment as soon as possible for a visit in 2 weeks.   Specialty:  Orthopedic Surgery Contact information: [address] [place] [address] [phone]            Signed: Grier Mitts 12/25/2017, 8:17 AM

## 2017-12-25 NOTE — Anesthesia Postprocedure Evaluation (Signed)
Anesthesia Post Note  Patient: Omar Ruiz  Procedure(s) Performed: RIGHT TOTAL SHOULDER ARTHROPLASTY (Right Shoulder)     Patient location during evaluation: PACU Anesthesia Type: Regional and General Level of consciousness: awake and alert Pain management: pain level controlled Vital Signs Assessment: post-procedure vital signs reviewed and stable Respiratory status: spontaneous breathing, nonlabored ventilation, respiratory function stable and patient connected to nasal cannula oxygen Cardiovascular status: blood pressure returned to baseline and stable Postop Assessment: no apparent nausea or vomiting Anesthetic complications: no    Last Vitals:  Vitals:   12/24/17 2253 12/25/17 0424  BP:  106/68  Pulse: 89 65  Resp: 16 19  Temp:  36.7 C  SpO2: 93% 94%    Last Pain:  Vitals:   12/25/17 0424  TempSrc: Oral  PainSc:                  Nakiya Rallis P Jonnelle Lawniczak

## 2017-12-25 NOTE — Evaluation (Signed)
Occupational Therapy Evaluation and Discharge from acute OT Patient Details Name: Omar Ruiz MRN: [id number] DOB: 1951/02/02 Today's Date: 12/25/2017    History of Present Illness Pt is a 67 y/o male s/p total shoulder arthroplasty, right.    Clinical Impression   PTA Pt independent in ADL and transfers. 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 Shoulder dc handout reviewed in full with ADL focus. Also reviewed and practiced PROM exercises as ordered by MD. FF was particularly painful for Pt and he was only able to achieve 30 degrees today. At this time, acute education complete. Wife and Pt with no further questions or concerns. Follow up per MD protocol at 2 week appointment. Thank you for the opportunity to serve this patient and his family.     Follow Up Recommendations  Follow surgeon's recommendation for DC plan and follow-up therapies;Supervision - Intermittent    Equipment Recommendations  None recommended by OT    Recommendations for Other Services       Precautions / Restrictions Precautions Precautions: Shoulder Type of Shoulder Precautions: passive Shoulder Interventions: Shoulder sling/immobilizer;At all times;Off for dressing/bathing/exercises Precaution Booklet Issued: Yes (comment) Precaution Comments: reviewed precautions/compensatory strategies for ADL in full Required Braces or Orthoses: Sling Restrictions Weight Bearing Restrictions: Yes RUE Weight Bearing: Non weight bearing      Mobility Bed Mobility Overal bed mobility: Modified Independent                Transfers Overall transfer level: Modified independent                    Balance Overall balance assessment: No apparent balance deficits (not formally assessed)                                         ADL either performed or assessed with  clinical judgement   ADL Overall ADL's : Needs assistance/impaired                                       General ADL Comments: please see shoulder section below for more details     Vision Patient Visual Report: No change from baseline       Perception     Praxis      Pertinent Vitals/Pain Pain Assessment: 0-10 Pain Score: 9  Pain Location: R shoulder and elbow Pain Descriptors / Indicators: Operative site guarding;Burning;Sore;Shooting Pain Intervention(s): Limited activity within patient's tolerance;Monitored during session;Repositioned     Hand Dominance Right   Extremity/Trunk Assessment Upper Extremity Assessment Upper Extremity Assessment: RUE deficits/detail RUE Deficits / Details: anticipated deficits post-op RUE Sensation: WNL RUE Coordination: decreased gross motor   Lower Extremity Assessment Lower Extremity Assessment: Overall WFL for tasks assessed   Cervical / Trunk Assessment Cervical / Trunk Assessment: Normal   Communication Communication Communication: No difficulties   Cognition Arousal/Alertness: Awake/alert Behavior During Therapy: WFL for tasks assessed/performed Overall Cognitive Status: Within Functional Limits for tasks assessed                                     General Comments  wife present and active as caregiver throughout session    Exercises Exercises: Shoulder Shoulder Exercises Shoulder Flexion: PROM;Right;10 reps;Supine;Seated(educated to 90; able to get to 30 today) Shoulder External Rotation: PROM;Right;10 reps;Seated(to neutral) Elbow Flexion: AROM;Right;10 reps;Seated Elbow Extension: AROM;Right;10 reps;Seated Wrist Flexion: AROM;Right Wrist Extension: AROM;Right Digit Composite Flexion: AROM;Right Composite Extension: AROM;Right Neck Flexion: AROM Neck Extension: AROM Neck Lateral Flexion - Right: AROM Neck Lateral Flexion - Left: AROM   Shoulder Instructions Shoulder  Instructions Donning/doffing shirt without moving shoulder: Maximal assistance;Caregiver independent with task;Patient able to independently direct caregiver Method for sponge bathing under operated UE: Maximal assistance;Caregiver independent with task;Patient able to independently direct caregiver Donning/doffing sling/immobilizer: Maximal assistance;Caregiver independent with task;Patient able to independently direct caregiver Correct positioning of sling/immobilizer: Supervision/safety ROM for elbow, wrist and digits of operated UE: Modified independent Sling wearing schedule (on at all times/off for ADL's): Modified independent Proper positioning of operated UE when showering: Supervision/safety Positioning of UE while sleeping: [place] expects to be discharged to:: Private residence Living Arrangements: Spouse/significant other Available Help at Discharge: Family                                    Prior Functioning/Environment Level of Independence: Independent        Comments: works as a Administrator, 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        OT Problem List: Pain;Impaired UE functional use;Decreased knowledge of use of DME or AE;Decreased knowledge of precautions;Decreased range of motion      OT Treatment/Interventions:      OT Goals(Current goals can be found in the care plan section) Acute Rehab OT Goals Patient Stated Goal: to get home and get full ROM back OT Goal Formulation: With patient/family Time For Goal Achievement: 01/08/18 Potential to Achieve Goals: Good  OT Frequency:     Barriers to D/C:            Co-evaluation              AM-PAC PT "6 Clicks" Daily Activity     Outcome Measure Help from another person eating meals?: A Little Help from another person taking care of personal grooming?: A Little Help from another person toileting, which includes using toliet, bedpan, or urinal?:  None Help from another person bathing (including washing, rinsing, drying)?: A Lot Help from another person to put on and taking off regular upper body clothing?: A Lot Help from another person to put on and taking off regular lower body clothing?: A Little 6 Click Score: 17   End of Session Nurse Communication: Mobility status  Activity Tolerance: Patient tolerated treatment well Patient left: in bed;with call bell/phone within reach;with family/visitor present  OT Visit Diagnosis: Pain Pain - Right/Left: Right Pain - part of body: Shoulder                Time: 1761-6073 OT Time Calculation (min): 31 min Charges:  OT General Charges $OT Visit: 1 Visit OT Evaluation $OT Eval Moderate Complexity: 1 Mod OT Treatments $Self Care/Home Management : 8-22 mins  Hulda Humphrey OTR/L [place] 12/25/2017, 12:50 PM

## 2017-12-25 NOTE — Progress Notes (Signed)
   PATIENT ID: Omar Ruiz   1 Day Post-Op Procedure(s) (LRB): RIGHT TOTAL SHOULDER ARTHROPLASTY (Right)  Subjective: Doing well, in no pain. Block is still working. Feels good to go home today.   Objective:  Vitals:   12/24/17 2253 12/25/17 0424  BP:  106/68  Pulse: 89 65  Resp: 16 19  Temp:  98 F (36.7 C)  SpO2: 93% 94%     R UE dressing c/d/i Wiggles fingers, distally NVI  Labs:  Recent Labs    12/25/17 0313  HGB 12.8*   Recent Labs    12/25/17 0313  WBC 14.1*  RBC 4.12*  HCT 38.0*  PLT 240   Recent Labs    12/25/17 0313  NA 138  K 4.5  CL 106  CO2 26  BUN 15  CREATININE 1.19  GLUCOSE 146*  CALCIUM 8.3*    Assessment and Plan: 1 day s/p R TSA OT- PROM goal to 90 FF 0 ER Fu with Dr. Tamera Punt in 2 weeks D/c home, rx in chart  VTE proph: asa, scds

## 2018-01-06 DIAGNOSIS — M19011 Primary osteoarthritis, right shoulder: Secondary | ICD-10-CM | POA: Diagnosis not present

## 2018-01-06 DIAGNOSIS — Z471 Aftercare following joint replacement surgery: Secondary | ICD-10-CM | POA: Diagnosis not present

## 2018-01-06 DIAGNOSIS — Z96611 Presence of right artificial shoulder joint: Secondary | ICD-10-CM | POA: Diagnosis not present

## 2018-01-22 DIAGNOSIS — R000 Tachycardia, unspecified: Secondary | ICD-10-CM | POA: Diagnosis not present

## 2018-01-22 DIAGNOSIS — I10 Essential (primary) hypertension: Secondary | ICD-10-CM | POA: Diagnosis not present

## 2018-01-22 DIAGNOSIS — M25511 Pain in right shoulder: Secondary | ICD-10-CM | POA: Diagnosis not present

## 2018-01-22 DIAGNOSIS — E782 Mixed hyperlipidemia: Secondary | ICD-10-CM | POA: Diagnosis not present

## 2018-01-22 DIAGNOSIS — Z23 Encounter for immunization: Secondary | ICD-10-CM | POA: Diagnosis not present

## 2018-01-22 DIAGNOSIS — G4700 Insomnia, unspecified: Secondary | ICD-10-CM | POA: Diagnosis not present

## 2018-01-22 DIAGNOSIS — M19049 Primary osteoarthritis, unspecified hand: Secondary | ICD-10-CM | POA: Diagnosis not present

## 2018-01-22 DIAGNOSIS — Z0001 Encounter for general adult medical examination with abnormal findings: Secondary | ICD-10-CM | POA: Diagnosis not present

## 2018-01-25 DIAGNOSIS — G4733 Obstructive sleep apnea (adult) (pediatric): Secondary | ICD-10-CM | POA: Diagnosis not present

## 2018-02-01 DIAGNOSIS — M19011 Primary osteoarthritis, right shoulder: Secondary | ICD-10-CM | POA: Diagnosis not present

## 2018-02-01 DIAGNOSIS — M25611 Stiffness of right shoulder, not elsewhere classified: Secondary | ICD-10-CM | POA: Diagnosis not present

## 2018-02-01 DIAGNOSIS — Z96611 Presence of right artificial shoulder joint: Secondary | ICD-10-CM | POA: Diagnosis not present

## 2018-02-02 DIAGNOSIS — Z96611 Presence of right artificial shoulder joint: Secondary | ICD-10-CM | POA: Diagnosis not present

## 2018-02-02 DIAGNOSIS — M25611 Stiffness of right shoulder, not elsewhere classified: Secondary | ICD-10-CM | POA: Diagnosis not present

## 2018-02-08 ENCOUNTER — Other Ambulatory Visit: Payer: Self-pay

## 2018-02-08 DIAGNOSIS — M25611 Stiffness of right shoulder, not elsewhere classified: Secondary | ICD-10-CM | POA: Diagnosis not present

## 2018-02-08 DIAGNOSIS — Z96611 Presence of right artificial shoulder joint: Secondary | ICD-10-CM | POA: Diagnosis not present

## 2018-02-08 MED ORDER — METOPROLOL SUCCINATE ER 25 MG PO TB24: 25.0000 mg | ORAL_TABLET | Freq: Every day | ORAL | 3 refills | Status: DC

## 2018-02-08 NOTE — Telephone Encounter (Signed)
Refill toprol

## 2018-02-11 DIAGNOSIS — M25611 Stiffness of right shoulder, not elsewhere classified: Secondary | ICD-10-CM | POA: Diagnosis not present

## 2018-02-11 DIAGNOSIS — Z96611 Presence of right artificial shoulder joint: Secondary | ICD-10-CM | POA: Diagnosis not present

## 2018-02-15 DIAGNOSIS — M25611 Stiffness of right shoulder, not elsewhere classified: Secondary | ICD-10-CM | POA: Diagnosis not present

## 2018-02-15 DIAGNOSIS — Z96611 Presence of right artificial shoulder joint: Secondary | ICD-10-CM | POA: Diagnosis not present

## 2018-02-17 DIAGNOSIS — Z96611 Presence of right artificial shoulder joint: Secondary | ICD-10-CM | POA: Diagnosis not present

## 2018-02-17 DIAGNOSIS — M25611 Stiffness of right shoulder, not elsewhere classified: Secondary | ICD-10-CM | POA: Diagnosis not present

## 2018-02-22 DIAGNOSIS — M25611 Stiffness of right shoulder, not elsewhere classified: Secondary | ICD-10-CM | POA: Diagnosis not present

## 2018-02-22 DIAGNOSIS — Z96611 Presence of right artificial shoulder joint: Secondary | ICD-10-CM | POA: Diagnosis not present

## 2018-02-25 DIAGNOSIS — Z96611 Presence of right artificial shoulder joint: Secondary | ICD-10-CM | POA: Diagnosis not present

## 2018-02-25 DIAGNOSIS — M25611 Stiffness of right shoulder, not elsewhere classified: Secondary | ICD-10-CM | POA: Diagnosis not present

## 2018-03-01 DIAGNOSIS — Z96611 Presence of right artificial shoulder joint: Secondary | ICD-10-CM | POA: Diagnosis not present

## 2018-03-01 DIAGNOSIS — M25611 Stiffness of right shoulder, not elsewhere classified: Secondary | ICD-10-CM | POA: Diagnosis not present

## 2018-03-04 DIAGNOSIS — M25611 Stiffness of right shoulder, not elsewhere classified: Secondary | ICD-10-CM | POA: Diagnosis not present

## 2018-03-04 DIAGNOSIS — Z96611 Presence of right artificial shoulder joint: Secondary | ICD-10-CM | POA: Diagnosis not present

## 2018-03-08 DIAGNOSIS — M25611 Stiffness of right shoulder, not elsewhere classified: Secondary | ICD-10-CM | POA: Diagnosis not present

## 2018-03-08 DIAGNOSIS — Z96611 Presence of right artificial shoulder joint: Secondary | ICD-10-CM | POA: Diagnosis not present

## 2018-03-11 DIAGNOSIS — M25611 Stiffness of right shoulder, not elsewhere classified: Secondary | ICD-10-CM | POA: Diagnosis not present

## 2018-03-11 DIAGNOSIS — Z96611 Presence of right artificial shoulder joint: Secondary | ICD-10-CM | POA: Diagnosis not present

## 2018-03-22 DIAGNOSIS — M25611 Stiffness of right shoulder, not elsewhere classified: Secondary | ICD-10-CM | POA: Diagnosis not present

## 2018-03-22 DIAGNOSIS — Z96611 Presence of right artificial shoulder joint: Secondary | ICD-10-CM | POA: Diagnosis not present

## 2018-03-25 DIAGNOSIS — Z96611 Presence of right artificial shoulder joint: Secondary | ICD-10-CM | POA: Diagnosis not present

## 2018-03-25 DIAGNOSIS — M25611 Stiffness of right shoulder, not elsewhere classified: Secondary | ICD-10-CM | POA: Diagnosis not present

## 2018-03-29 DIAGNOSIS — Z96611 Presence of right artificial shoulder joint: Secondary | ICD-10-CM | POA: Diagnosis not present

## 2018-03-29 DIAGNOSIS — M25611 Stiffness of right shoulder, not elsewhere classified: Secondary | ICD-10-CM | POA: Diagnosis not present

## 2018-04-02 DIAGNOSIS — Z96611 Presence of right artificial shoulder joint: Secondary | ICD-10-CM | POA: Diagnosis not present

## 2018-04-02 DIAGNOSIS — M25611 Stiffness of right shoulder, not elsewhere classified: Secondary | ICD-10-CM | POA: Diagnosis not present

## 2018-04-05 DIAGNOSIS — M25611 Stiffness of right shoulder, not elsewhere classified: Secondary | ICD-10-CM | POA: Diagnosis not present

## 2018-04-05 DIAGNOSIS — Z96611 Presence of right artificial shoulder joint: Secondary | ICD-10-CM | POA: Diagnosis not present

## 2018-04-12 DIAGNOSIS — M25611 Stiffness of right shoulder, not elsewhere classified: Secondary | ICD-10-CM | POA: Diagnosis not present

## 2018-04-12 DIAGNOSIS — Z96611 Presence of right artificial shoulder joint: Secondary | ICD-10-CM | POA: Diagnosis not present

## 2018-04-15 DIAGNOSIS — Z96611 Presence of right artificial shoulder joint: Secondary | ICD-10-CM | POA: Diagnosis not present

## 2018-04-15 DIAGNOSIS — M25611 Stiffness of right shoulder, not elsewhere classified: Secondary | ICD-10-CM | POA: Diagnosis not present

## 2018-04-19 DIAGNOSIS — Z96611 Presence of right artificial shoulder joint: Secondary | ICD-10-CM | POA: Diagnosis not present

## 2018-04-19 DIAGNOSIS — M25611 Stiffness of right shoulder, not elsewhere classified: Secondary | ICD-10-CM | POA: Diagnosis not present

## 2018-04-22 DIAGNOSIS — Z96611 Presence of right artificial shoulder joint: Secondary | ICD-10-CM | POA: Diagnosis not present

## 2018-04-22 DIAGNOSIS — M25611 Stiffness of right shoulder, not elsewhere classified: Secondary | ICD-10-CM | POA: Diagnosis not present

## 2018-07-02 DIAGNOSIS — M25611 Stiffness of right shoulder, not elsewhere classified: Secondary | ICD-10-CM | POA: Diagnosis not present

## 2018-08-05 ENCOUNTER — Other Ambulatory Visit: Payer: Self-pay | Admitting: *Deleted

## 2018-08-05 MED ORDER — SIMVASTATIN 20 MG PO TABS: ORAL_TABLET | ORAL | 3 refills | Status: DC

## 2018-08-09 DIAGNOSIS — C44329 Squamous cell carcinoma of skin of other parts of face: Secondary | ICD-10-CM | POA: Diagnosis not present

## 2018-08-24 DIAGNOSIS — G4733 Obstructive sleep apnea (adult) (pediatric): Secondary | ICD-10-CM | POA: Diagnosis not present

## 2018-08-25 DIAGNOSIS — E782 Mixed hyperlipidemia: Secondary | ICD-10-CM | POA: Diagnosis not present

## 2018-08-25 DIAGNOSIS — I10 Essential (primary) hypertension: Secondary | ICD-10-CM | POA: Diagnosis not present

## 2018-09-06 DIAGNOSIS — Z0000 Encounter for general adult medical examination without abnormal findings: Secondary | ICD-10-CM | POA: Diagnosis not present

## 2018-09-06 DIAGNOSIS — I129 Hypertensive chronic kidney disease with stage 1 through stage 4 chronic kidney disease, or unspecified chronic kidney disease: Secondary | ICD-10-CM | POA: Diagnosis not present

## 2018-09-06 DIAGNOSIS — M19049 Primary osteoarthritis, unspecified hand: Secondary | ICD-10-CM | POA: Diagnosis not present

## 2018-09-06 DIAGNOSIS — E785 Hyperlipidemia, unspecified: Secondary | ICD-10-CM | POA: Diagnosis not present

## 2018-09-06 DIAGNOSIS — R000 Tachycardia, unspecified: Secondary | ICD-10-CM | POA: Diagnosis not present

## 2018-09-10 ENCOUNTER — Ambulatory Visit: Payer: Medicare Other | Admitting: Cardiovascular Disease

## 2018-12-29 DIAGNOSIS — M19011 Primary osteoarthritis, right shoulder: Secondary | ICD-10-CM | POA: Diagnosis not present

## 2018-12-29 DIAGNOSIS — M19012 Primary osteoarthritis, left shoulder: Secondary | ICD-10-CM | POA: Diagnosis not present

## 2018-12-29 DIAGNOSIS — Z96611 Presence of right artificial shoulder joint: Secondary | ICD-10-CM | POA: Diagnosis not present

## 2019-01-10 ENCOUNTER — Other Ambulatory Visit: Payer: Self-pay

## 2019-01-10 ENCOUNTER — Ambulatory Visit (INDEPENDENT_AMBULATORY_CARE_PROVIDER_SITE_OTHER): Payer: Medicare Other | Admitting: Cardiovascular Disease

## 2019-01-10 ENCOUNTER — Encounter: Payer: Self-pay | Admitting: Cardiovascular Disease

## 2019-01-10 VITALS — BP 125/83 | HR 67 | Temp 97.3°F | Ht 73.0 in | Wt 260.0 lb

## 2019-01-10 DIAGNOSIS — E785 Hyperlipidemia, unspecified: Secondary | ICD-10-CM | POA: Diagnosis not present

## 2019-01-10 DIAGNOSIS — I25118 Atherosclerotic heart disease of native coronary artery with other forms of angina pectoris: Secondary | ICD-10-CM | POA: Diagnosis not present

## 2019-01-10 DIAGNOSIS — I10 Essential (primary) hypertension: Secondary | ICD-10-CM

## 2019-01-10 DIAGNOSIS — Z720 Tobacco use: Secondary | ICD-10-CM | POA: Diagnosis not present

## 2019-01-10 DIAGNOSIS — Z955 Presence of coronary angioplasty implant and graft: Secondary | ICD-10-CM

## 2019-01-10 NOTE — Patient Instructions (Signed)
Medication Instructions: Your physician recommends that you continue on your current medications as directed. Please refer to the Current Medication list given to you today.   Labwork: None  Procedures/Testing: None  Follow-Up:  1 year with Dr. Koneswaran  Any Additional Special Instructions Will Be Listed Below (If Applicable).     If you need a refill on your cardiac medications before your next appointment, please call your pharmacy.      Thank you for choosing [facility] !        

## 2019-01-10 NOTE — Progress Notes (Signed)
SUBJECTIVE:  The patient presents for routine follow-up for coronary artery disease.  He underwent a low low risk nuclear stress test on 02/23/17. He had a low risk Duke treadmill score of 7. There was no evidence of myocardial ischemia or scar.  Coronary angiography on 09/04/2009 demonstrated a proximal 20% LAD stenosis and a proximal left circumflex 20% stenosis. The RCA was normal. He underwent PCI at that time for a mid LAD 90% stenosis. He also has a history of hypertension and hyperlipidemia. He has sleep apnea and uses CPAP.  Pulmonary function testing in November 2018 demonstrated no evidence of COPD but possibly some mild pulmonary hypertension. I checked a CBC which was normal.  I obtained an echocardiogram on 03/23/2017 which demonstrated normal left ventricular systolic function and regional wall motion, LVEF 60 to 65%, mild focal basal septal hypertrophy, and grade 1 diastolic dysfunction.  There was no evidence of pulmonary hypertension.  The patient denies any symptoms of chest pain, palpitations, shortness of breath, lightheadedness, dizziness, leg swelling, orthopnea, PND, and syncope.   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   Review of Systems: As per "subjective", otherwise negative.  No Known Allergies  Current Outpatient Medications  Medication Sig Dispense Refill  . acetaminophen (TYLENOL) 500 MG tablet Take 1,000 mg by mouth See admin instructions. Take 1000 mg by mouth in the morning at 0400 and take 1000 mg by mouth in the evening at 1600    . aspirin 81 MG tablet Take 81 mg by mouth See admin instructions. Take 81 mg by mouth in the morning at 0400    . docusate sodium (COLACE) 100 MG capsule Take 1 capsule (100 mg total) by mouth 3 (three) times daily as needed. 20 capsule 0  . metoprolol succinate (TOPROL-XL) 25 MG 24 hr tablet Take 1 tablet (25 mg total) by mouth daily. 90 tablet 3  . nitroGLYCERIN  (NITROSTAT) 0.4 MG SL tablet Place 1 tablet (0.4 mg total) under the tongue every 5 (five) minutes as needed for chest pain. 25 tablet 3  . simvastatin (ZOCOR) 20 MG tablet Take 20 mg by mouth in the morning at 0400 90 tablet 3  . Turmeric Curcumin 500 MG CAPS Take 500 mg by mouth daily.     No current facility-administered medications for this visit.     Past Medical History:  Diagnosis Date  . Arthritis   . Asthma   . CAD (coronary artery disease)    Stent in 09/05/2009  . Cancer ([place])    skin cancer on hand, cancer removed  . Chest pain 07/2004   2D Echo EF>55%  . COPD (chronic obstructive pulmonary disease) ([place])   . COPD, mild ([place])    pt states he had study done and he does not have this anymore  . Hyperlipemia   . Hypertension 10/2004   stress test EF 57%  . Obesity   . Palpitation   . Sleep apnea    CPAP 4.5 pressure setting    Past Surgical History:  Procedure Laterality Date  . COLONOSCOPY  10/2004  . COLONOSCOPY N/A 03/29/2013   Procedure: COLONOSCOPY;  Surgeon: [name], MD;  Location: [facility];  Service: Gastroenterology;  Laterality: N/A;  . NASAL SINUS SURGERY    . SKIN CANCER EXCISION     right hand   . STENTS  09/05/2009   3.0x33mm promus drug eluting stent for a 90% mid LAD  artery stenosis  . TOTAL SHOULDER ARTHROPLASTY Right 12/24/2017  . TOTAL SHOULDER ARTHROPLASTY Right 12/24/2017   Procedure: RIGHT TOTAL SHOULDER ARTHROPLASTY;  Surgeon: Tania Ade, MD;  Location: [facility];  Service: Orthopedics;  Laterality: Right;    Social History   Socioeconomic History  . Marital status: Married    Spouse name: Not on file  . Number of children: Not on file  . Years of education: 23  . Highest education level: Not on file  Occupational History  . Not on file  Social Needs  . Financial resource strain: Not on file  . Food insecurity    Worry: Not on file    Inability: Not on file  . Transportation needs    Medical: Not on file     Non-medical: Not on file  Tobacco Use  . Smoking status: Former Smoker    Quit date: 05/12/1981    Years since quitting: 37.6  . Smokeless tobacco: Never Used  Substance and Sexual Activity  . Alcohol use: Yes    Alcohol/week: 12.0 standard drinks    Types: 12 Cans of beer per week  . Drug use: No  . Sexual activity: Yes  Lifestyle  . Physical activity    Days per week: Not on file    Minutes per session: Not on file  . Stress: Not on file  Relationships  . Social Herbalist on phone: Not on file    Gets together: Not on file    Attends religious service: Not on file    Active member of club or organization: Not on file    Attends meetings of clubs or organizations: Not on file    Relationship status: Not on file  . Intimate partner violence    Fear of current or ex partner: Not on file    Emotionally abused: Not on file    Physically abused: Not on file    Forced sexual activity: Not on file  Other Topics Concern  . Not on file  Social History Narrative  . Not on file     Vitals:   01/10/19 1604  BP: 125/83  Pulse: 67  Temp: (!) 97.3 F (36.3 C)  SpO2: 92%  Weight: 260 lb (117.9 kg)  Height: 6\' 1"  (1.854 m)    Wt Readings from Last 3 Encounters:  01/10/19 260 lb (117.9 kg)  12/24/17 245 lb (111.1 kg)  12/16/17 249 lb 1.6 oz (113 kg)     PHYSICAL EXAM General: NAD HEENT: Normal. Neck: No JVD, no thyromegaly. Lungs: Clear to auscultation bilaterally with normal respiratory effort. CV: Regular rate and rhythm, normal S1/S2, no S3/S4, no murmur. No pretibial or periankle edema.  No carotid bruit.   Abdomen: Soft, nontender, no distention.  Neurologic: Alert and oriented.  Psych: Normal affect. Skin: Normal. Musculoskeletal: No gross deformities.    ECG: Reviewed above under Subjective   Labs: Lab Results  Component Value Date/Time   K 4.5 12/25/2017 03:13 AM   BUN 15 12/25/2017 03:13 AM   CREATININE 1.19 12/25/2017 03:13 AM   ALT 26  12/16/2017 02:24 PM   HGB 12.8 (L) 12/25/2017 03:13 AM     Lipids: Lab Results  Component Value Date/Time   LDLCALC 72 02/23/2017 08:05 AM   CHOL 135 02/23/2017 08:05 AM   TRIG 97 02/23/2017 08:05 AM   HDL 44 02/23/2017 08:05 AM       ASSESSMENT AND PLAN: 1. CAD with LAD stent: Symptomatically stable.Nuclear stress test  was normal as noted above. Low risk Duke treadmill score portends a low risk of major adverse cardiac events. Continue present therapy with aspirin, metoprolol succinate, and simvastatin.  2. Essential [id number] on present therapy. No changes.  3. Hyperlipidemia: Continue simvastatin 20 mg.  4.  Tobacco use.    Disposition: Follow up 1 yr   Kate Sable, M.D., F.A.C.C.

## 2019-02-02 ENCOUNTER — Other Ambulatory Visit: Payer: Self-pay | Admitting: Cardiovascular Disease

## 2019-03-31 DIAGNOSIS — E785 Hyperlipidemia, unspecified: Secondary | ICD-10-CM | POA: Diagnosis not present

## 2019-03-31 DIAGNOSIS — G4700 Insomnia, unspecified: Secondary | ICD-10-CM | POA: Diagnosis not present

## 2019-03-31 DIAGNOSIS — I129 Hypertensive chronic kidney disease with stage 1 through stage 4 chronic kidney disease, or unspecified chronic kidney disease: Secondary | ICD-10-CM | POA: Diagnosis not present

## 2019-03-31 DIAGNOSIS — M25511 Pain in right shoulder: Secondary | ICD-10-CM | POA: Diagnosis not present

## 2019-06-08 DIAGNOSIS — G4733 Obstructive sleep apnea (adult) (pediatric): Secondary | ICD-10-CM | POA: Diagnosis not present

## 2019-07-27 DIAGNOSIS — R531 Weakness: Secondary | ICD-10-CM | POA: Diagnosis not present

## 2019-07-27 DIAGNOSIS — R2689 Other abnormalities of gait and mobility: Secondary | ICD-10-CM | POA: Diagnosis not present

## 2019-07-27 DIAGNOSIS — H9201 Otalgia, right ear: Secondary | ICD-10-CM | POA: Diagnosis not present

## 2019-07-27 DIAGNOSIS — Z955 Presence of coronary angioplasty implant and graft: Secondary | ICD-10-CM | POA: Diagnosis not present

## 2019-07-27 DIAGNOSIS — R519 Headache, unspecified: Secondary | ICD-10-CM | POA: Diagnosis not present

## 2019-07-27 DIAGNOSIS — H9209 Otalgia, unspecified ear: Secondary | ICD-10-CM | POA: Diagnosis not present

## 2019-07-27 DIAGNOSIS — I2510 Atherosclerotic heart disease of native coronary artery without angina pectoris: Secondary | ICD-10-CM | POA: Diagnosis not present

## 2019-07-27 DIAGNOSIS — R42 Dizziness and giddiness: Secondary | ICD-10-CM | POA: Diagnosis not present

## 2019-07-27 DIAGNOSIS — R030 Elevated blood-pressure reading, without diagnosis of hypertension: Secondary | ICD-10-CM | POA: Diagnosis not present

## 2019-07-27 DIAGNOSIS — Z79899 Other long term (current) drug therapy: Secondary | ICD-10-CM | POA: Diagnosis not present

## 2019-08-04 ENCOUNTER — Other Ambulatory Visit: Payer: Self-pay | Admitting: Cardiovascular Disease

## 2019-09-01 DIAGNOSIS — E785 Hyperlipidemia, unspecified: Secondary | ICD-10-CM | POA: Diagnosis not present

## 2019-09-01 DIAGNOSIS — I10 Essential (primary) hypertension: Secondary | ICD-10-CM | POA: Diagnosis not present

## 2019-09-01 DIAGNOSIS — E782 Mixed hyperlipidemia: Secondary | ICD-10-CM | POA: Diagnosis not present

## 2019-09-01 DIAGNOSIS — G4700 Insomnia, unspecified: Secondary | ICD-10-CM | POA: Diagnosis not present

## 2019-09-14 DIAGNOSIS — Z0000 Encounter for general adult medical examination without abnormal findings: Secondary | ICD-10-CM | POA: Diagnosis not present

## 2019-09-14 DIAGNOSIS — M19049 Primary osteoarthritis, unspecified hand: Secondary | ICD-10-CM | POA: Diagnosis not present

## 2019-09-14 DIAGNOSIS — E785 Hyperlipidemia, unspecified: Secondary | ICD-10-CM | POA: Diagnosis not present

## 2019-09-14 DIAGNOSIS — R000 Tachycardia, unspecified: Secondary | ICD-10-CM | POA: Diagnosis not present

## 2019-09-14 DIAGNOSIS — I129 Hypertensive chronic kidney disease with stage 1 through stage 4 chronic kidney disease, or unspecified chronic kidney disease: Secondary | ICD-10-CM | POA: Diagnosis not present

## 2019-09-20 ENCOUNTER — Telehealth: Payer: Self-pay

## 2019-09-20 MED ORDER — SIMVASTATIN 40 MG PO TABS: ORAL_TABLET | ORAL | 3 refills | Status: DC

## 2019-09-20 NOTE — Telephone Encounter (Signed)
Patient states Dr.Hall increased zocor to 40 mg qd

## 2019-10-05 DIAGNOSIS — M25561 Pain in right knee: Secondary | ICD-10-CM | POA: Diagnosis not present

## 2019-11-02 DIAGNOSIS — M25561 Pain in right knee: Secondary | ICD-10-CM | POA: Diagnosis not present

## 2019-11-04 ENCOUNTER — Other Ambulatory Visit: Payer: Self-pay | Admitting: Cardiovascular Disease

## 2019-11-15 DIAGNOSIS — M25561 Pain in right knee: Secondary | ICD-10-CM | POA: Diagnosis not present

## 2019-11-18 ENCOUNTER — Ambulatory Visit: Payer: Medicare Other | Admitting: Cardiovascular Disease

## 2019-11-21 DIAGNOSIS — X32XXXD Exposure to sunlight, subsequent encounter: Secondary | ICD-10-CM | POA: Diagnosis not present

## 2019-11-21 DIAGNOSIS — D0439 Carcinoma in situ of skin of other parts of face: Secondary | ICD-10-CM | POA: Diagnosis not present

## 2019-11-21 DIAGNOSIS — D235 Other benign neoplasm of skin of trunk: Secondary | ICD-10-CM | POA: Diagnosis not present

## 2019-11-21 DIAGNOSIS — Z1283 Encounter for screening for malignant neoplasm of skin: Secondary | ICD-10-CM | POA: Diagnosis not present

## 2019-11-21 DIAGNOSIS — D2271 Melanocytic nevi of right lower limb, including hip: Secondary | ICD-10-CM | POA: Diagnosis not present

## 2019-11-21 DIAGNOSIS — L570 Actinic keratosis: Secondary | ICD-10-CM | POA: Diagnosis not present

## 2019-11-21 DIAGNOSIS — D485 Neoplasm of uncertain behavior of skin: Secondary | ICD-10-CM | POA: Diagnosis not present

## 2019-11-21 DIAGNOSIS — D225 Melanocytic nevi of trunk: Secondary | ICD-10-CM | POA: Diagnosis not present

## 2019-12-03 DIAGNOSIS — L988 Other specified disorders of the skin and subcutaneous tissue: Secondary | ICD-10-CM | POA: Diagnosis not present

## 2019-12-05 DIAGNOSIS — X32XXXD Exposure to sunlight, subsequent encounter: Secondary | ICD-10-CM | POA: Diagnosis not present

## 2019-12-05 DIAGNOSIS — L570 Actinic keratosis: Secondary | ICD-10-CM | POA: Diagnosis not present

## 2019-12-07 DIAGNOSIS — G4733 Obstructive sleep apnea (adult) (pediatric): Secondary | ICD-10-CM | POA: Diagnosis not present

## 2019-12-08 DIAGNOSIS — M25561 Pain in right knee: Secondary | ICD-10-CM | POA: Diagnosis not present

## 2019-12-09 ENCOUNTER — Telehealth: Payer: Self-pay | Admitting: *Deleted

## 2019-12-09 ENCOUNTER — Ambulatory Visit (INDEPENDENT_AMBULATORY_CARE_PROVIDER_SITE_OTHER): Payer: Medicare Other | Admitting: Student

## 2019-12-09 ENCOUNTER — Encounter: Payer: Self-pay | Admitting: Student

## 2019-12-09 ENCOUNTER — Other Ambulatory Visit: Payer: Self-pay

## 2019-12-09 VITALS — BP 128/78 | HR 72 | Ht 73.0 in | Wt 264.2 lb

## 2019-12-09 DIAGNOSIS — E785 Hyperlipidemia, unspecified: Secondary | ICD-10-CM | POA: Diagnosis not present

## 2019-12-09 DIAGNOSIS — Z01810 Encounter for preprocedural cardiovascular examination: Secondary | ICD-10-CM

## 2019-12-09 DIAGNOSIS — I2510 Atherosclerotic heart disease of native coronary artery without angina pectoris: Secondary | ICD-10-CM | POA: Diagnosis not present

## 2019-12-09 DIAGNOSIS — G4733 Obstructive sleep apnea (adult) (pediatric): Secondary | ICD-10-CM | POA: Diagnosis not present

## 2019-12-09 MED ORDER — SIMVASTATIN 40 MG PO TABS: ORAL_TABLET | ORAL | 3 refills | Status: DC

## 2019-12-09 MED ORDER — NITROGLYCERIN 0.4 MG SL SUBL: 0.4000 mg | SUBLINGUAL_TABLET | SUBLINGUAL | 3 refills | Status: DC | PRN

## 2019-12-09 NOTE — Progress Notes (Signed)
Cardiology Office Note    Date:  12/10/2019   ID:  MARCY BOGOSIAN, DOB [birth year], MRN [id number]  PCP:  [name], MD  Cardiologist: Kate Sable, MD (Inactive)    Chief Complaint  Patient presents with  . Follow-up    1 year visit    History of Present Illness:    Omar Ruiz is a 69 y.o. male with past medical history of CAD (s/p DES to LAD in 2011, low-risk NST in 02/2017), HTN, HLD and OSA who presents to the office today for annual follow-up.  He was last examined by Dr. Bronson Ing in 12/2018 and denied any recent chest pain or dyspnea at that time. He was continued on his current cardiac medications including ASA 81 mg daily, Toprol-XL 25 mg daily and Simvastatin 20 mg daily.  In talking with the patient today, he reports overall doing well from a cardiac perspective since his last visit.  He does not exercise regularly but reports staying busy with his work and walking the dog at times. He denies any chest pain or dyspnea on exertion with these activities. No recent orthopnea, PND, lower extremity edema or palpitations.  He does report his PCP titrated Simvastatin 40 mg daily following recent labs in 08/2019 which showed his LDL was elevated at 92. He is scheduled for repeat labs next month.   He is being followed by Dr. Wynelle Link with [facility] in regards to right knee pain with plans for meniscus repair at the end of August. 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   Past Medical History:  Diagnosis Date  . Arthritis   . Asthma   . CAD (coronary artery disease)    a. s/p DES to LAD in 2011 with low-risk NST in 02/2017  . Cancer ([place])    skin cancer on hand, cancer removed  . Chest pain 07/2004   2D Echo EF>55%  . COPD (chronic obstructive pulmonary disease) ([place])   . COPD, mild ([place])    pt states he had study done and he does not have this anymore  . Hyperlipemia   . Hypertension 10/2004     stress test EF 57%  . Obesity   . Palpitation   . Sleep apnea    CPAP 4.5 pressure setting    Past Surgical History:  Procedure Laterality Date  . COLONOSCOPY  10/2004  . COLONOSCOPY N/A 03/29/2013   Procedure: COLONOSCOPY;  Surgeon: [name], MD;  [facility];  Service: Gastroenterology;  Laterality: N/A;  . NASAL SINUS SURGERY    . SKIN CANCER EXCISION     right hand   . STENTS  09/05/2009   3.0x36mm promus drug eluting stent for a 90% mid LAD artery stenosis  . TOTAL SHOULDER ARTHROPLASTY Right 12/24/2017  . TOTAL SHOULDER ARTHROPLASTY Right 12/24/2017   Procedure: RIGHT TOTAL SHOULDER ARTHROPLASTY;  Surgeon: Tania Ade, MD;  Location: [facility];  Service: Orthopedics;  Laterality: Right;    Current Medications: Outpatient Medications Prior to Visit  Medication Sig Dispense Refill  . acetaminophen (TYLENOL) 500 MG tablet Take 1,000 mg by mouth See admin instructions. Take 1000 mg by mouth in the morning at 0400 and take 1000 mg by mouth in the evening at 1600    . aspirin 81 MG tablet Take 81 mg by mouth See admin instructions. Take 81 mg by mouth in the morning at 0400    .  docusate sodium (COLACE) 100 MG capsule Take 1 capsule (100 mg total) by mouth 3 (three) times daily as needed. 20 capsule 0  . meloxicam (MOBIC) 15 MG tablet Take 15 mg by mouth daily.     . metoprolol succinate (TOPROL-XL) 25 MG 24 hr tablet Take 1 tablet by mouth once daily 90 tablet 3  . Turmeric Curcumin 500 MG CAPS Take 500 mg by mouth daily.    . nitroGLYCERIN (NITROSTAT) 0.4 MG SL tablet Place 1 tablet (0.4 mg total) under the tongue every 5 (five) minutes as needed for chest pain. 25 tablet 3  . simvastatin (ZOCOR) 40 MG tablet Take 20 mg by mouth in the morning at 0400 90 tablet 3   No facility-administered medications prior to visit.     Allergies:   Patient has no known allergies.   Social History   Socioeconomic History  . Marital status: Married    Spouse name: Not  on file  . Number of children: Not on file  . Years of education: 30  . Highest education level: Not on file  Occupational History  . Not on file  Tobacco Use  . Smoking status: Former Smoker    Quit date: 05/12/1981    Years since quitting: 38.6  . Smokeless tobacco: Never Used  Vaping Use  . Vaping Use: Never used  Substance and Sexual Activity  . Alcohol use: Yes    Alcohol/week: 12.0 standard drinks    Types: 12 Cans of beer per week  . Drug use: No  . Sexual activity: Yes  Other Topics Concern  . Not on file  Social History Narrative  . Not on file   Social Determinants of Health   Financial Resource Strain:   . Difficulty of Paying Living Expenses:   Food Insecurity:   . Worried About Charity fundraiser in the Last Year:   . Arboriculturist in the Last Year:   Transportation Needs:   . Film/video editor (Medical):   Marland Kitchen Lack of Transportation (Non-Medical):   Physical Activity:   . Days of Exercise per Week:   . Minutes of Exercise per Session:   Stress:   . Feeling of Stress :   Social Connections:   . Frequency of Communication with Friends and Family:   . Frequency of Social Gatherings with Friends and Family:   . Attends Religious Services:   . Active Member of Clubs or Organizations:   . Attends Archivist Meetings:   Marland Kitchen Marital Status:      Family History:  The patient's family history includes Asthma in an other family member; CAD in his brother; Diabetes in his mother and another family member; Heart disease in his father; Lung disease in an other family member.   Review of Systems:   Please see the history of present illness.     General:  No chills, fever, night sweats or weight changes. Positive for knee pain.  Cardiovascular:  No chest pain, dyspnea on exertion, edema, orthopnea, palpitations, paroxysmal nocturnal dyspnea. Dermatological: No rash, lesions/masses Respiratory: No cough, dyspnea Urologic: No hematuria,  dysuria Abdominal:   No nausea, vomiting, diarrhea, bright red blood per rectum, melena, or hematemesis Neurologic:  No visual changes, wkns, changes in mental status. All other systems reviewed and are otherwise negative except as noted above.   Physical Exam:    VS:  BP 128/78   Pulse 72   Ht 6\' 1"  (1.854 m)   Wt Marland Kitchen)  264 lb 3.2 oz (119.8 kg)   SpO2 92%   BMI 34.86 kg/m    General: Well developed, well nourished,male appearing in no acute distress. Head: Normocephalic, atraumatic, sclera non-icteric.  Neck: No carotid bruits. JVD not elevated.  Lungs: Respirations regular and unlabored, without wheezes or rales.  Heart: Regular rate and rhythm. No S3 or S4.  No murmur, no rubs, or gallops appreciated. Abdomen: Soft, non-tender, non-distended. No obvious abdominal masses. Msk:  Strength and tone appear normal for age. No obvious joint deformities or effusions. Extremities: No clubbing or cyanosis. No lower extremity edema.  Distal pedal pulses are 2+ bilaterally. Neuro: Alert and oriented X 3. Moves all extremities spontaneously. No focal deficits noted. Psych:  Responds to questions appropriately with a normal affect. Skin: No rashes or lesions noted  Wt Readings from Last 3 Encounters:  12/09/19 (!) 264 lb 3.2 oz (119.8 kg)  01/10/19 260 lb (117.9 kg)  12/24/17 245 lb (111.1 kg)     Studies/Labs Reviewed:   EKG:  EKG is ordered today.  The ekg ordered today demonstrates NSR, HR 72 with no acute ST abnormalities.   Recent Labs: No results found for requested labs within last 8760 hours.   Lipid Panel    Component Value Date/Time   CHOL 135 02/23/2017 0805   TRIG 97 02/23/2017 0805   HDL 44 02/23/2017 0805   CHOLHDL 3.1 02/23/2017 0805   VLDL 19 02/23/2017 0805   LDLCALC 72 02/23/2017 0805    Additional studies/ records that were reviewed today include:   NST: 02/2017  No diagnostic ST segment changes to indicate ischemia. No arrhythmias noted. Low risk Duke  treadmill score of 7.  No significant myocardial perfusion defects to indicate scar or ischemia.  This is a low risk study.  Nuclear stress EF: 63%.   Echocardiogram: 03/2017 Study Conclusions   - Left ventricle: The cavity size was normal. Systolic function was  normal. The estimated ejection fraction was in the range of 60%  to 65%. Wall motion was normal; there were no regional wall  motion abnormalities. Doppler parameters are consistent with  abnormal left ventricular relaxation (grade 1 diastolic  dysfunction). Mild focal basal septal hypertrophy.   Assessment:    1. Coronary artery disease involving native coronary artery of native heart without angina pectoris   2. Hyperlipidemia LDL goal <70   3. OSA (obstructive sleep apnea)   4. Preoperative cardiovascular examination      Plan:   In order of problems listed above:  1. CAD - He is s/p DES to LAD in 2011 with most recent ischemic evaluation being a low-risk NST in 02/2017. He denies any recent anginal symptoms. 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 He wishes to try a treadmill test initially as he did reach his target HR in the past with this. He is scheduled for knee surgery at the end of August and requests to have his test prior to this given his DOT timeline. If unable to achieve target HR, could do a [employer] following surgery.  - Continue current medication regimen with ASA 81mg  daily, Toprol-XL 25mg  daily and Simvastatin 40mg  daily. Updated Rx for SL NTG sent to his pharmacy.   2. HLD - LDL was elevated to 92 in 08/2019 and Simvastatin was titrated to 40mg  daily by his PCP with plans for repeat labs next month. If not at goal of less than 70, would consider switching to Crestor 20mg  daily.  3. OSA - Continued compliance with CPAP encouraged.   4. Preoperative Cardiac Clearance for Knee Surgery - He is scheduled for meniscus repair next month with Dr. Wynelle Link. He is  able to perform 4 METS of activity without anginal symptoms and his RCRI Risk is overall low at 0.9% risk of a major cardiac event. No indication for further cardiac testing prior to surgery. He does need a stress test for his DOT physical as outlined above but the timing of this should not impact his surgery. Can hold ASA for 5 days prior to surgery if needed given no recent intervention. Will forward today's note to [facility].    Medication Adjustments/Labs and Tests Ordered: Current medicines are reviewed at length with the patient today.  Concerns regarding medicines are outlined above.  Medication changes, Labs and Tests ordered today are listed in the Patient Instructions below. Patient Instructions  Medication Instructions:  Your physician recommends that you continue on your current medications as directed. Please refer to the Current Medication list given to you today.  *If you need a refill on your cardiac medications before your next appointment, please call your pharmacy*   Lab Work: NONE   If you have labs (blood work) drawn today and your tests are completely normal, you will receive your results only by: Marland Kitchen MyChart Message (if you have MyChart) OR . A paper copy in the mail If you have any lab test that is abnormal or we need to change your treatment, we will call you to review the results.   Testing/Procedures: Your physician has requested that you have an exercise tolerance test. For further information please visit [email or web address]. Please also follow instruction sheet, as given.  Covid screening to be done before stress test   Hold Toprol XL the morning of Stress Test   Follow-Up: At [facility], you and your health needs are our priority.  As part of our continuing mission to provide you with exceptional heart care, we have created designated Provider Care Teams.  These Care Teams include your primary Cardiologist (physician) and Advanced Practice Providers  (APPs -  Physician Assistants and Nurse Practitioners) who all work together to provide you with the care you need, when you need it.  We recommend signing up for the patient portal called "MyChart".  Sign up information is provided on this After Visit Summary.  MyChart is used to connect with patients for Virtual Visits (Telemedicine).  Patients are able to view lab/test results, encounter notes, upcoming appointments, etc.  Non-urgent messages can be sent to your provider as well.   To learn more about what you can do with MyChart, go to [email or web address].    Your next appointment:   1 year(s)  The format for your next appointment:   In Person  Provider:   Rozann Lesches, MD or Bernerd Pho, PA-C   Other Instructions Thank you for choosing [facility]!       Signed, Erma Heritage, PA-C  12/10/2019 8:08 AM    [facility] [address] [place], [place] [address] Phone: [phone] Fax: [phone]

## 2019-12-09 NOTE — Patient Instructions (Addendum)
Medication Instructions:  Your physician recommends that you continue on your current medications as directed. Please refer to the Current Medication list given to you today.  *If you need a refill on your cardiac medications before your next appointment, please call your pharmacy*   Lab Work: NONE   If you have labs (blood work) drawn today and your tests are completely normal, you will receive your results only by: Marland Kitchen MyChart Message (if you have MyChart) OR . A paper copy in the mail If you have any lab test that is abnormal or we need to change your treatment, we will call you to review the results.   Testing/Procedures: Your physician has requested that you have an exercise tolerance test. For further information please visit [email or web address]. Please also follow instruction sheet, as given.  Covid screening to be done before stress test   Hold Toprol XL the morning of Stress Test   Follow-Up: At [facility], you and your health needs are our priority.  As part of our continuing mission to provide you with exceptional heart care, we have created designated Provider Care Teams.  These Care Teams include your primary Cardiologist (physician) and Advanced Practice Providers (APPs -  Physician Assistants and Nurse Practitioners) who all work together to provide you with the care you need, when you need it.  We recommend signing up for the patient portal called "MyChart".  Sign up information is provided on this After Visit Summary.  MyChart is used to connect with patients for Virtual Visits (Telemedicine).  Patients are able to view lab/test results, encounter notes, upcoming appointments, etc.  Non-urgent messages can be sent to your provider as well.   To learn more about what you can do with MyChart, go to [email or web address].    Your next appointment:   1 year(s)  The format for your next appointment:   In Person  Provider:   Rozann Lesches, MD or Bernerd Pho,  PA-C   Other Instructions Thank you for choosing [facility]!

## 2019-12-09 NOTE — Telephone Encounter (Signed)
° °  [facility] Pre-operative Risk Assessment    HEARTCARE STAFF: - Please ensure there is not already an duplicate clearance open for this procedure. - Under Visit Info/Reason for Call, type in Other and utilize the format Clearance MM/DD/YY or Clearance TBD. Do not use dashes or single digits. - If request is for dental extraction, please clarify the # of teeth to be extracted.  Request for surgical clearance:  1. What type of surgery is being performed? Right knee scope  2. When is this surgery scheduled? 01/10/20  3. What type of clearance is required (medical clearance vs. Pharmacy clearance to hold med vs. Both)? both  4. Are there any medications that need to be held prior to surgery and how long?aspirin -need direction  5. Practice name and name of physician performing surgery? Dr frank aluisio  6. What is the office phone number? [phone]   7.   What is the office fax number? [phone]  8.   Anesthesia type (None, local, MAC, general) ? choice   Omar Ruiz 12/09/2019, 5:24 PM  _________________________________________________________________   (provider comments below)

## 2019-12-10 ENCOUNTER — Encounter: Payer: Self-pay | Admitting: Student

## 2019-12-12 NOTE — Telephone Encounter (Signed)
   Primary Cardiologist: Kate Sable, MD (Inactive)  Chart reviewed as part of pre-operative protocol coverage. Given past medical history and time since last visit, based on ACC/AHA guidelines, Omar Ruiz would be at acceptable risk for the planned procedure without further cardiovascular testing.   He was seen in clinic 12/09/19 with exercise tolerance of >4 METS and RCRI risk low at 0.9%. Per office documentation may hold ASA for 5 days prior to surgery if needed given no recent intervention.  I will route this recommendation to the requesting party via Epic fax function and remove from pre-op pool.  Please call with questions.  Loel Dubonnet, NP 12/12/2019, 8:55 AM

## 2019-12-19 ENCOUNTER — Other Ambulatory Visit (HOSPITAL_COMMUNITY)
Admission: RE | Admit: 2019-12-19 | Discharge: 2019-12-19 | Disposition: A | Discharge status: Home / Self Care | Payer: Medicare Other | Source: Ambulatory Visit | Attending: Student | Admitting: Student

## 2019-12-19 ENCOUNTER — Other Ambulatory Visit: Payer: Self-pay

## 2019-12-19 DIAGNOSIS — Z01812 Encounter for preprocedural laboratory examination: Secondary | ICD-10-CM | POA: Diagnosis not present

## 2019-12-19 DIAGNOSIS — Z20822 Contact with and (suspected) exposure to covid-19: Secondary | ICD-10-CM | POA: Diagnosis not present

## 2019-12-20 LAB — SARS CORONAVIRUS 2 (TAT 6-24 HRS): SARS Coronavirus 2: NEGATIVE

## 2019-12-22 ENCOUNTER — Other Ambulatory Visit: Payer: Self-pay

## 2019-12-22 ENCOUNTER — Ambulatory Visit (HOSPITAL_COMMUNITY)
Admission: RE | Admit: 2019-12-22 | Discharge: 2019-12-22 | Disposition: A | Discharge status: Home / Self Care | Payer: Medicare Other | Source: Ambulatory Visit | Attending: Internal Medicine | Admitting: Internal Medicine

## 2019-12-22 DIAGNOSIS — I2510 Atherosclerotic heart disease of native coronary artery without angina pectoris: Secondary | ICD-10-CM

## 2019-12-22 DIAGNOSIS — Z01810 Encounter for preprocedural cardiovascular examination: Secondary | ICD-10-CM | POA: Diagnosis not present

## 2019-12-22 LAB — EXERCISE TOLERANCE TEST
Estimated workload: 7 METS
Exercise duration (min): 5 min
Exercise duration (sec): 0 s
MPHR: 153 {beats}/min
Peak HR: 137 {beats}/min
Percent HR: 89 %
RPE: 13
Rest HR: 64 {beats}/min

## 2019-12-22 NOTE — Addendum Note (Signed)
Addended by: Barbarann Ehlers A on: 12/22/2019 11:21 AM   Modules accepted: Orders

## 2020-01-10 DIAGNOSIS — G8918 Other acute postprocedural pain: Secondary | ICD-10-CM | POA: Diagnosis not present

## 2020-01-10 DIAGNOSIS — M23321 Other meniscus derangements, posterior horn of medial meniscus, right knee: Secondary | ICD-10-CM | POA: Diagnosis not present

## 2020-01-10 DIAGNOSIS — M958 Other specified acquired deformities of musculoskeletal system: Secondary | ICD-10-CM | POA: Diagnosis not present

## 2020-01-20 DIAGNOSIS — Z471 Aftercare following joint replacement surgery: Secondary | ICD-10-CM | POA: Diagnosis not present

## 2020-01-20 DIAGNOSIS — Z96611 Presence of right artificial shoulder joint: Secondary | ICD-10-CM | POA: Diagnosis not present

## 2020-02-07 DIAGNOSIS — M25461 Effusion, right knee: Secondary | ICD-10-CM | POA: Diagnosis not present

## 2020-03-05 DIAGNOSIS — M25561 Pain in right knee: Secondary | ICD-10-CM | POA: Diagnosis not present

## 2020-03-19 DIAGNOSIS — M25511 Pain in right shoulder: Secondary | ICD-10-CM | POA: Diagnosis not present

## 2020-03-19 DIAGNOSIS — E785 Hyperlipidemia, unspecified: Secondary | ICD-10-CM | POA: Diagnosis not present

## 2020-03-19 DIAGNOSIS — Z96611 Presence of right artificial shoulder joint: Secondary | ICD-10-CM | POA: Diagnosis not present

## 2020-03-19 DIAGNOSIS — Z712 Person consulting for explanation of examination or test findings: Secondary | ICD-10-CM | POA: Diagnosis not present

## 2020-03-19 DIAGNOSIS — I2510 Atherosclerotic heart disease of native coronary artery without angina pectoris: Secondary | ICD-10-CM | POA: Diagnosis not present

## 2020-03-19 DIAGNOSIS — Z129 Encounter for screening for malignant neoplasm, site unspecified: Secondary | ICD-10-CM | POA: Diagnosis not present

## 2020-03-19 DIAGNOSIS — R000 Tachycardia, unspecified: Secondary | ICD-10-CM | POA: Diagnosis not present

## 2020-03-19 DIAGNOSIS — I129 Hypertensive chronic kidney disease with stage 1 through stage 4 chronic kidney disease, or unspecified chronic kidney disease: Secondary | ICD-10-CM | POA: Diagnosis not present

## 2020-03-20 DIAGNOSIS — M25461 Effusion, right knee: Secondary | ICD-10-CM | POA: Diagnosis not present

## 2020-03-24 DIAGNOSIS — N182 Chronic kidney disease, stage 2 (mild): Secondary | ICD-10-CM | POA: Diagnosis not present

## 2020-03-24 DIAGNOSIS — I129 Hypertensive chronic kidney disease with stage 1 through stage 4 chronic kidney disease, or unspecified chronic kidney disease: Secondary | ICD-10-CM | POA: Diagnosis not present

## 2020-04-03 DIAGNOSIS — M25561 Pain in right knee: Secondary | ICD-10-CM | POA: Diagnosis not present

## 2020-04-03 DIAGNOSIS — Z5189 Encounter for other specified aftercare: Secondary | ICD-10-CM | POA: Diagnosis not present

## 2020-04-04 DIAGNOSIS — M25561 Pain in right knee: Secondary | ICD-10-CM | POA: Diagnosis not present

## 2020-04-25 DIAGNOSIS — M1711 Unilateral primary osteoarthritis, right knee: Secondary | ICD-10-CM | POA: Diagnosis not present

## 2020-05-02 DIAGNOSIS — M1711 Unilateral primary osteoarthritis, right knee: Secondary | ICD-10-CM | POA: Diagnosis not present

## 2020-05-09 DIAGNOSIS — M1711 Unilateral primary osteoarthritis, right knee: Secondary | ICD-10-CM | POA: Diagnosis not present

## 2020-05-18 ENCOUNTER — Other Ambulatory Visit (HOSPITAL_COMMUNITY): Payer: Self-pay

## 2020-05-18 DIAGNOSIS — U071 COVID-19: Secondary | ICD-10-CM | POA: Diagnosis not present

## 2020-05-18 DIAGNOSIS — J029 Acute pharyngitis, unspecified: Secondary | ICD-10-CM | POA: Diagnosis not present

## 2020-05-18 DIAGNOSIS — R059 Cough, unspecified: Secondary | ICD-10-CM | POA: Diagnosis not present

## 2020-05-20 ENCOUNTER — Telehealth: Payer: Self-pay | Admitting: Family

## 2020-05-20 NOTE — Telephone Encounter (Signed)
Called to discuss with patient about COVID-19 symptoms and the use of one of the available treatments for those with mild to moderate Covid symptoms and at a high risk of hospitalization.  Pt appears to qualify for outpatient treatment due to co-morbid conditions and/or a member of an at-risk group in accordance with the FDA Emergency Use Authorization.    Symptom onset: 05/19/19 Current Sx: Headache, sore throat, body aches, low grade fever, cough Vaccinated: [employer]? Qualifiers: Obesity, sleep apnea, CAD, COPD  I spoke with Omar Ruiz regarding treatment with monoclonal antibodies and treatments for COVID and he has declined at this time. Hotline number is provided for further questions or if he changes his mind.   Terri Piedra, NP 05/20/2020 10:52 AM

## 2020-06-13 DIAGNOSIS — G4733 Obstructive sleep apnea (adult) (pediatric): Secondary | ICD-10-CM | POA: Diagnosis not present

## 2020-06-14 DIAGNOSIS — G4733 Obstructive sleep apnea (adult) (pediatric): Secondary | ICD-10-CM | POA: Diagnosis not present

## 2020-06-21 DIAGNOSIS — M1711 Unilateral primary osteoarthritis, right knee: Secondary | ICD-10-CM | POA: Diagnosis not present

## 2020-06-26 ENCOUNTER — Telehealth: Payer: Self-pay | Admitting: Student

## 2020-06-26 NOTE — Telephone Encounter (Signed)
   [facility] Pre-operative Risk Assessment    HEARTCARE STAFF: - Please ensure there is not already an duplicate clearance open for this procedure. - Under Visit Info/Reason for Call, type in Other and utilize the format Clearance MM/DD/YY or Clearance TBD. Do not use dashes or single digits. - If request is for dental extraction, please clarify the # of teeth to be extracted.  Request for surgical clearance:  1. What type of surgery is being performed? Right total knee arthoplasty  2. When is this surgery scheduled? 08/27/20  3. What type of clearance is required (medical clearance vs. Pharmacy clearance to hold med vs. Both)? Please advise both  4. Are there any medications that need to be held prior to surgery and how long? Please advise   5. Practice name and name of physician performing surgery? Dr Pilar Plate Alusio  6. What is the office phone number? [phone]   7.   What is the office fax number? [phone]  8.   Anesthesia type (None, local, MAC, general) ? choice   Jannet Askew 06/26/2020, 4:40 PM  _________________________________________________________________   (provider comments below)

## 2020-06-27 NOTE — Telephone Encounter (Signed)
Will send message to [facility] scheduler to schedule pre op appt.

## 2020-06-27 NOTE — Telephone Encounter (Signed)
   Primary Cardiologist: Kate Sable, MD (Inactive)  Chart reviewed as part of pre-operative protocol coverage.  Last OV 12/09/19. Date of surgery 08/27/20.  Because of Omar Ruiz's past medical history and time since last visit, he will require a follow-up visit in order to better assess preoperative cardiovascular risk.  Pre-op covering staff: - Please schedule appointment and call patient to inform them. If patient already had an upcoming appointment within acceptable timeframe, please add "pre-op clearance" to the appointment notes so provider is aware. - Please contact requesting surgeon's office via preferred method (i.e, phone, fax) to inform them of need for appointment prior to surgery.  Richardson Dopp, PA-C  06/27/2020, 1:13 PM

## 2020-06-29 NOTE — Telephone Encounter (Signed)
Message has been sent to [facility] scheduling to help with pre op appt

## 2020-07-02 NOTE — Telephone Encounter (Signed)
   Primary Cardiologist: Kate Sable, MD (Inactive)  Chart reviewed as part of pre-operative protocol coverage. Because of Omar Ruiz's past medical history and time since last visit, he will require a follow-up visit in order to better assess preoperative cardiovascular risk.  This has been scheduled for 07/06/20 with Bernerd Pho, [place]. Will route her note as an FYI. Will send to requesting party to make them aware of need for appointment. Will remove from preop pool.  Loel Dubonnet, NP  07/02/2020, 3:04 PM

## 2020-07-03 NOTE — Telephone Encounter (Signed)
Pt has appt 07/06/20 with Bernerd Pho, [place]. I will forward pre op notes to PA for upcoming appt. Will send FYI to requesting office pt has appt.

## 2020-07-06 ENCOUNTER — Encounter: Payer: Self-pay | Admitting: *Deleted

## 2020-07-06 ENCOUNTER — Ambulatory Visit (INDEPENDENT_AMBULATORY_CARE_PROVIDER_SITE_OTHER): Payer: Medicare Other | Admitting: Student

## 2020-07-06 ENCOUNTER — Encounter: Payer: Self-pay | Admitting: Student

## 2020-07-06 ENCOUNTER — Other Ambulatory Visit: Payer: Self-pay

## 2020-07-06 VITALS — BP 138/82 | HR 67 | Ht 73.0 in | Wt 258.6 lb

## 2020-07-06 DIAGNOSIS — I10 Essential (primary) hypertension: Secondary | ICD-10-CM | POA: Diagnosis not present

## 2020-07-06 DIAGNOSIS — E785 Hyperlipidemia, unspecified: Secondary | ICD-10-CM

## 2020-07-06 DIAGNOSIS — I2510 Atherosclerotic heart disease of native coronary artery without angina pectoris: Secondary | ICD-10-CM

## 2020-07-06 DIAGNOSIS — Z01810 Encounter for preprocedural cardiovascular examination: Secondary | ICD-10-CM | POA: Diagnosis not present

## 2020-07-06 DIAGNOSIS — G4733 Obstructive sleep apnea (adult) (pediatric): Secondary | ICD-10-CM | POA: Diagnosis not present

## 2020-07-06 NOTE — Patient Instructions (Signed)
Medication Instructions:  Your physician recommends that you continue on your current medications as directed. Please refer to the Current Medication list given to you today.  *If you need a refill on your cardiac medications before your next appointment, please call your pharmacy*   Lab Work: NONE   If you have labs (blood work) drawn today and your tests are completely normal, you will receive your results only by: . MyChart Message (if you have MyChart) OR . A paper copy in the mail If you have any lab test that is abnormal or we need to change your treatment, we will call you to review the results.   Testing/Procedures: NONE    Follow-Up: At [facility], you and your health needs are our priority.  As part of our continuing mission to provide you with exceptional heart care, we have created designated Provider Care Teams.  These Care Teams include your primary Cardiologist (physician) and Advanced Practice Providers (APPs -  Physician Assistants and Nurse Practitioners) who all work together to provide you with the care you need, when you need it.  We recommend signing up for the patient portal called "MyChart".  Sign up information is provided on this After Visit Summary.  MyChart is used to connect with patients for Virtual Visits (Telemedicine).  Patients are able to view lab/test results, encounter notes, upcoming appointments, etc.  Non-urgent messages can be sent to your provider as well.   To learn more about what you can do with MyChart, go to [email or web address].    Your next appointment:   1 year(s)  The format for your next appointment:   In Person  Provider:   Brittany Strader, PA-C   Other Instructions Thank you for choosing [facility]!    

## 2020-07-06 NOTE — Progress Notes (Signed)
Cardiology Office Note    Date:  07/06/2020   ID:  Omar Ruiz, DOB 05/09/1951, MRN [id number]  PCP:  Benita Stabile, MD  Cardiologist: Prentice Docker, MD (Inactive) --> Needs to switch to new MD  Chief Complaint  Patient presents with  . Follow-up    Preoperative Cardiac Clearance    History of Present Illness:    Omar Ruiz is a 70 y.o. male with past medical history of CAD (s/p DES to LAD in 2011, low-risk NST in 02/2017), HTN, HLD and OSA who presents to the office today for preoperative cardiac clearance.  He was last examined by myself in 11/2019 and denied any recent chest pain or dyspnea on exertion at that time. He was planning to undergo right knee meniscus repair and was cleared for his upcoming surgery. 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  The office was contacted earlier this month for clearance in regards to right total knee arthroplasty and follow-up was arranged given the timeframe since his last visit.  In talking with the patient today, he reports overall doing well since his last office visit. His activity has been limited secondary to right knee pain but he remains active around the house and when working on his truck. He denies any recent chest pain or dyspnea on exertion. No recent palpitations, orthopnea, PND or lower extremity edema. He does experience isolated swelling along his right knee and has had fluid removed multiple times over the past few months.  He is planning to retire in 08/2020 prior to his knee surgery.    Past Medical History:  Diagnosis Date  . Arthritis   . Asthma   . CAD (coronary artery disease)    a. s/p DES to LAD in 2011 with low-risk NST in 02/2017  . Cancer (HCC)    skin cancer on hand, cancer removed  . Chest pain 07/2004   2D Echo EF>55%  . COPD (chronic obstructive pulmonary disease) (HCC)   . COPD, mild (HCC)    pt states he had study done and he does not  have this anymore  . Hyperlipemia   . Hypertension 10/2004   stress test EF 57%  . Obesity   . Palpitation   . Sleep apnea    CPAP 4.5 pressure setting    Past Surgical History:  Procedure Laterality Date  . COLONOSCOPY  10/2004  . COLONOSCOPY N/A 03/29/2013   Procedure: COLONOSCOPY;  Surgeon: Dalia Heading, MD;  Location: [facility];  Service: Gastroenterology;  Laterality: N/A;  . NASAL SINUS SURGERY    . SKIN CANCER EXCISION     right hand   . STENTS  09/05/2009   3.0x60mm promus drug eluting stent for a 90% mid LAD artery stenosis  . TOTAL SHOULDER ARTHROPLASTY Right 12/24/2017  . TOTAL SHOULDER ARTHROPLASTY Right 12/24/2017   Procedure: RIGHT TOTAL SHOULDER ARTHROPLASTY;  Surgeon: Jones Broom, MD;  Location: [facility] OR;  Service: Orthopedics;  Laterality: Right;    Current Medications: Outpatient Medications Prior to Visit  Medication Sig Dispense Refill  . acetaminophen (TYLENOL) 500 MG tablet Take 1,000 mg by mouth See admin instructions. Take 1000 mg by mouth in the morning at 0400 and take 1000 mg by mouth in the evening at 1600    . amoxicillin (AMOXIL) 500 MG capsule     . aspirin 81 MG tablet Take 81 mg by mouth See admin instructions. Take 81  mg by mouth in the morning at 0400    . docusate sodium (COLACE) 100 MG capsule Take 1 capsule (100 mg total) by mouth 3 (three) times daily as needed. 20 capsule 0  . meloxicam (MOBIC) 15 MG tablet Take 15 mg by mouth daily.     . metoprolol succinate (TOPROL-XL) 25 MG 24 hr tablet Take 1 tablet by mouth once daily 90 tablet 3  . nitroGLYCERIN (NITROSTAT) 0.4 MG SL tablet Place 1 tablet (0.4 mg total) under the tongue every 5 (five) minutes as needed for chest pain. 25 tablet 3  . simvastatin (ZOCOR) 40 MG tablet Take 40 mg by mouth in the morning at 0400 90 tablet 3  . Turmeric Curcumin 500 MG CAPS Take 500 mg by mouth daily.     No facility-administered medications prior to visit.     Allergies:   Patient has no known  allergies.   Social History   Socioeconomic History  . Marital status: Married    Spouse name: Not on file  . Number of children: Not on file  . Years of education: 48  . Highest education level: Not on file  Occupational History  . Not on file  Tobacco Use  . Smoking status: Former Smoker    Quit date: 05/12/1981    Years since quitting: 39.1  . Smokeless tobacco: Never Used  Vaping Use  . Vaping Use: Never used  Substance and Sexual Activity  . Alcohol use: Yes    Alcohol/week: 12.0 standard drinks    Types: 12 Cans of beer per week  . Drug use: No  . Sexual activity: Yes  Other Topics Concern  . Not on file  Social History Narrative  . Not on file   Social Determinants of Health   Financial Resource Strain: Not on file  Food Insecurity: Not on file  Transportation Needs: Not on file  Physical Activity: Not on file  Stress: Not on file  Social Connections: Not on file     Family History:  The patient's family history includes Asthma in Ruiz other family member; CAD in his brother; Diabetes in his mother and another family member; Heart disease in his father; Lung disease in Ruiz other family member.   Review of Systems:   Please see the history of present illness.     General:  No chills, fever, night sweats or weight changes. Positive for knee pain.  Cardiovascular:  No chest pain, dyspnea on exertion, edema, orthopnea, palpitations, paroxysmal nocturnal dyspnea. Dermatological: No rash, lesions/masses Respiratory: No cough, dyspnea Urologic: No hematuria, dysuria Abdominal:   No nausea, vomiting, diarrhea, bright red blood per rectum, melena, or hematemesis Neurologic:  No visual changes, wkns, changes in mental status. All other systems reviewed and are otherwise negative except as noted above.   Physical Exam:    VS:  BP 138/82   Pulse 67   Ht 6\' 1"  (1.854 m)   Wt 258 lb 9.6 oz (117.3 kg)   SpO2 96%   BMI 34.12 kg/m    General: Well developed, well  nourished,male appearing in no acute distress. Head: Normocephalic, atraumatic. Neck: No carotid bruits. JVD not elevated.  Lungs: Respirations regular and unlabored, without wheezes or rales.  Heart: Regular rate and rhythm. No S3 or S4.  No murmur, no rubs, or gallops appreciated. Abdomen: Appears non-distended. No obvious abdominal masses. Msk:  Strength and tone appear normal for age. No obvious joint deformities or effusions. Extremities: No clubbing or cyanosis. No  lower extremity edema.  Distal pedal pulses are 2+ bilaterally. Neuro: Alert and oriented X 3. Moves all extremities spontaneously. No focal deficits noted. Psych:  Responds to questions appropriately with a normal affect. Skin: No rashes or lesions noted  Wt Readings from Last 3 Encounters:  07/06/20 258 lb 9.6 oz (117.3 kg)  12/09/19 (!) 264 lb 3.2 oz (119.8 kg)  01/10/19 260 lb (117.9 kg)     Studies/Labs Reviewed:   EKG:  EKG is ordered today.  The ekg ordered today demonstrates NSR, HR 64 with no acute ST abnormalities when compared to prior tracings.   Recent Labs: No results found for requested labs within last 8760 hours.   Lipid Panel    Component Value Date/Time   CHOL 135 02/23/2017 0805   TRIG 97 02/23/2017 0805   HDL 44 02/23/2017 0805   CHOLHDL 3.1 02/23/2017 0805   VLDL 19 02/23/2017 0805   LDLCALC 72 02/23/2017 0805    Additional studies/ records that were reviewed today include:   NST: 02/2017  No diagnostic ST segment changes to indicate ischemia. No arrhythmias noted. Low risk Duke treadmill score of 7.  No significant myocardial perfusion defects to indicate scar or ischemia.  This is a low risk study.  Nuclear stress EF: 63%.  Echocardiogram: 03/2017 Study Conclusions   - Left ventricle: The cavity size was normal. Systolic function was  normal. The estimated ejection fraction was in the range of 60%  to 65%. Wall motion was normal; there were no regional wall  motion  abnormalities. Doppler parameters are consistent with  abnormal left ventricular relaxation (grade 1 diastolic  dysfunction). Mild focal basal septal hypertrophy.   GXT: 12/2019  Blood pressure demonstrated a hypertensive response to exercise.  Upsloping ST segment depression ST segment depression was noted during stress in the V4, V5 and V6 leads.  Negative exercise stress test for ischemia  Duke treadmill score of at lest 5 supporting low risk for major cardiac events. Did not reach true max exercise capacity, test stopped due to knee pain.    Assessment:    1. Preoperative cardiovascular examination   2. Coronary artery disease involving native coronary artery of native heart without angina pectoris   3. Essential hypertension   4. Hyperlipidemia LDL goal <70   5. OSA (obstructive sleep apnea)      Plan:   In order of problems listed above:  1. Preoperative Cardiac Clearance for Right Total Knee Arthroplasty - He denies any recent anginal symptoms and is able to perform more than 4 METS of activity without angina. His RCRI risk is overall low at 0.9% risk of a major cardiac event and given his recent NST in 12/2019, would not pursue further ischemic evaluation at this time. He is of acceptable risk to proceed from a cardiac perspective. He can hold ASA for 5 days prior to his surgery if needed. Will fax today's note to the requesting party via Epic fax function.   2. CAD - He is s/p DES to LAD in 2011 with low-risk NST in 02/2017 and he also had a GXT 12/2019 which was negative for ischemia. He denies any recent anginal symptoms. - Continue current medication regimen with ASA 81 mg daily, Toprol-XL 25 mg daily, Simvastatin 40 mg daily and PRN SL NTG.   3. HTN - BP is well controlled at 138/82 during today's visit he reports similar results when checked at home. Continue current medication regimen with Toprol-XL 25 mg daily.  4.  HLD - LDL was elevated to 92 in 16/1096  and his Simvastatin dosing was titrated. Will request most recent labs from his PCP. He remains on Simvastatin 40 mg daily.  5. OSA - Continued compliance with CPAP encouraged.    Medication Adjustments/Labs and Tests Ordered: Current medicines are reviewed at length with the patient today.  Concerns regarding medicines are outlined above.  Medication changes, Labs and Tests ordered today are listed in the Patient Instructions below. Patient Instructions  Medication Instructions:  Your physician recommends that you continue on your current medications as directed. Please refer to the Current Medication list given to you today.  *If you need a refill on your cardiac medications before your next appointment, please call your pharmacy*   Lab Work: NONE   If you have labs (blood work) drawn today and your tests are completely normal, you will receive your results only by: Marland Kitchen MyChart Message (if you have MyChart) OR . A paper copy in the mail If you have any lab test that is abnormal or we need to change your treatment, we will call you to review the results.   Testing/Procedures: NONE    Follow-Up: At [facility], you and your health needs are our priority.  As part of our continuing mission to provide you with exceptional heart care, we have created designated Provider Care Teams.  These Care Teams include your primary Cardiologist (physician) and Advanced Practice Providers (APPs -  Physician Assistants and Nurse Practitioners) who all work together to provide you with the care you need, when you need it.  We recommend signing up for the patient portal called "MyChart".  Sign up information is provided on this After Visit Summary.  MyChart is used to connect with patients for Virtual Visits (Telemedicine).  Patients are able to view lab/test results, encounter notes, upcoming appointments, etc.  Non-urgent messages can be sent to your provider as well.   To learn more about what you  can do with MyChart, go to [email or web address].    Your next appointment:   1 year(s)  The format for your next appointment:   In Person  Provider:   Randall An, PA-C   Other Instructions Thank you for choosing [facility]!       Signed, Ellsworth Lennox, PA-C  07/06/2020 1:47 PM    [facility] [address] [place], [place] [address] Phone: [phone] Fax: [phone]

## 2020-08-04 DIAGNOSIS — I129 Hypertensive chronic kidney disease with stage 1 through stage 4 chronic kidney disease, or unspecified chronic kidney disease: Secondary | ICD-10-CM | POA: Diagnosis not present

## 2020-08-04 DIAGNOSIS — G4733 Obstructive sleep apnea (adult) (pediatric): Secondary | ICD-10-CM | POA: Diagnosis not present

## 2020-08-04 DIAGNOSIS — I10 Essential (primary) hypertension: Secondary | ICD-10-CM | POA: Diagnosis not present

## 2020-08-04 DIAGNOSIS — I2510 Atherosclerotic heart disease of native coronary artery without angina pectoris: Secondary | ICD-10-CM | POA: Diagnosis not present

## 2020-08-04 DIAGNOSIS — N182 Chronic kidney disease, stage 2 (mild): Secondary | ICD-10-CM | POA: Diagnosis not present

## 2020-08-04 DIAGNOSIS — E785 Hyperlipidemia, unspecified: Secondary | ICD-10-CM | POA: Diagnosis not present

## 2020-08-04 DIAGNOSIS — R7303 Prediabetes: Secondary | ICD-10-CM | POA: Diagnosis not present

## 2020-08-14 NOTE — Patient Instructions (Addendum)
DUE TO COVID-19 ONLY ONE VISITOR IS ALLOWED TO COME WITH YOU AND STAY IN THE WAITING ROOM ONLY DURING PRE OP AND PROCEDURE DAY OF SURGERY. THE 1 VISITOR  MAY VISIT WITH YOU AFTER SURGERY IN YOUR PRIVATE ROOM DURING VISITING HOURS ONLY!  YOU NEED TO HAVE A COVID 19 TEST ON_4/14______ @_______ , THIS TEST MUST BE DONE BEFORE SURGERY,  COVID TESTING SITE [address] [place] [address], IT IS ON THE RIGHT GOING OUT [address] APPROXIMATELY  2 MINUTES PAST [employer] ON THE RIGHT. ONCE YOUR COVID TEST IS COMPLETED,  PLEASE BEGIN THE QUARANTINE INSTRUCTIONS AS OUTLINED IN YOUR HANDOUT.                Omar Ruiz   Your procedure is scheduled on: 08/27/20   Report to [facility] Main  Entrance   Report to admitting at  9:05 AM     Call this number if you have problems the morning of surgery [place], NO CHEWING GUM [place].   No food after midnight.     You may have clear liquid until 8:30 AM.     At 8:00 AM drink pre surgery drink.    Nothing by mouth after 8:30 AM.   Take these medicines the morning of surgery with A SIP OF WATER: Metoprolol, Simvastatin   Bring CPAP mask and tubing day of procedure                        You may not have any metal on your body including              piercings  Do not wear jewelry,  lotions, powders or deodorant              Men may shave face and neck.   Do not bring valuables to the hospital. [place].  Contacts, dentures or bridgework may not be worn into surgery.      [place] Special Instructions: N/A              Please read over the following fact sheets you were given: _____________________________________________________________________             [facility] - Preparing for Surgery Before surgery, you can play an important role.  Because skin is not sterile, your  skin needs to be as free of germs as possible.  You can reduce the number of germs on your skin by washing with CHG (chlorahexidine gluconate) soap before surgery.  CHG is an antiseptic cleaner which kills germs and bonds with the skin to continue killing germs even after washing. Please DO NOT use if you have an allergy to CHG or antibacterial soaps.  If your skin becomes reddened/irritated stop using the CHG and inform your nurse when you arrive at Short Stay. You may shave your face/neck.  Please follow these instructions carefully:  1.  Shower with CHG Soap the night before surgery and the  morning of Surgery.  2.  If you choose to wash your hair, wash your hair first as usual with your  normal  shampoo.  3.  After you shampoo, rinse your hair and body thoroughly to remove the  shampoo.  4.  Use CHG as you would any other liquid soap.  You can apply chg directly  to the skin and wash                       Gently with a scrungie or clean washcloth.  5.  Apply the CHG Soap to your body ONLY FROM THE NECK DOWN.   Do not use on face/ open                           Wound or open sores. Avoid contact with eyes, ears mouth and genitals (private parts).                       Wash face,  Genitals (private parts) with your normal soap.             6.  Wash thoroughly, paying special attention to the area where your surgery  will be performed.  7.  Thoroughly rinse your body with warm water from the neck down.  8.  DO NOT shower/wash with your normal soap after using and rinsing off  the CHG Soap.             9.  Pat yourself dry with a clean towel.            10.  Wear clean pajamas.            11.  Place clean sheets on your bed the night of your first shower and do not  sleep with pets. Day of Surgery : Do not apply any lotions/deodorants the morning of surgery.  Please wear clean clothes to the hospital/surgery center.  FAILURE TO FOLLOW THESE INSTRUCTIONS MAY  RESULT IN THE CANCELLATION OF YOUR SURGERY PATIENT SIGNATURE_________________________________  NURSE SIGNATURE__________________________________  ________________________________________________________________________   Omar Ruiz  An incentive spirometer is a tool that can help keep your lungs clear and active. This tool measures how well you are filling your lungs with each breath. Taking long deep breaths may help reverse or decrease the chance of developing breathing (pulmonary) problems (especially infection) following:  A long period of time when you are unable to move or be active. BEFORE THE PROCEDURE   If the spirometer includes an indicator to show your best effort, your nurse or respiratory therapist will set it to a desired goal.  If possible, sit up straight or lean slightly forward. Try not to slouch.  Hold the incentive spirometer in an upright position. INSTRUCTIONS FOR USE  1. Sit on the edge of your bed if possible, or sit up as far as you can in bed or on a chair. 2. Hold the incentive spirometer in an upright position. 3. Breathe out normally. 4. Place the mouthpiece in your mouth and seal your lips tightly around it. 5. Breathe in slowly and as deeply as possible, raising the piston or the ball toward the top of the column. 6. Hold your breath for 3-5 seconds or for as long as possible. Allow the piston or ball to fall to the bottom of the column. 7. Remove the mouthpiece from your mouth and breathe out normally. 8. Rest for a few seconds and repeat Steps 1 through 7 at least 10 times every 1-2 hours when you are awake. Take your time and take a few normal breaths between deep breaths. 9. The spirometer may include an indicator to show your best effort.  Use the indicator as a goal to work toward during each repetition. 10. After each set of 10 deep breaths, practice coughing to be sure your lungs are clear. If you have an incision (the cut made at the  time of surgery), support your incision when coughing by placing a pillow or rolled up towels firmly against it. Once you are able to get out of bed, walk around indoors and cough well. You may stop using the incentive spirometer when instructed by your caregiver.  RISKS AND COMPLICATIONS  Take your time so you do not get dizzy or light-headed.  If you are in pain, you may need to take or ask for pain medication before doing incentive spirometry. It is harder to take a deep breath if you are having pain. AFTER USE  Rest and breathe slowly and easily.  It can be helpful to keep track of a log of your progress. Your caregiver can provide you with a simple table to help with this. If you are using the spirometer at home, follow these instructions: [place] IF:   You are having difficultly using the spirometer.  You have trouble using the spirometer as often as instructed.  Your pain medication is not giving enough relief while using the spirometer.  You develop fever of 100.5 F (38.1 C) or higher. SEEK IMMEDIATE MEDICAL CARE IF:   You cough up bloody sputum that had not been present before.  You develop fever of 102 F (38.9 C) or greater.  You develop worsening pain at or near the incision site. MAKE SURE YOU:   Understand these instructions.  Will watch your condition.  Will get help right away if you are not doing well or get worse. Document Released: 09/08/2006 Document Revised: 07/21/2011 Document Reviewed: 11/09/2006 ExitCare Patient Information 2014 ExitCare, [place].   ________________________________________________________________________  WHAT IS A BLOOD TRANSFUSION? Blood Transfusion Information  A transfusion is the replacement of blood or some of its parts. Blood is made up of multiple cells which provide different functions.  Red blood cells carry oxygen and are used for blood loss replacement.  White blood cells fight against  infection.  Platelets control bleeding.  Plasma helps clot blood.  Other blood products are available for specialized needs, such as hemophilia or other clotting disorders. BEFORE THE TRANSFUSION  Who gives blood for transfusions?   Healthy volunteers who are fully evaluated to make sure their blood is safe. This is blood bank blood. Transfusion therapy is the safest it has ever been in the practice of medicine. Before blood is taken from a donor, a complete history is taken to make sure that person has no history of diseases nor engages in risky social behavior (examples are intravenous drug use or sexual activity with multiple partners). The donor's travel history is screened to minimize risk of transmitting infections, such as malaria. The donated blood is tested for signs of infectious diseases, such as HIV and hepatitis. The blood is then tested to be sure it is compatible with you in order to minimize the chance of a transfusion reaction. If you or a relative donates blood, this is often done in anticipation of surgery and is not appropriate for emergency situations. It takes many days to process the donated blood. RISKS AND COMPLICATIONS Although transfusion therapy is very safe and saves many lives, the main dangers of transfusion include:   Getting an infectious disease.  Developing a transfusion reaction. This is an allergic reaction to something in the  blood you were given. Every precaution is taken to prevent this. The decision to have a blood transfusion has been considered carefully by your caregiver before blood is given. Blood is not given unless the benefits outweigh the risks. AFTER THE TRANSFUSION  Right after receiving a blood transfusion, you will usually feel much better and more energetic. This is especially true if your red blood cells have gotten low (anemic). The transfusion raises the level of the red blood cells which carry oxygen, and this usually causes an energy  increase.  The nurse administering the transfusion will monitor you carefully for complications. HOME CARE INSTRUCTIONS  No special instructions are needed after a transfusion. You may find your energy is better. Speak with your caregiver about any limitations on activity for underlying diseases you may have. SEEK MEDICAL CARE IF:   Your condition is not improving after your transfusion.  You develop redness or irritation at the intravenous (IV) site. SEEK IMMEDIATE MEDICAL CARE IF:  Any of the following symptoms occur over the next 12 hours:  Shaking chills.  You have a temperature by mouth above 102 F (38.9 C), not controlled by medicine.  Chest, back, or muscle pain.  People around you feel you are not acting correctly or are confused.  Shortness of breath or difficulty breathing.  Dizziness and fainting.  You get a rash or develop hives.  You have a decrease in urine output.  Your urine turns a dark color or changes to pink, red, or brown. Any of the following symptoms occur over the next 10 days:  You have a temperature by mouth above 102 F (38.9 C), not controlled by medicine.  Shortness of breath.  Weakness after normal activity.  The white part of the eye turns yellow (jaundice).  You have a decrease in the amount of urine or are urinating less often.  Your urine turns a dark color or changes to pink, red, or brown. Document Released: 04/25/2000 Document Revised: 07/21/2011 Document Reviewed: 12/13/2007 [facility] Patient Information 2014 [place], [place].  _______________________________________________________________________

## 2020-08-15 ENCOUNTER — Other Ambulatory Visit: Payer: Self-pay

## 2020-08-15 ENCOUNTER — Encounter (HOSPITAL_COMMUNITY)
Admission: RE | Admit: 2020-08-15 | Discharge: 2020-08-15 | Disposition: A | Discharge status: Home / Self Care | Payer: Medicare Other | Source: Ambulatory Visit | Attending: Orthopedic Surgery | Admitting: Orthopedic Surgery

## 2020-08-15 ENCOUNTER — Encounter (HOSPITAL_COMMUNITY): Payer: Self-pay

## 2020-08-15 DIAGNOSIS — Z87891 Personal history of nicotine dependence: Secondary | ICD-10-CM | POA: Insufficient documentation

## 2020-08-15 DIAGNOSIS — Z96611 Presence of right artificial shoulder joint: Secondary | ICD-10-CM | POA: Diagnosis not present

## 2020-08-15 DIAGNOSIS — Z01812 Encounter for preprocedural laboratory examination: Secondary | ICD-10-CM | POA: Insufficient documentation

## 2020-08-15 DIAGNOSIS — I10 Essential (primary) hypertension: Secondary | ICD-10-CM | POA: Diagnosis not present

## 2020-08-15 DIAGNOSIS — Z791 Long term (current) use of non-steroidal anti-inflammatories (NSAID): Secondary | ICD-10-CM | POA: Diagnosis not present

## 2020-08-15 DIAGNOSIS — Z79899 Other long term (current) drug therapy: Secondary | ICD-10-CM | POA: Insufficient documentation

## 2020-08-15 DIAGNOSIS — Z792 Long term (current) use of antibiotics: Secondary | ICD-10-CM | POA: Insufficient documentation

## 2020-08-15 DIAGNOSIS — I2510 Atherosclerotic heart disease of native coronary artery without angina pectoris: Secondary | ICD-10-CM | POA: Diagnosis not present

## 2020-08-15 DIAGNOSIS — Z955 Presence of coronary angioplasty implant and graft: Secondary | ICD-10-CM | POA: Insufficient documentation

## 2020-08-15 DIAGNOSIS — M1711 Unilateral primary osteoarthritis, right knee: Secondary | ICD-10-CM | POA: Diagnosis not present

## 2020-08-15 DIAGNOSIS — Z7983 Long term (current) use of bisphosphonates: Secondary | ICD-10-CM | POA: Insufficient documentation

## 2020-08-15 DIAGNOSIS — J449 Chronic obstructive pulmonary disease, unspecified: Secondary | ICD-10-CM | POA: Insufficient documentation

## 2020-08-15 DIAGNOSIS — G4730 Sleep apnea, unspecified: Secondary | ICD-10-CM | POA: Insufficient documentation

## 2020-08-15 LAB — CBC
HCT: 43.6 % (ref 39.0–52.0)
Hemoglobin: 14.8 g/dL (ref 13.0–17.0)
MCH: 31.2 pg (ref 26.0–34.0)
MCHC: 33.9 g/dL (ref 30.0–36.0)
MCV: 92 fL (ref 80.0–100.0)
Platelets: 237 10*3/uL (ref 150–400)
RBC: 4.74 MIL/uL (ref 4.22–5.81)
RDW: 13.5 % (ref 11.5–15.5)
WBC: 5.7 10*3/uL (ref 4.0–10.5)
nRBC: 0 % (ref 0.0–0.2)

## 2020-08-15 LAB — COMPREHENSIVE METABOLIC PANEL
ALT: 21 U/L (ref 0–44)
AST: 20 U/L (ref 15–41)
Albumin: 4.3 g/dL (ref 3.5–5.0)
Alkaline Phosphatase: 54 U/L (ref 38–126)
Anion gap: 8 (ref 5–15)
BUN: 11 mg/dL (ref 8–23)
CO2: 26 mmol/L (ref 22–32)
Calcium: 9.2 mg/dL (ref 8.9–10.3)
Chloride: 109 mmol/L (ref 98–111)
Creatinine, Ser: 0.98 mg/dL (ref 0.61–1.24)
GFR, Estimated: >60 mL/min (ref >60)
Glucose, Bld: 92 mg/dL (ref 70–99)
Potassium: 4 mmol/L (ref 3.5–5.1)
Sodium: 143 mmol/L (ref 135–145)
Total Bilirubin: 0.7 mg/dL (ref 0.3–1.2)
Total Protein: 7.1 g/dL (ref 6.5–8.1)

## 2020-08-15 LAB — APTT: aPTT: 28 seconds (ref 24–36)

## 2020-08-15 LAB — PROTIME-INR
INR: 1 (ref 0.8–1.2)
Prothrombin Time: 13 seconds (ref 11.4–15.2)

## 2020-08-15 NOTE — Progress Notes (Addendum)
COVID Vaccine Completed: Yes Date COVID Vaccine completed:x1 Has received booster:No COVID vaccine manufacturer:   [employer]  Date of COVID positive in last 90 days: 05/18/2020 in epic  PCP - [name], MD Cardiologist - Cardiac clearance Erma Heritage, PA-C  07/06/2020   Chest x-ray - greater than 1 year in epic EKG - 07/06/20 in epic Stress Test - 12/22/2019 in epic ECHO - greater than 2 years in epic Cardiac Cath -  greater than 2 years  Pacemaker/ICD device last checked:N/A  Sleep Study - Yes  CPAP - Yes  Fasting Blood Sugar - N/A Checks Blood Sugar N/A_____ times a day  Blood Thinner Instructions: N/A Aspirin Instructions: Yes  Last Dose:August 21, 2020  Activity level:  Able to exercise without symptoms     Anesthesia review: CAD, HTN, COPD, OSA  Patient denies shortness of breath, fever, cough and chest pain at PAT appointment   Patient verbalized understanding of instructions that were given to them at the PAT appointment. Patient was also instructed that they will need to review over the PAT instructions again at home before surgery.

## 2020-08-16 LAB — SURGICAL PCR SCREEN
MRSA, PCR: POSITIVE — AB
Staphylococcus aureus: POSITIVE — AB

## 2020-08-16 NOTE — Anesthesia Preprocedure Evaluation (Addendum)
Anesthesia Evaluation  Patient identified by MRN, date of birth, ID band Patient awake    Reviewed: Allergy & Precautions, NPO status , Patient's Chart, lab work & pertinent test results, reviewed documented beta blocker date and time   History of Anesthesia Complications Negative for: history of anesthetic complications  Airway Mallampati: II  TM Distance: >3 FB Neck ROM: Full    Dental  (+) Dental Advisory Given   Pulmonary sleep apnea and Continuous Positive Airway Pressure Ventilation , COPD, former smoker,  08/23/2020 SARS coronavirus NEG   breath sounds clear to auscultation       Cardiovascular hypertension, Pt. on medications and Pt. on home beta blockers (-) angina+ CAD and + Cardiac Stents   Rhythm:Regular Rate:Normal  '18 ECHO: EF 60-65%, Grade 1 DD, no significant valvular abnormalities '21 exercise stress test negative for ischemia   Neuro/Psych negative neurological ROS     GI/Hepatic negative GI ROS, Neg liver ROS,   Endo/Other  negative endocrine ROS  Renal/GU negative Renal ROS     Musculoskeletal  (+) Arthritis , Osteoarthritis,    Abdominal   Peds  Hematology negative hematology ROS (+)   Anesthesia Other Findings   Reproductive/Obstetrics                           Anesthesia Physical Anesthesia Plan  ASA: III  Anesthesia Plan: Spinal   Post-op Pain Management:  Regional for Post-op pain   Induction:   PONV Risk Score and Plan: 1 and Ondansetron  Airway Management Planned: Simple Face Mask and Natural Airway  Additional Equipment: None  Intra-op Plan:   Post-operative Plan:   Informed Consent: I have reviewed the patients History and Physical, chart, labs and discussed the procedure including the risks, benefits and alternatives for the proposed anesthesia with the patient or authorized representative who has indicated his/her understanding and acceptance.      Dental advisory given  Plan Discussed with: CRNA and Surgeon  Anesthesia Plan Comments: (See PAT note 08/15/2020, Konrad Felix, PA-C Plan routine monitors, SAB with adductor canal block for post op analgesia)      Anesthesia Quick Evaluation

## 2020-08-16 NOTE — Progress Notes (Signed)
Anesthesia Chart Review   Case: [id number] Date/Time: 08/27/20 1120   Procedure: TOTAL KNEE ARTHROPLASTY (Right Knee) - 100min   Anesthesia type: Choice   Pre-op diagnosis: Right knee osteoarthritis   Location: [facility]OM 09 / [facility]   Surgeons: Gaynelle Arabian, MD      DISCUSSION:70 y.o. former smoker with h/o HTN, asthma, sleep apnea, COPD, CAD (DES to LAD 2011), right knee OA scheduled for above procedure 08/27/2020 with Dr. Gaynelle Arabian.   Pt last seen by cardiology 07/06/2018 for preoperative evaluation.  Per OV note, " He denies any recent anginal symptoms and is able to perform more than 4 METS of activity without angina. His RCRI risk is overall low at 0.9% risk of a major cardiac event and given his recent NST in 12/2019, would not pursue further ischemic evaluation at this time. He is of acceptable risk to proceed from a cardiac perspective. He can hold ASA for 5 days prior to his surgery if needed. Will fax today's note to the requesting party via Epic fax function."  Anticipate pt can proceed with planned procedure barring acute status change.    VS: BP 136/87   Pulse 63   Temp 36.9 C (Oral)   Resp 16   Ht 6\' 1"  (1.854 m)   Wt 117.1 kg   SpO2 97%   BMI 34.07 kg/m   PROVIDERS: [name], MD is PCP  Kate Sable, MD is Cardiologist  LABS: Labs reviewed: Acceptable for surgery. (all labs ordered are listed, but only abnormal results are displayed)  Labs Reviewed  SURGICAL PCR SCREEN - Abnormal; Notable for the following components:      Result Value   MRSA, PCR POSITIVE (*)    Staphylococcus aureus POSITIVE (*)    All other components within normal limits  CBC  COMPREHENSIVE METABOLIC PANEL  PROTIME-INR  APTT  TYPE AND SCREEN     IMAGES:   EKG: 07/06/2020 Rate 64 bpm  NSR  CV: Stress Test 12/22/2019  Blood pressure demonstrated a hypertensive response to exercise.  Upsloping ST segment depression ST segment depression was noted during stress in  the V4, V5 and V6 leads.  Negative exercise stress test for ischemia  [facility] treadmill score of at lest 5 supporting low risk for major cardiac events. Did not reach true max exercise capacity, test stopped due to knee pain.   Echo 03/23/2017 Study Conclusions   - Left ventricle: The cavity size was normal. Systolic function was  normal. The estimated ejection fraction was in the range of 60%  to 65%. Wall motion was normal; there were no regional wall  motion abnormalities. Doppler parameters are consistent with  abnormal left ventricular relaxation (grade 1 diastolic  dysfunction). Mild focal basal septal hypertrophy.  Past Medical History:  Diagnosis Date  . Arthritis   . Asthma   . CAD (coronary artery disease)    a. s/p DES to LAD in 2011 with low-risk NST in 02/2017  . Cancer ([place])    skin cancer on hand, cancer removed  . Chest pain 07/2004   2D Echo EF>55%  . COPD (chronic obstructive pulmonary disease) ([place])   . COPD, mild ([place])    pt states he had study done and he does not have this anymore  . Hyperlipemia   . Hypertension 10/2004   stress test EF 57%  . Obesity   . Palpitation   . Sleep apnea    CPAP 4.5 pressure setting    Past Surgical  History:  Procedure Laterality Date  . COLONOSCOPY  10/2004  . COLONOSCOPY N/A 03/29/2013   Procedure: COLONOSCOPY;  Surgeon: [name], MD;  [facility];  Service: Gastroenterology;  Laterality: N/A;  . MOUTH SURGERY    . NASAL SINUS SURGERY    . SKIN CANCER EXCISION     right hand   . STENTS  09/05/2009   3.0x30mm promus drug eluting stent for a 90% mid LAD artery stenosis  . TOTAL SHOULDER ARTHROPLASTY Right 12/24/2017   Procedure: RIGHT TOTAL SHOULDER ARTHROPLASTY;  Surgeon: Tania Ade, MD;  Location: [facility];  Service: Orthopedics;  Laterality: Right;    MEDICATIONS: . acetaminophen (TYLENOL) 500 MG tablet  . amoxicillin (AMOXIL) 500 MG capsule  . aspirin 81 MG tablet  .  Cholecalciferol (VITAMIN D3) 125 MCG (5000 UT) TABS  . meloxicam (MOBIC) 15 MG tablet  . metoprolol succinate (TOPROL-XL) 25 MG 24 hr tablet  . Multiple Vitamins-Minerals (MULTIVITAMIN WITH MINERALS) tablet  . nitroGLYCERIN (NITROSTAT) 0.4 MG SL tablet  . OVER THE COUNTER MEDICATION  . simvastatin (ZOCOR) 40 MG tablet  . Turmeric Curcumin 500 MG CAPS   No current facility-administered medications for this encounter.     Konrad Felix, PA-C [facility] Pre-Surgical Testing [phone]

## 2020-08-16 NOTE — Progress Notes (Signed)
PCR results sent to Dr. Aluisio to review. 

## 2020-08-23 ENCOUNTER — Other Ambulatory Visit (HOSPITAL_COMMUNITY)
Admission: RE | Admit: 2020-08-23 | Discharge: 2020-08-23 | Disposition: A | Discharge status: Home / Self Care | Payer: Medicare Other | Source: Ambulatory Visit | Attending: Orthopedic Surgery | Admitting: Orthopedic Surgery

## 2020-08-23 DIAGNOSIS — Z20822 Contact with and (suspected) exposure to covid-19: Secondary | ICD-10-CM | POA: Insufficient documentation

## 2020-08-23 DIAGNOSIS — Z01812 Encounter for preprocedural laboratory examination: Secondary | ICD-10-CM | POA: Diagnosis not present

## 2020-08-23 LAB — SARS CORONAVIRUS 2 (TAT 6-24 HRS): SARS Coronavirus 2: NEGATIVE

## 2020-08-26 MED ORDER — BUPIVACAINE LIPOSOME 1.3 % IJ SUSP
20.0000 mL | INTRAMUSCULAR | Status: DC
  Filled 2020-08-26: qty 20

## 2020-08-26 NOTE — H&P (Signed)
TOTAL KNEE ADMISSION H&P  Patient is being admitted for right total knee arthroplasty.  Subjective:  Chief Complaint: Right knee pain.  HPI: Omar Ruiz, 70 y.o. male has a history of pain and functional disability in the right knee due to arthritis and has failed non-surgical conservative treatments for greater than 12 weeks to include NSAID's and/or analgesics and activity modification. Onset of symptoms was gradual, starting several years ago with gradually worsening course since that time. The patient noted no past surgery on the right knee.  Patient currently rates pain in the right knee at 5 out of 10 with activity. Patient has worsening of pain with activity and weight bearing and pain that interferes with activities of daily living. Patient has evidence of periarticular osteophytes and joint space narrowing by imaging studies. There is no active infection.  Patient Active Problem List   Diagnosis Date Noted  . Status post total shoulder arthroplasty, right 12/24/2017  . Back pain 12/16/2012  . Contusion of back 12/16/2012  . Osteoarthritis, knee 01/22/2012  . Acute torn meniscus 01/22/2012  . Effusion of knee joint 01/22/2012  . Knee pain, right 01/22/2012  . CONTUSION, LEFT KNEE 07/02/2009    Past Medical History:  Diagnosis Date  . Arthritis   . Asthma   . CAD (coronary artery disease)    a. s/p DES to LAD in 2011 with low-risk NST in 02/2017  . Cancer ([place])    skin cancer on hand, cancer removed  . Chest pain 07/2004   2D Echo EF>55%  . COPD (chronic obstructive pulmonary disease) ([place])   . COPD, mild ([place])    pt states he had study done and he does not have this anymore  . Hyperlipemia   . Hypertension 10/2004   stress test EF 57%  . Obesity   . Palpitation   . Sleep apnea    CPAP 4.5 pressure setting    Past Surgical History:  Procedure Laterality Date  . COLONOSCOPY  10/2004  . COLONOSCOPY N/A 03/29/2013   Procedure: COLONOSCOPY;  Surgeon: [name], MD;  [facility];  Service: Gastroenterology;  Laterality: N/A;  . MOUTH SURGERY    . NASAL SINUS SURGERY    . SKIN CANCER EXCISION     right hand   . STENTS  09/05/2009   3.0x84mm promus drug eluting stent for a 90% mid LAD artery stenosis  . TOTAL SHOULDER ARTHROPLASTY Right 12/24/2017   Procedure: RIGHT TOTAL SHOULDER ARTHROPLASTY;  Surgeon: Tania Ade, MD;  Location: [facility];  Service: Orthopedics;  Laterality: Right;    Prior to Admission medications   Medication Sig Start Date End Date Taking? Authorizing Provider  acetaminophen (TYLENOL) 500 MG tablet Take 1,000 mg by mouth daily as needed for moderate pain or mild pain.   Yes [provider]  aspirin 81 MG tablet Take 81 mg by mouth See admin instructions. Take 81 mg by mouth in the morning at 0400   Yes [provider]  Cholecalciferol (VITAMIN D3) 125 MCG (5000 UT) TABS Take 5,000 Units by mouth daily.   Yes [provider]  meloxicam (MOBIC) 15 MG tablet Take 15 mg by mouth daily.    Yes [provider]  metoprolol succinate (TOPROL-XL) 25 MG 24 hr tablet Take 1 tablet by mouth once daily Patient taking differently: Take 25 mg by mouth daily. 11/04/19  Yes Herminio Commons, MD  Multiple Vitamins-Minerals (MULTIVITAMIN WITH MINERALS) tablet Take 1 tablet by mouth daily.  Yes [provider]  nitroGLYCERIN (NITROSTAT) 0.4 MG SL tablet Place 1 tablet (0.4 mg total) under the tongue every 5 (five) minutes as needed for chest pain. 12/09/19  Yes Strader, [place] M, PA-C  OVER THE COUNTER MEDICATION Take 1,000 mg by mouth daily. Beet root   Yes [provider]  simvastatin (ZOCOR) 40 MG tablet Take 40 mg by mouth in the morning at 0400 Patient taking differently: Take 40 mg by mouth daily. 0400 12/09/19  Yes Strader, [place] M, PA-C  Turmeric Curcumin 500 MG CAPS Take 500 mg by mouth daily.   Yes [provider]  amoxicillin (AMOXIL) 500 MG  capsule Take 2,000 mg by mouth once. 06/23/19   [provider]    No Known Allergies  Social History   Socioeconomic History  . Marital status: Married    Spouse name: Not on file  . Number of children: Not on file  . Years of education: 61  . Highest education level: Not on file  Occupational History  . Not on file  Tobacco Use  . Smoking status: Former Smoker    Quit date: 05/12/1981    Years since quitting: 39.3  . Smokeless tobacco: Never Used  Vaping Use  . Vaping Use: Never used  Substance and Sexual Activity  . Alcohol use: Yes    Alcohol/week: 12.0 standard drinks    Types: 12 Cans of beer per week  . Drug use: No  . Sexual activity: Yes  Other Topics Concern  . Not on file  Social History Narrative  . Not on file   Social Determinants of Health   Financial Resource Strain: Not on file  Food Insecurity: Not on file  Transportation Needs: Not on file  Physical Activity: Not on file  Stress: Not on file  Social Connections: Not on file  Intimate Partner Violence: Not on file    Tobacco Use: Medium Risk  . Smoking Tobacco Use: Former Smoker  . Smokeless Tobacco Use: Never Used   Social History   Substance and Sexual Activity  Alcohol Use Yes  . Alcohol/week: 12.0 standard drinks  . Types: 12 Cans of beer per week    Family History  Problem Relation Age of Onset  . Diabetes Mother   . Heart disease Father   . Lung disease Other   . Asthma Other   . Diabetes Other   . CAD Brother        3 brothers hx of cad    ROS: Constitutional: no fever, no chills, no night sweats, no significant weight loss Cardiovascular: no chest pain, no palpitations Respiratory: no cough, no shortness of breath, No COPD Gastrointestinal: no vomiting, no nausea Musculoskeletal: no swelling in Joints, Joint Pain Neurologic: no numbness, no tingling, no difficulty with balance   Objective:  Physical Exam: Well nourished and well developed.  General: Alert  and oriented x3, cooperative and pleasant, no acute distress.  Head: normocephalic, atraumatic, neck supple.  Eyes: EOMI.  Respiratory: breath sounds clear in all fields, no wheezing, rales, or rhonchi. Cardiovascular: Regular rate and rhythm, no murmurs, gallops or rubs.  Abdomen: non-tender to palpation and soft, normoactive bowel sounds. Musculoskeletal:    Right Knee Exam:  Large effusion present. No soft tissue swelling present.  Range of motion: 0 to 125 degrees.  Moderate crepitus on range of motion of the knee.  Slight medial joint line tenderness.  Slight lateral joint line tenderness.  The knee is stable.    The  patient's sensation and motor function are intact in their lower extremities. Their distal pulses are 2+. The bilateral calves are soft and non-tender.  Vital signs in last 24 hours:    Imaging Review AP and lateral of the right knee dated 06/21/2020 demonstrate he is now bone on bone in the medial and patellofemoral compartments, which is a tremendous progression compared to his preoperative status.   Assessment/Plan:  End stage arthritis, right knee   The patient history, physical examination, clinical judgment of the provider and imaging studies are consistent with end stage degenerative joint disease of the right knee and total knee arthroplasty is deemed medically necessary. The treatment options including medical management, injection therapy arthroscopy and arthroplasty were discussed at length. The risks and benefits of total knee arthroplasty were presented and reviewed. The risks due to aseptic loosening, infection, stiffness, patella tracking problems, thromboembolic complications and other imponderables were discussed. The patient acknowledged the explanation, agreed to proceed with the plan and consent was signed. Patient is being admitted for inpatient treatment for surgery, pain control, PT, OT, prophylactic antibiotics, VTE prophylaxis, progressive  ambulation and ADLs and discharge planning. The patient is planning to be discharged home.   Patient's anticipated LOS is less than 2 midnights, meeting these requirements: - Younger than 73 - Lives within 1 hour of care - Has a competent adult at home to recover with post-op recover - NO history of  - Chronic pain requiring opiods  - Diabetes  - Coronary Artery Disease  - Heart failure  - Heart attack  - Stroke  - DVT/VTE  - Cardiac arrhythmia  - Respiratory Failure/COPD  - Renal failure  - Anemia  - Advanced Liver disease      Therapy Plans: PT at [facility] in [place] Disposition: Home with wife Planned DVT Prophylaxis: Aspirin 325 DME Needed: Gilford Rile PCP: Wende Neighbors TXA: IV Allergies: None Anesthesia Concerns: On CPAP, has had to have Albuterol following anesthesia in the past.  BMI: 34.5 Last HgbA1c: N/A  Pharmacy: [facility] [address] in [place]  Other: Patient requests that he does not want to take his wedding ring off during the procedure. States that he has had his ring taped in the past.  - Patient was instructed on what medications to stop prior to surgery. - Follow-up visit in 2 weeks with Dr. Wynelle Link - Begin physical therapy following surgery - Pre-operative lab work as pre-surgical testing - Prescriptions will be provided in hospital at time of discharge  Fenton Foy, [facility], PA-C Orthopedic Surgery [facility] [place]

## 2020-08-27 ENCOUNTER — Encounter (HOSPITAL_COMMUNITY): Payer: Self-pay | Admitting: Orthopedic Surgery

## 2020-08-27 ENCOUNTER — Ambulatory Visit (HOSPITAL_COMMUNITY): Payer: Medicare Other | Admitting: Certified Registered Nurse Anesthetist

## 2020-08-27 ENCOUNTER — Encounter (HOSPITAL_COMMUNITY)
Admission: RE | Disposition: A | Discharge status: Home / Self Care | Payer: Self-pay | Source: Home / Self Care | Attending: Orthopedic Surgery

## 2020-08-27 ENCOUNTER — Ambulatory Visit (HOSPITAL_COMMUNITY): Payer: Medicare Other | Admitting: Physician Assistant

## 2020-08-27 ENCOUNTER — Other Ambulatory Visit: Payer: Self-pay

## 2020-08-27 ENCOUNTER — Observation Stay (HOSPITAL_COMMUNITY)
Admission: RE | Admit: 2020-08-27 | Discharge: 2020-08-28 | Disposition: A | Discharge status: Home / Self Care | Payer: Medicare Other | Attending: Orthopedic Surgery | Admitting: Orthopedic Surgery

## 2020-08-27 DIAGNOSIS — Z791 Long term (current) use of non-steroidal anti-inflammatories (NSAID): Secondary | ICD-10-CM | POA: Insufficient documentation

## 2020-08-27 DIAGNOSIS — Z79899 Other long term (current) drug therapy: Secondary | ICD-10-CM | POA: Insufficient documentation

## 2020-08-27 DIAGNOSIS — E785 Hyperlipidemia, unspecified: Secondary | ICD-10-CM | POA: Diagnosis not present

## 2020-08-27 DIAGNOSIS — I2510 Atherosclerotic heart disease of native coronary artery without angina pectoris: Secondary | ICD-10-CM | POA: Diagnosis not present

## 2020-08-27 DIAGNOSIS — J45909 Unspecified asthma, uncomplicated: Secondary | ICD-10-CM | POA: Diagnosis not present

## 2020-08-27 DIAGNOSIS — I10 Essential (primary) hypertension: Secondary | ICD-10-CM | POA: Diagnosis not present

## 2020-08-27 DIAGNOSIS — Z7951 Long term (current) use of inhaled steroids: Secondary | ICD-10-CM | POA: Insufficient documentation

## 2020-08-27 DIAGNOSIS — M1710 Unilateral primary osteoarthritis, unspecified knee: Secondary | ICD-10-CM | POA: Diagnosis present

## 2020-08-27 DIAGNOSIS — J449 Chronic obstructive pulmonary disease, unspecified: Secondary | ICD-10-CM | POA: Insufficient documentation

## 2020-08-27 DIAGNOSIS — Z87891 Personal history of nicotine dependence: Secondary | ICD-10-CM | POA: Insufficient documentation

## 2020-08-27 DIAGNOSIS — M179 Osteoarthritis of knee, unspecified: Secondary | ICD-10-CM | POA: Diagnosis present

## 2020-08-27 DIAGNOSIS — M1711 Unilateral primary osteoarthritis, right knee: Secondary | ICD-10-CM | POA: Diagnosis not present

## 2020-08-27 DIAGNOSIS — G8918 Other acute postprocedural pain: Secondary | ICD-10-CM | POA: Diagnosis not present

## 2020-08-27 DIAGNOSIS — Z7982 Long term (current) use of aspirin: Secondary | ICD-10-CM | POA: Diagnosis not present

## 2020-08-27 HISTORY — PX: TOTAL KNEE ARTHROPLASTY: SHX125

## 2020-08-27 LAB — TYPE AND SCREEN
ABO/RH(D): B NEG
Antibody Screen: NEGATIVE

## 2020-08-27 SURGERY — ARTHROPLASTY, KNEE, TOTAL
Anesthesia: Spinal | Site: Knee | Laterality: Right

## 2020-08-27 MED ORDER — EPHEDRINE SULFATE-NACL 50-0.9 MG/10ML-% IV SOSY
PREFILLED_SYRINGE | INTRAVENOUS | Status: DC | PRN
  Administered 2020-08-27 (×3): 10 mg via INTRAVENOUS

## 2020-08-27 MED ORDER — TRAMADOL HCL 50 MG PO TABS
50.0000 mg | ORAL_TABLET | Freq: Four times a day (QID) | ORAL | Status: DC | PRN
  Administered 2020-08-28: 100 mg via ORAL
  Filled 2020-08-27: qty 2

## 2020-08-27 MED ORDER — ACETAMINOPHEN 500 MG PO TABS
1000.0000 mg | ORAL_TABLET | Freq: Four times a day (QID) | ORAL | Status: AC
  Administered 2020-08-27 – 2020-08-28 (×4): 1000 mg via ORAL
  Filled 2020-08-27 (×4): qty 2

## 2020-08-27 MED ORDER — PROPOFOL 500 MG/50ML IV EMUL
INTRAVENOUS | Status: DC | PRN
  Administered 2020-08-27: 75 ug/kg/min via INTRAVENOUS

## 2020-08-27 MED ORDER — POVIDONE-IODINE 10 % EX SWAB
2.0000 "application " | Freq: Once | CUTANEOUS | Status: AC
  Administered 2020-08-27: 2 via TOPICAL

## 2020-08-27 MED ORDER — METOPROLOL SUCCINATE ER 25 MG PO TB24
25.0000 mg | ORAL_TABLET | Freq: Every day | ORAL | Status: DC
  Administered 2020-08-28: 25 mg via ORAL
  Filled 2020-08-27: qty 1

## 2020-08-27 MED ORDER — METOCLOPRAMIDE HCL 5 MG/ML IJ SOLN
5.0000 mg | Freq: Three times a day (TID) | INTRAMUSCULAR | Status: DC | PRN
Start: 2020-08-27 — End: 2020-08-28

## 2020-08-27 MED ORDER — DEXAMETHASONE SODIUM PHOSPHATE 10 MG/ML IJ SOLN
8.0000 mg | Freq: Once | INTRAMUSCULAR | Status: AC
  Administered 2020-08-27: 8 mg via INTRAVENOUS

## 2020-08-27 MED ORDER — FLEET ENEMA 7-19 GM/118ML RE ENEM: 1.0000 | ENEMA | Freq: Once | RECTAL | Status: DC | PRN

## 2020-08-27 MED ORDER — GABAPENTIN 300 MG PO CAPS
300.0000 mg | ORAL_CAPSULE | Freq: Three times a day (TID) | ORAL | Status: DC
  Administered 2020-08-27 – 2020-08-28 (×3): 300 mg via ORAL
  Filled 2020-08-27 (×3): qty 1

## 2020-08-27 MED ORDER — ONDANSETRON HCL 4 MG PO TABS: 4.0000 mg | ORAL_TABLET | Freq: Four times a day (QID) | ORAL | Status: DC | PRN

## 2020-08-27 MED ORDER — MIDAZOLAM HCL 2 MG/2ML IJ SOLN: 0.5000 mg | Freq: Once | INTRAMUSCULAR | Status: DC | PRN

## 2020-08-27 MED ORDER — PHENOL 1.4 % MT LIQD
1.0000 | OROMUCOSAL | Status: DC | PRN
Start: 2020-08-27 — End: 2020-08-28

## 2020-08-27 MED ORDER — MEPERIDINE HCL 50 MG/ML IJ SOLN: 6.2500 mg | INTRAMUSCULAR | Status: DC | PRN

## 2020-08-27 MED ORDER — METOCLOPRAMIDE HCL 5 MG PO TABS: 5.0000 mg | ORAL_TABLET | Freq: Three times a day (TID) | ORAL | Status: DC | PRN

## 2020-08-27 MED ORDER — DIPHENHYDRAMINE HCL 12.5 MG/5ML PO ELIX: 12.5000 mg | ORAL_SOLUTION | ORAL | Status: DC | PRN

## 2020-08-27 MED ORDER — MENTHOL 3 MG MT LOZG: 1.0000 | LOZENGE | OROMUCOSAL | Status: DC | PRN

## 2020-08-27 MED ORDER — CEFAZOLIN SODIUM-DEXTROSE 2-4 GM/100ML-% IV SOLN
2.0000 g | INTRAVENOUS | Status: AC
  Administered 2020-08-27: 2 g via INTRAVENOUS
  Filled 2020-08-27: qty 100

## 2020-08-27 MED ORDER — DOCUSATE SODIUM 100 MG PO CAPS
100.0000 mg | ORAL_CAPSULE | Freq: Two times a day (BID) | ORAL | Status: DC
  Administered 2020-08-27 – 2020-08-28 (×2): 100 mg via ORAL
  Filled 2020-08-27 (×2): qty 1

## 2020-08-27 MED ORDER — LACTATED RINGERS IV SOLN: INTRAVENOUS | Status: DC

## 2020-08-27 MED ORDER — METHOCARBAMOL 500 MG PO TABS
500.0000 mg | ORAL_TABLET | Freq: Four times a day (QID) | ORAL | Status: DC | PRN
  Administered 2020-08-28 (×2): 500 mg via ORAL
  Filled 2020-08-27 (×2): qty 1

## 2020-08-27 MED ORDER — ORAL CARE MOUTH RINSE
15.0000 mL | Freq: Once | OROMUCOSAL | Status: AC
  Administered 2020-08-27: 15 mL via OROMUCOSAL

## 2020-08-27 MED ORDER — PROPOFOL 10 MG/ML IV BOLUS
INTRAVENOUS | Status: DC | PRN
  Administered 2020-08-27 (×2): 30 mg via INTRAVENOUS

## 2020-08-27 MED ORDER — SODIUM CHLORIDE 0.9 % IR SOLN
Status: DC | PRN
  Administered 2020-08-27: 1000 mL

## 2020-08-27 MED ORDER — POLYETHYLENE GLYCOL 3350 17 G PO PACK: 17.0000 g | PACK | Freq: Every day | ORAL | Status: DC | PRN

## 2020-08-27 MED ORDER — HYDROMORPHONE HCL 1 MG/ML IJ SOLN: 0.2500 mg | INTRAMUSCULAR | Status: DC | PRN

## 2020-08-27 MED ORDER — TRANEXAMIC ACID-NACL 1000-0.7 MG/100ML-% IV SOLN
1000.0000 mg | INTRAVENOUS | Status: AC
  Administered 2020-08-27: 1000 mg via INTRAVENOUS
  Filled 2020-08-27: qty 100

## 2020-08-27 MED ORDER — SODIUM CHLORIDE 0.9 % IV SOLN: INTRAVENOUS | Status: DC

## 2020-08-27 MED ORDER — MIDAZOLAM HCL 2 MG/2ML IJ SOLN
1.0000 mg | INTRAMUSCULAR | Status: DC
  Administered 2020-08-27: 2 mg via INTRAVENOUS
  Filled 2020-08-27: qty 2

## 2020-08-27 MED ORDER — OXYCODONE HCL 5 MG PO TABS
5.0000 mg | ORAL_TABLET | ORAL | Status: DC | PRN
  Administered 2020-08-27 – 2020-08-28 (×3): 10 mg via ORAL
  Filled 2020-08-27 (×3): qty 2

## 2020-08-27 MED ORDER — BUPIVACAINE LIPOSOME 1.3 % IJ SUSP
INTRAMUSCULAR | Status: DC | PRN
  Administered 2020-08-27: 20 mL

## 2020-08-27 MED ORDER — ONDANSETRON HCL 4 MG/2ML IJ SOLN
INTRAMUSCULAR | Status: DC | PRN
  Administered 2020-08-27: 4 mg via INTRAVENOUS

## 2020-08-27 MED ORDER — METHOCARBAMOL 500 MG IVPB - SIMPLE MED
500.0000 mg | Freq: Four times a day (QID) | INTRAVENOUS | Status: DC | PRN
  Filled 2020-08-27 (×2): qty 50

## 2020-08-27 MED ORDER — SODIUM CHLORIDE (PF) 0.9 % IJ SOLN
INTRAMUSCULAR | Status: DC | PRN
  Administered 2020-08-27: 60 mL

## 2020-08-27 MED ORDER — OXYCODONE HCL 5 MG/5ML PO SOLN: 5.0000 mg | Freq: Once | ORAL | Status: DC | PRN

## 2020-08-27 MED ORDER — BISACODYL 10 MG RE SUPP: 10.0000 mg | Freq: Every day | RECTAL | Status: DC | PRN

## 2020-08-27 MED ORDER — NITROGLYCERIN 0.4 MG SL SUBL: 0.4000 mg | SUBLINGUAL_TABLET | SUBLINGUAL | Status: DC | PRN

## 2020-08-27 MED ORDER — SODIUM CHLORIDE (PF) 0.9 % IJ SOLN
INTRAMUSCULAR | Status: AC
  Filled 2020-08-27: qty 10

## 2020-08-27 MED ORDER — CEFAZOLIN SODIUM-DEXTROSE 2-4 GM/100ML-% IV SOLN
2.0000 g | Freq: Four times a day (QID) | INTRAVENOUS | Status: AC
  Administered 2020-08-27 – 2020-08-28 (×2): 2 g via INTRAVENOUS
  Filled 2020-08-27 (×2): qty 100

## 2020-08-27 MED ORDER — CHLORHEXIDINE GLUCONATE 0.12 % MT SOLN: 15.0000 mL | Freq: Once | OROMUCOSAL | Status: AC

## 2020-08-27 MED ORDER — FENTANYL CITRATE (PF) 100 MCG/2ML IJ SOLN
50.0000 ug | INTRAMUSCULAR | Status: DC
Start: 2020-08-27 — End: 2020-08-27
  Administered 2020-08-27: 100 ug via INTRAVENOUS
  Filled 2020-08-27: qty 2

## 2020-08-27 MED ORDER — RIVAROXABAN 10 MG PO TABS
10.0000 mg | ORAL_TABLET | Freq: Every day | ORAL | Status: DC
  Administered 2020-08-28: 10 mg via ORAL
  Filled 2020-08-27: qty 1

## 2020-08-27 MED ORDER — SIMVASTATIN 40 MG PO TABS
40.0000 mg | ORAL_TABLET | Freq: Every day | ORAL | Status: DC
  Administered 2020-08-28: 40 mg via ORAL
  Filled 2020-08-27: qty 1

## 2020-08-27 MED ORDER — ACETAMINOPHEN 10 MG/ML IV SOLN
1000.0000 mg | Freq: Four times a day (QID) | INTRAVENOUS | Status: DC
  Administered 2020-08-27: 1000 mg via INTRAVENOUS
  Filled 2020-08-27: qty 100

## 2020-08-27 MED ORDER — ROPIVACAINE HCL 7.5 MG/ML IJ SOLN
INTRAMUSCULAR | Status: DC | PRN
  Administered 2020-08-27: 20 mL via PERINEURAL

## 2020-08-27 MED ORDER — DEXAMETHASONE SODIUM PHOSPHATE 10 MG/ML IJ SOLN
10.0000 mg | Freq: Once | INTRAMUSCULAR | Status: DC
  Filled 2020-08-27: qty 1

## 2020-08-27 MED ORDER — MORPHINE SULFATE (PF) 2 MG/ML IV SOLN: 0.5000 mg | INTRAVENOUS | Status: DC | PRN

## 2020-08-27 MED ORDER — PROMETHAZINE HCL 25 MG/ML IJ SOLN: 6.2500 mg | INTRAMUSCULAR | Status: DC | PRN

## 2020-08-27 MED ORDER — OXYCODONE HCL 5 MG PO TABS: 5.0000 mg | ORAL_TABLET | Freq: Once | ORAL | Status: DC | PRN

## 2020-08-27 MED ORDER — ONDANSETRON HCL 4 MG/2ML IJ SOLN: 4.0000 mg | Freq: Four times a day (QID) | INTRAMUSCULAR | Status: DC | PRN

## 2020-08-27 MED ORDER — STERILE WATER FOR IRRIGATION IR SOLN
Status: DC | PRN
  Administered 2020-08-27: 2000 mL

## 2020-08-27 MED ORDER — BUPIVACAINE IN DEXTROSE 0.75-8.25 % IT SOLN
INTRATHECAL | Status: DC | PRN
  Administered 2020-08-27: 15 mL via INTRATHECAL

## 2020-08-27 SURGICAL SUPPLY — 52 items
ATTUNE MED DOME PAT 41 KNEE (Knees) ×1 IMPLANT
ATTUNE PS FEM RT SZ 8 CEM KNEE (Femur) ×1 IMPLANT
ATTUNE PSRP INSR SZ8 10 KNEE (Insert) ×1 IMPLANT
BAG SPEC THK2 15X12 ZIP CLS (MISCELLANEOUS)
BAG ZIPLOCK 12X15 (MISCELLANEOUS) ×1 IMPLANT
BASE TIBIAL ROT PLAT SZ 8 KNEE (Knees) IMPLANT
BLADE SAG 18X100X1.27 (BLADE) ×2 IMPLANT
BLADE SAW SGTL 11.0X1.19X90.0M (BLADE) ×2 IMPLANT
BNDG ELASTIC 6X5.8 VLCR STR LF (GAUZE/BANDAGES/DRESSINGS) ×2 IMPLANT
BOWL SMART MIX CTS (DISPOSABLE) ×2 IMPLANT
BSPLAT TIB 8 CMNT ROT PLAT STR (Knees) ×1 IMPLANT
CEMENT HV SMART SET (Cement) ×4 IMPLANT
COVER SURGICAL LIGHT HANDLE (MISCELLANEOUS) ×2 IMPLANT
COVER WAND RF STERILE (DRAPES) ×1 IMPLANT
CUFF TOURN SGL QUICK 34 (TOURNIQUET CUFF) ×2
CUFF TRNQT CYL 34X4.125X (TOURNIQUET CUFF) ×1 IMPLANT
DECANTER SPIKE VIAL GLASS SM (MISCELLANEOUS) ×2 IMPLANT
DRAPE U-SHAPE 47X51 STRL (DRAPES) ×2 IMPLANT
DRSG AQUACEL AG ADV 3.5X10 (GAUZE/BANDAGES/DRESSINGS) ×2 IMPLANT
DURAPREP 26ML APPLICATOR (WOUND CARE) ×2 IMPLANT
ELECT REM PT RETURN 15FT ADLT (MISCELLANEOUS) ×2 IMPLANT
GLOVE SRG 8 PF TXTR STRL LF DI (GLOVE) ×1 IMPLANT
GLOVE SURG ENC MOIS LTX SZ8 (GLOVE) ×4 IMPLANT
GLOVE SURG UNDER POLY LF SZ8 (GLOVE) ×2
GLOVE SURG UNDER POLY LF SZ8.5 (GLOVE) ×1 IMPLANT
GOWN STRL REUS W/TWL LRG LVL3 (GOWN DISPOSABLE) ×4 IMPLANT
GOWN STRL REUS W/TWL XL LVL3 (GOWN DISPOSABLE) ×2 IMPLANT
HANDPIECE INTERPULSE COAX TIP (DISPOSABLE) ×2
HOLDER FOLEY CATH W/STRAP (MISCELLANEOUS) ×1 IMPLANT
IMMOBILIZER KNEE 20 (SOFTGOODS) ×2
IMMOBILIZER KNEE 20 THIGH 36 (SOFTGOODS) ×1 IMPLANT
KIT TURNOVER KIT A (KITS) ×2 IMPLANT
MANIFOLD NEPTUNE II (INSTRUMENTS) ×2 IMPLANT
NS IRRIG 1000ML POUR BTL (IV SOLUTION) ×2 IMPLANT
PACK TOTAL KNEE CUSTOM (KITS) ×2 IMPLANT
PADDING CAST COTTON 6X4 STRL (CAST SUPPLIES) ×3 IMPLANT
PENCIL SMOKE EVACUATOR (MISCELLANEOUS) ×2 IMPLANT
PIN DRILL FIX HALF THREAD (BIT) ×1 IMPLANT
PIN STEINMAN FIXATION KNEE (PIN) ×1 IMPLANT
PROTECTOR NERVE ULNAR (MISCELLANEOUS) ×2 IMPLANT
SET HNDPC FAN SPRY TIP SCT (DISPOSABLE) ×1 IMPLANT
STRIP CLOSURE SKIN 1/2X4 (GAUZE/BANDAGES/DRESSINGS) ×4 IMPLANT
SUT MNCRL AB 4-0 PS2 18 (SUTURE) ×2 IMPLANT
SUT STRATAFIX 0 PDS 27 VIOLET (SUTURE) ×2
SUT VIC AB 2-0 CT1 27 (SUTURE) ×6
SUT VIC AB 2-0 CT1 TAPERPNT 27 (SUTURE) ×3 IMPLANT
SUTURE STRATFX 0 PDS 27 VIOLET (SUTURE) ×1 IMPLANT
TIBIAL BASE ROT PLAT SZ 8 KNEE (Knees) ×2 IMPLANT
TRAY FOLEY MTR SLVR 16FR STAT (SET/KITS/TRAYS/PACK) ×2 IMPLANT
TUBE SUCTION HIGH CAP CLEAR NV (SUCTIONS) ×1 IMPLANT
WATER STERILE IRR 1000ML POUR (IV SOLUTION) ×4 IMPLANT
WRAP KNEE MAXI GEL POST OP (GAUZE/BANDAGES/DRESSINGS) ×2 IMPLANT

## 2020-08-27 NOTE — Care Plan (Signed)
Ortho Bundle Case Management Note  Patient Details  Name: AMMAR MOFFATT MRN: [id number] Date of Birth: [birth year]                  R TKA on 08-27-20 DCP: Home with wife. 1 story home with 3 steps to enter. DME: RW ordered through [facility]. Has a 3-in-1. PT: [facility] 4/21   DME Arranged:  Gilford Rile rolling DME Agency:  [facility]  HH Arranged:    [place] Agency:     Additional Comments: Please contact me with any questions of if this plan should need to change.  Marianne Sofia, RN,CCM [facility]  [phone] 08/27/2020, 2:33 PM

## 2020-08-27 NOTE — Interval H&P Note (Signed)
History and Physical Interval Note:  08/27/2020 9:40 AM  Omar Ruiz  has presented today for surgery, with the diagnosis of Right knee osteoarthritis.  The various methods of treatment have been discussed with the patient and family. After consideration of risks, benefits and other options for treatment, the patient has consented to  Procedure(s) with comments: TOTAL KNEE ARTHROPLASTY (Right) - 74min as a surgical intervention.  The patient's history has been reviewed, patient examined, no change in status, stable for surgery.  I have reviewed the patient's chart and labs.  Questions were answered to the patient's satisfaction.     Pilar Plate Tellis Spivak

## 2020-08-27 NOTE — Transfer of Care (Signed)
Immediate Anesthesia Transfer of Care Note  Patient: Omar Ruiz  Procedure(s) Performed: TOTAL KNEE ARTHROPLASTY (Right Knee)  Patient Location: PACU  Anesthesia Type:Spinal and MAC combined with regional for post-op pain  Level of Consciousness: awake, alert  and patient cooperative  Airway & Oxygen Therapy: Patient Spontanous Breathing and Patient connected to face mask oxygen  Post-op Assessment: Report given to RN and Post -op Vital signs reviewed and stable  Post vital signs: Reviewed and stable  Last Vitals:  Vitals Value Taken Time  BP 113/73 08/27/20 1330  Temp    Pulse 70 08/27/20 1331  Resp 13 08/27/20 1331  SpO2 98 % 08/27/20 1331  Vitals shown include unvalidated device data.  Last Pain:  Vitals:   08/27/20 1130  PainSc: 0-No pain      Patients Stated Pain Goal: 3 (94/50/38 8828)  Complications: No complications documented.

## 2020-08-27 NOTE — Anesthesia Postprocedure Evaluation (Signed)
Anesthesia Post Note  Patient: [name]  Procedure(s) Performed: TOTAL KNEE ARTHROPLASTY (Right Knee)     Patient location during evaluation: PACU Anesthesia Type: Spinal Level of consciousness: awake and alert, patient cooperative and oriented Pain management: pain level controlled Vital Signs Assessment: post-procedure vital signs reviewed and stable Respiratory status: spontaneous breathing, nonlabored ventilation and respiratory function stable Cardiovascular status: blood pressure returned to baseline and stable Postop Assessment: no apparent nausea or vomiting, spinal receding and patient able to bend at knees Anesthetic complications: no   No complications documented.  Last Vitals:  Vitals:   08/27/20 1445 08/27/20 1507  BP: (!) 144/82 139/80  Pulse: (!) 57 60  Resp: 12 14  Temp: 36.4 C (!) 36.4 C  SpO2: 96% 100%    Last Pain:  Vitals:   08/27/20 1507  TempSrc: Oral  PainSc:                  Siraj Dermody,E. Terri Malerba

## 2020-08-27 NOTE — Progress Notes (Signed)
AssistedDr. Carswell Jackson with right, ultrasound guided, adductor canal block. Side rails up, monitors on throughout procedure. See vital signs in flow sheet. Tolerated Procedure well.  

## 2020-08-27 NOTE — Progress Notes (Signed)
Orthopedic Tech Progress Note Patient Details:  Omar Ruiz 1950/06/22 [id number]  CPM Right Knee CPM Right Knee: On Right Knee Flexion (Degrees): 40 Right Knee Extension (Degrees): 10  Post Interventions Patient Tolerated: Well Instructions Provided: Care of device  [name] 08/27/2020, 1:39 PM

## 2020-08-27 NOTE — Anesthesia Procedure Notes (Signed)
Spinal  Patient location during procedure: OR End time: 08/27/2020 11:52 AM Staffing Performed: anesthesiologist and resident/CRNA  Anesthesiologist: Annye Asa, MD Resident/CRNA: West Pugh, CRNA Preanesthetic Checklist Completed: patient identified, IV checked, site marked, risks and benefits discussed, surgical consent, monitors and equipment checked, pre-op evaluation and timeout performed Spinal Block Patient position: sitting Prep: DuraPrep and site prepped and draped Patient monitoring: blood pressure, continuous pulse ox, cardiac monitor and heart rate Approach: midline Location: L3-4 Injection technique: single-shot Needle Needle type: Pencan and Introducer  Needle gauge: 24 G Needle length: 9 cm Assessment Events: CSF return and second provider Additional Notes Pt identified in Operating room.  Monitors applied. Working IV access confirmed. Sterile prep, drape lumbar spine.  1% lido local L 2,3 by CRNA, all attempts os.  Repeat local L 3,4 and #24ga Pencan into clear CSF L 3,4.  15mg  0.75% Bupivacaine with dextrose injected with asp CSF beginning and end of injection.  Patient asymptomatic, VSS, no heme aspirated, tolerated well.  Jenita Seashore, MD

## 2020-08-27 NOTE — Anesthesia Procedure Notes (Signed)
Procedure Name: MAC Date/Time: 08/27/2020 11:40 AM Performed by: West Pugh, CRNA Pre-anesthesia Checklist: Patient identified, Emergency Drugs available, Suction available, Patient being monitored and Timeout performed Patient Re-evaluated:Patient Re-evaluated prior to induction Preoxygenation: Pre-oxygenation with 100% oxygen Induction Type: IV induction Placement Confirmation: positive ETCO2 Dental Injury: Teeth and Oropharynx as per pre-operative assessment

## 2020-08-27 NOTE — Op Note (Signed)
OPERATIVE REPORT-TOTAL KNEE ARTHROPLASTY   Pre-operative diagnosis- Osteoarthritis  Right knee(s)  Post-operative diagnosis- Osteoarthritis Right knee(s)  Procedure-  Right  Total Knee Arthroplasty  Surgeon- Omar Plover. Keshon Markovitz, MD  Assistant- Theresa Duty, PA-C   Anesthesia-  Adductor canal block and spinal  EBL- 25 ml   Drains None  Tourniquet time-  Total Tourniquet Time Documented: Thigh (Right) - 38 minutes Total: Thigh (Right) - 38 minutes     Complications- None  Condition-PACU - hemodynamically stable.   Brief Clinical Note  Omar Ruiz is a 70 y.o. year old male with end stage OA of his right knee with progressively worsening pain and dysfunction. He has constant pain, with activity and at rest and significant functional deficits with difficulties even with ADLs. He has had extensive non-op management including analgesics, injections of cortisone and viscosupplements, and home exercise program, but remains in significant pain with significant dysfunction. Radiographs show bone on bone arthritis medial and patellofemoral. He presents now for right Total Knee Arthroplasty.    Procedure in detail---   The patient is brought into the operating room and positioned supine on the operating table. After successful administration of  Adductor canal block and spinal,   a tourniquet is placed high on the  Right thigh(s) and the lower extremity is prepped and draped in the usual sterile fashion. Time out is performed by the operating team and then the  Right lower extremity is wrapped in Esmarch, knee flexed and the tourniquet inflated to 300 mmHg.       A midline incision is made with a ten blade through the subcutaneous tissue to the level of the extensor mechanism. A fresh blade is used to make a medial parapatellar arthrotomy. Soft tissue over the proximal medial tibia is subperiosteally elevated to the joint line with a knife and into the semimembranosus bursa with a  Cobb elevator. Soft tissue over the proximal lateral tibia is elevated with attention being paid to avoiding the patellar tendon on the tibial tubercle. The patella is everted, knee flexed 90 degrees and the ACL and PCL are removed. Findings are bone on bone medial and patellofemoral with global osteophytes.       The drill is used to create a starting hole in the distal femur and the canal is thoroughly irrigated with sterile saline to remove the fatty contents. The 5 degree Right  valgus alignment guide is placed into the femoral canal and the distal femoral cutting block is pinned to remove 9 mm off the distal femur. Resection is made with an oscillating saw.      The tibia is subluxed forward and the menisci are removed. The extramedullary alignment guide is placed referencing proximally at the medial aspect of the tibial tubercle and distally along the second metatarsal axis and tibial crest. The block is pinned to remove 48mm off the more deficient medial  side. Resection is made with an oscillating saw. Size 8is the most appropriate size for the tibia and the proximal tibia is prepared with the modular drill and keel punch for that size.      The femoral sizing guide is placed and size 8 is most appropriate. Rotation is marked off the epicondylar axis and confirmed by creating a rectangular flexion gap at 90 degrees. The size 8 cutting block is pinned in this rotation and the anterior, posterior and chamfer cuts are made with the oscillating saw. The intercondylar block is then placed and that cut is made.  Trial size 8 tibial component, trial size 8 posterior stabilized femur and a 10  mm posterior stabilized rotating platform insert trial is placed. Full extension is achieved with excellent varus/valgus and anterior/posterior balance throughout full range of motion. The patella is everted and thickness measured to be 27  mm. Free hand resection is taken to 15 mm, a 41 template is placed, lug holes  are drilled, trial patella is placed, and it tracks normally. Osteophytes are removed off the posterior femur with the trial in place. All trials are removed and the cut bone surfaces prepared with pulsatile lavage. Cement is mixed and once ready for implantation, the size 8 tibial implant, size  8 posterior stabilized femoral component, and the size 41 patella are cemented in place and the patella is held with the clamp. The trial insert is placed and the knee held in full extension. The Exparel (20 ml mixed with 60 ml saline) is injected into the extensor mechanism, posterior capsule, medial and lateral gutters and subcutaneous tissues.  All extruded cement is removed and once the cement is hard the permanent 10 mm posterior stabilized rotating platform insert is placed into the tibial tray.      The wound is copiously irrigated with saline solution and the extensor mechanism closed with # 0 Stratofix suture. The tourniquet is released for a total tourniquet time of 38  minutes. Flexion against gravity is 140 degrees and the patella tracks normally. Subcutaneous tissue is closed with 2.0 vicryl and subcuticular with running 4.0 Monocryl. The incision is cleaned and dried and steri-strips and a bulky sterile dressing are applied. The limb is placed into a knee immobilizer and the patient is awakened and transported to recovery in stable condition.      Please note that a surgical assistant was a medical necessity for this procedure in order to perform it in a safe and expeditious manner. Surgical assistant was necessary to retract the ligaments and vital neurovascular structures to prevent injury to them and also necessary for proper positioning of the limb to allow for anatomic placement of the prosthesis.   Omar Plover Nolan Lasser, MD    08/27/2020, 1:01 PM

## 2020-08-27 NOTE — Evaluation (Signed)
Physical Therapy Evaluation Patient Details Name: Omar Ruiz MRN: [id number] DOB: 1950-12-25 Today's Date: 08/27/2020   History of Present Illness  Patient is 70 y.o. male s/p Rt TKA on 08/27/20 with PMH significant for HTN, HLD, COPD, OA, obesity, Rt TSA.  Clinical Impression  Omar Ruiz is a 70 y.o. male POD 0 s/p Rt TKA. Patient reports independence with mobility at baseline. Patient is now limited by functional impairments (see PT problem list below) and requires min assist/guard for transfers and gait with RW. Patient was able to ambulate ~45 feet with RW and min guard/assist. Patient instructed in exercise to facilitate ROM and circulation to manage edema and reduce risk of DVT. Patient will benefit from continued skilled PT interventions to address impairments and progress towards PLOF. Acute PT will follow to progress mobility and stair training in preparation for safe discharge home.     Follow Up Recommendations Follow surgeon's recommendation for DC plan and follow-up therapies;Outpatient PT    Equipment Recommendations  Rolling walker with 5" wheels    Recommendations for Other Services       Precautions / Restrictions Precautions Precautions: Fall Restrictions Weight Bearing Restrictions: No Other Position/Activity Restrictions: WBAT      Mobility  Bed Mobility Overal bed mobility: Needs Assistance Bed Mobility: Supine to Sit     Supine to sit: Min guard;HOB elevated     General bed mobility comments: min cues to initiate, pt using bed rail and HOB elevated    Transfers Overall transfer level: Needs assistance Equipment used: Rolling walker (2 wheeled) Transfers: Sit to/from Stand Sit to Stand: Min assist;From elevated surface         General transfer comment: cues for hand placement, min assist to steady with completion of power up. pt steady once standing.  Ambulation/Gait Ambulation/Gait assistance: Min assist;Min guard Gait Distance  (Feet): 45 Feet Assistive device: Rolling walker (2 wheeled) Gait Pattern/deviations: Step-to pattern;Decreased stride length;Decreased weight shift to right Gait velocity: decr   General Gait Details: VC's for step pattern and proximity to RW. assist initially for walker position and pt progressed to min gaurd for safety. no overt LOB or buckling at Rt knee observed.  Stairs            Wheelchair Mobility    Modified Rankin (Stroke Patients Only)       Balance Overall balance assessment: Needs assistance Sitting-balance support: Feet supported Sitting balance-Leahy Scale: Good     Standing balance support: During functional activity;Bilateral upper extremity supported Standing balance-Leahy Scale: Fair                               Pertinent Vitals/Pain Pain Assessment: Faces Faces Pain Scale: Hurts a little bit Pain Location: Rt thigh and behind knee Pain Descriptors / Indicators: Aching;Discomfort;Sore Pain Intervention(s): Limited activity within patient's tolerance;Monitored during session;Repositioned;Ice applied    Home Living Family/patient expects to be discharged to:: Private residence Living Arrangements: Spouse/significant other Available Help at Discharge: Family Type of Home: House Home Access: Stairs to enter Entrance Stairs-Rails: Right Entrance Stairs-Number of Steps: 3 Home Layout: One level Home Equipment: Walker - standard;Cane - single point;Bedside commode;Shower seat - built in      Prior Function Level of Independence: Independent;Independent with assistive device(s)         Comments: pt using SPC as needed due to pain     Hand Dominance   Dominant Hand: Right  Extremity/Trunk Assessment   Upper Extremity Assessment Upper Extremity Assessment: Overall WFL for tasks assessed    Lower Extremity Assessment Lower Extremity Assessment: RLE deficits/detail RLE Deficits / Details: good quad activation, no extensor  lag with SLR RLE Sensation: WNL RLE Coordination: WNL    Cervical / Trunk Assessment Cervical / Trunk Assessment: Normal  Communication   Communication: No difficulties  Cognition Arousal/Alertness: Awake/alert Behavior During Therapy: WFL for tasks assessed/performed Overall Cognitive Status: Within Functional Limits for tasks assessed                                        General Comments      Exercises Total Joint Exercises Ankle Circles/Pumps: AROM;Both;20 reps;Seated Quad Sets: AROM;Right;5 reps;Seated Heel Slides: AROM;Right;5 reps;Seated   Assessment/Plan    PT Assessment Patient needs continued PT services  PT Problem List Decreased strength;Decreased range of motion;Decreased activity tolerance;Decreased balance;Decreased mobility;Decreased knowledge of use of DME;Decreased safety awareness;Decreased knowledge of precautions       PT Treatment Interventions DME instruction;Gait training;Therapeutic activities;Functional mobility training;Stair training;Therapeutic exercise;Balance training;Patient/family education    PT Goals (Current goals can be found in the Care Plan section)  Acute Rehab PT Goals Patient Stated Goal: get back to work PT Goal Formulation: With patient Time For Goal Achievement: 09/03/20 Potential to Achieve Goals: Good    Frequency 7X/week   Barriers to discharge        Co-evaluation               AM-PAC PT "6 Clicks" Mobility  Outcome Measure Help needed turning from your back to your side while in a flat bed without using bedrails?: None Help needed moving from lying on your back to sitting on the side of a flat bed without using bedrails?: A Little Help needed moving to and from a bed to a chair (including a wheelchair)?: A Little Help needed standing up from a chair using your arms (e.g., wheelchair or bedside chair)?: A Little Help needed to walk in hospital room?: A Little Help needed climbing 3-5 steps  with a railing? : A Little 6 Click Score: 19    End of Session Equipment Utilized During Treatment: Gait belt Activity Tolerance: Patient tolerated treatment well Patient left: in chair;with call bell/phone within reach;with chair alarm set;with family/visitor present Nurse Communication: Mobility status PT Visit Diagnosis: Muscle weakness (generalized) (M62.81);Difficulty in walking, not elsewhere classified (R26.2)    Time: 1840-1910 PT Time Calculation (min) (ACUTE ONLY): 30 min   Charges:   PT Evaluation $PT Eval Low Complexity: 1 Low PT Treatments $Gait Training: 8-22 mins        Verner Mould, DPT Acute Rehabilitation Services Office [phone] Pager [phone]    Jacques Navy 08/27/2020, 7:26 PM

## 2020-08-27 NOTE — Anesthesia Procedure Notes (Signed)
Anesthesia Regional Block: Adductor canal block   Pre-Anesthetic Checklist: ,, timeout performed, Correct Patient, Correct Site, Correct Laterality, Correct Procedure, Correct Position, site marked, Risks and benefits discussed,  Surgical consent,  Pre-op evaluation,  At surgeon's request and post-op pain management  Laterality: Right and Lower  Prep: chloraprep       Needles:  Injection technique: Single-shot  Needle Type: Echogenic Needle     Needle Length: 9cm  Needle Gauge: 21     Additional Needles:   Procedures:,,,, ultrasound used (permanent image in chart),,,,  Narrative:  Start time: 08/27/2020 10:53 AM End time: 08/27/2020 10:59 AM Injection made incrementally with aspirations every 5 mL.  Performed by: Personally  Anesthesiologist: Annye Asa, MD  Additional Notes: Pt identified in Holding room.  Monitors applied. Working IV access confirmed. Sterile prep R thigh.  #21ga ECHOgenic Arrow block needle into adductor canal with US guidance.  20cc 0.75% Ropivacaine injected incrementally after negative test dose.  Patient asymptomatic, VSS, no heme aspirated, tolerated well.  Jenita Seashore, MD

## 2020-08-28 DIAGNOSIS — Z87891 Personal history of nicotine dependence: Secondary | ICD-10-CM | POA: Diagnosis not present

## 2020-08-28 DIAGNOSIS — I2510 Atherosclerotic heart disease of native coronary artery without angina pectoris: Secondary | ICD-10-CM | POA: Diagnosis not present

## 2020-08-28 DIAGNOSIS — Z7982 Long term (current) use of aspirin: Secondary | ICD-10-CM | POA: Diagnosis not present

## 2020-08-28 DIAGNOSIS — Z7951 Long term (current) use of inhaled steroids: Secondary | ICD-10-CM | POA: Diagnosis not present

## 2020-08-28 DIAGNOSIS — Z79899 Other long term (current) drug therapy: Secondary | ICD-10-CM | POA: Diagnosis not present

## 2020-08-28 DIAGNOSIS — M1711 Unilateral primary osteoarthritis, right knee: Secondary | ICD-10-CM | POA: Diagnosis not present

## 2020-08-28 DIAGNOSIS — J45909 Unspecified asthma, uncomplicated: Secondary | ICD-10-CM | POA: Diagnosis not present

## 2020-08-28 DIAGNOSIS — I10 Essential (primary) hypertension: Secondary | ICD-10-CM | POA: Diagnosis not present

## 2020-08-28 DIAGNOSIS — Z791 Long term (current) use of non-steroidal anti-inflammatories (NSAID): Secondary | ICD-10-CM | POA: Diagnosis not present

## 2020-08-28 DIAGNOSIS — J449 Chronic obstructive pulmonary disease, unspecified: Secondary | ICD-10-CM | POA: Diagnosis not present

## 2020-08-28 DIAGNOSIS — E785 Hyperlipidemia, unspecified: Secondary | ICD-10-CM | POA: Diagnosis not present

## 2020-08-28 LAB — CBC
HCT: 37.1 % — ABNORMAL LOW (ref 39.0–52.0)
Hemoglobin: 12.5 g/dL — ABNORMAL LOW (ref 13.0–17.0)
MCH: 30.9 pg (ref 26.0–34.0)
MCHC: 33.7 g/dL (ref 30.0–36.0)
MCV: 91.8 fL (ref 80.0–100.0)
Platelets: 221 10*3/uL (ref 150–400)
RBC: 4.04 MIL/uL — ABNORMAL LOW (ref 4.22–5.81)
RDW: 13.2 % (ref 11.5–15.5)
WBC: 13 10*3/uL — ABNORMAL HIGH (ref 4.0–10.5)
nRBC: 0 % (ref 0.0–0.2)

## 2020-08-28 LAB — BASIC METABOLIC PANEL
Anion gap: 8 (ref 5–15)
BUN: 16 mg/dL (ref 8–23)
CO2: 25 mmol/L (ref 22–32)
Calcium: 8.6 mg/dL — ABNORMAL LOW (ref 8.9–10.3)
Chloride: 104 mmol/L (ref 98–111)
Creatinine, Ser: 1.02 mg/dL (ref 0.61–1.24)
GFR, Estimated: >60 mL/min (ref >60)
Glucose, Bld: 162 mg/dL — ABNORMAL HIGH (ref 70–99)
Potassium: 4.3 mmol/L (ref 3.5–5.1)
Sodium: 137 mmol/L (ref 135–145)

## 2020-08-28 MED ORDER — OXYCODONE HCL 5 MG PO TABS: 5.0000 mg | ORAL_TABLET | Freq: Four times a day (QID) | ORAL | 0 refills | Status: DC | PRN

## 2020-08-28 MED ORDER — GABAPENTIN 300 MG PO CAPS: ORAL_CAPSULE | ORAL | 0 refills | Status: DC

## 2020-08-28 MED ORDER — TRAMADOL HCL 50 MG PO TABS: 50.0000 mg | ORAL_TABLET | Freq: Four times a day (QID) | ORAL | 0 refills | Status: DC | PRN

## 2020-08-28 MED ORDER — RIVAROXABAN 10 MG PO TABS: 10.0000 mg | ORAL_TABLET | Freq: Every day | ORAL | 0 refills | Status: DC

## 2020-08-28 MED ORDER — ALUM & MAG HYDROXIDE-SIMETH 200-200-20 MG/5ML PO SUSP
30.0000 mL | Freq: Four times a day (QID) | ORAL | Status: DC | PRN
  Administered 2020-08-28: 30 mL via ORAL
  Filled 2020-08-28: qty 30

## 2020-08-28 MED ORDER — METHOCARBAMOL 500 MG PO TABS: 500.0000 mg | ORAL_TABLET | Freq: Four times a day (QID) | ORAL | 0 refills | Status: DC | PRN

## 2020-08-28 NOTE — Plan of Care (Signed)
Pt pain controlled throughout night with meds and ice and is resting with CPAP. Wife at bedside for support.

## 2020-08-28 NOTE — Progress Notes (Signed)
Physical Therapy Treatment Patient Details Name: Omar Ruiz MRN: [id number] DOB: 02/26/1951 Today's Date: 08/28/2020    History of Present Illness Patient is 70 y.o. male s/p Rt TKA on 08/27/20 with PMH significant for HTN, HLD, COPD, OA, obesity, Rt TSA.    PT Comments    Pt is POD # 1 and is progressing well.  Pt demonstrates safe gait & transfers in order to return home from PT perspective once discharged by MD.  While in hospital, will continue to benefit from PT for skilled therapy to advance mobility and exercises.  Ambulated 69' with RW and supervision.     Follow Up Recommendations  Follow surgeon's recommendation for DC plan and follow-up therapies;Supervision for mobility/OOB     Equipment Recommendations  Rolling walker with 5" wheels (Pt has standard walker and is purchasing wheels on his own)    Recommendations for Other Services       Precautions / Restrictions Precautions Precautions: Fall Restrictions Weight Bearing Restrictions: No Other Position/Activity Restrictions: WBAT    Mobility  Bed Mobility Overal bed mobility: Needs Assistance Bed Mobility: Supine to Sit     Supine to sit: Supervision;HOB elevated     General bed mobility comments: in chair at arrival    Transfers Overall transfer level: Needs assistance Equipment used: Rolling walker (2 wheeled) Transfers: Sit to/from Stand Sit to Stand: Supervision         General transfer comment: Performed safely without cues - good carry over from AM session  Ambulation/Gait Ambulation/Gait assistance: Supervision Gait Distance (Feet): 90 Feet Assistive device: Rolling walker (2 wheeled) Gait Pattern/deviations: Step-to pattern;Decreased stride length;Decreased weight shift to right Gait velocity: decr   General Gait Details: verbal cues for posture and RW proximity; slow but steady gait with no LOB; educated on setting home RW height   Stairs Stairs: Yes Stairs assistance: Min  guard Stair Management: One rail Right;Step to pattern;Sideways Number of Stairs: 5 General stair comments: Started with 2 rails progressed to 1 rail on R sideways to mimic home set up.  Cues for sequencing initially but with good carry over   Wheelchair Mobility    Modified Rankin (Stroke Patients Only)       Balance Overall balance assessment: Needs assistance Sitting-balance support: Feet supported Sitting balance-Leahy Scale: Good     Standing balance support: No upper extremity supported;Bilateral upper extremity supported Standing balance-Leahy Scale: Fair Standing balance comment: RW for ambulation but was able to pull up shorts in standing without support                            Cognition Arousal/Alertness: Awake/alert Behavior During Therapy: WFL for tasks assessed/performed Overall Cognitive Status: Within Functional Limits for tasks assessed                                        Exercises Total Joint Exercises Ankle Circles/Pumps: AROM;Both;10 reps;Seated Quad Sets: AROM;Both;10 reps;Supine Towel Squeeze: AROM;Both;10 reps;Supine Heel Slides: AAROM;Right;Supine;5 reps Hip ABduction/ADduction: AAROM;Right;Supine;5 reps Long Arc Quad: AROM;Right;Seated;5 reps Knee Flexion: AROM;Right;Seated;5 reps Goniometric ROM: R knee 0 to 90 degrees without difficulty Other Exercises Other Exercises: Educated on exercises within pain tolerance, AAROM techniques using gait belt or L LE to assist. Did decrease ROM and # of reps due to soreness    General Comments General comments (skin integrity, edema, etc.): Educated on  safe ice use, no pivots, car transfers, resting with leg straight, and TED hose during day. Also, encouraged walking every 1-2 hours during day. Educated on HEP with focus on mobility the first weeks. Discussed doing exercises within pain control and if pain increasing could decreased ROM, reps, and stop exercises as needed.  Encouraged to perform quad sets and ankle pumps frequently for blood flow and to promote full knee extension.      Pertinent Vitals/Pain Pain Assessment: 0-10 Pain Score: 4  Faces Pain Scale: No hurt Pain Location: Rt thigh and behind knee Pain Descriptors / Indicators: Aching;Discomfort;Sore Pain Intervention(s): Limited activity within patient's tolerance;Monitored during session;Premedicated before session;Repositioned;Ice applied    Home Living                      Prior Function            PT Goals (current goals can now be found in the care plan section) Acute Rehab PT Goals Patient Stated Goal: get back to work PT Goal Formulation: With patient/family Time For Goal Achievement: 09/03/20 Potential to Achieve Goals: Good Progress towards PT goals: Progressing toward goals    Frequency    7X/week      PT Plan Current plan remains appropriate    Co-evaluation              AM-PAC PT "6 Clicks" Mobility   Outcome Measure  Help needed turning from your back to your side while in a flat bed without using bedrails?: None Help needed moving from lying on your back to sitting on the side of a flat bed without using bedrails?: A Little Help needed moving to and from a bed to a chair (including a wheelchair)?: A Little Help needed standing up from a chair using your arms (e.g., wheelchair or bedside chair)?: A Little Help needed to walk in hospital room?: A Little Help needed climbing 3-5 steps with a railing? : A Little 6 Click Score: 19    End of Session Equipment Utilized During Treatment: Gait belt Activity Tolerance: Patient tolerated treatment well Patient left: in chair;with call bell/phone within reach;with family/visitor present Nurse Communication: Mobility status (pt ready to d/c) PT Visit Diagnosis: Muscle weakness (generalized) (M62.81);Difficulty in walking, not elsewhere classified (R26.2)     Time: 5956-3875 PT Time Calculation (min)  (ACUTE ONLY): 20 min  Charges:  $Gait Training: 8-22 mins $Therapeutic Exercise: 8-22 mins                     Abran Richard, PT Acute Rehab Services Pager [phone] [facility] [place] 08/28/2020, 3:06 PM

## 2020-08-28 NOTE — Progress Notes (Signed)
Orthopedic Tech Progress Note Patient Details:  Omar Ruiz 07/30/1950 [id number]  Patient ID: Omar Ruiz, male   DOB: [birth year], 70 y.o.   MRN: [id number]   [name] 08/28/2020, 2:07 PMPickup CPM machine

## 2020-08-28 NOTE — TOC Transition Note (Signed)
Transition of Care [facility]) - CM/SW Discharge Note  Patient Details  Name: Omar Ruiz MRN: [id number] Date of Birth: Jun 17, 1950  [facility] CM/SW Contact:  Sherie Don, LCSW Phone Number: 08/28/2020, 9:27 AM  Clinical Narrative: Patient expected to discharge today after working with PT. CSW met with patient to review discharge plan. Patient will go to [facility] for OPPT and has a 3N1 and walker at home. Per [employer], patient's walker does not have wheels so patient will need to buy wheels to attach at [employer] or [employer] in [place]. Patient and wife are aware and are going to purchase wheels for the walker. No additional needs at this time. TOC signing off.  Final next level of care: OP Rehab Barriers to Discharge: No Barriers Identified  Patient Goals and CMS Choice Patient states their goals for this hospitalization and ongoing recovery are:: Discharge home with OPPT through [place] Medicare.gov Compare Post Acute Care list provided to:: Patient Choice offered to / list presented to : NA  Discharge Plan and Services         DME Arranged: N/A DME Agency: NA  Readmission Risk Interventions No flowsheet data found.

## 2020-08-28 NOTE — Progress Notes (Signed)
Subjective: 1 Day Post-Op Procedure(s) (LRB): TOTAL KNEE ARTHROPLASTY (Right) Patient reports pain as mild.   Patient seen in rounds by Dr. Wynelle Link. Patient is well, and has had no acute complaints or problems. No issues overnight, states he is ready to go home. Denies chest pain, SOB, or calf pain. Foley catheter to be removed this AM. We will continue therapy today, ambulated 68' yesterday.   Objective: Vital signs in last 24 hours: Temp:  [97.5 F (36.4 C)-98 F (36.7 C)] 97.8 F (36.6 C) (04/19 0603) Pulse Rate:  [57-78] 72 (04/19 0603) Resp:  [7-18] 18 (04/19 0603) BP: (111-178)/(69-103) 114/77 (04/19 0603) SpO2:  [91 %-100 %] 95 % (04/19 0603) Weight:  [117.1 kg] 117.1 kg (04/18 2124)  Intake/Output from previous day:  Intake/Output Summary (Last 24 hours) at 08/28/2020 0751 Last data filed at 08/28/2020 0600 Gross per 24 hour  Intake 3240 ml  Output 3925 ml  Net -685 ml     Intake/Output this shift: No intake/output data recorded.  Labs: Recent Labs    08/28/20 0312  HGB 12.5*   Recent Labs    08/28/20 0312  WBC 13.0*  RBC 4.04*  HCT 37.1*  PLT 221   Recent Labs    08/28/20 0312  NA 137  K 4.3  CL 104  CO2 25  BUN 16  CREATININE 1.02  GLUCOSE 162*  CALCIUM 8.6*   No results for input(s): LABPT, INR in the last 72 hours.  Exam: General - Patient is Alert and Oriented Extremity - Neurologically intact Neurovascular intact Sensation intact distally Dorsiflexion/Plantar flexion intact Dressing - dressing C/D/I Motor Function - intact, moving foot and toes well on exam.   Past Medical History:  Diagnosis Date  . Arthritis   . Asthma   . CAD (coronary artery disease)    a. s/p DES to LAD in 2011 with low-risk NST in 02/2017  . Cancer ([place])    skin cancer on hand, cancer removed  . Chest pain 07/2004   2D Echo EF>55%  . COPD (chronic obstructive pulmonary disease) ([place])   . COPD, mild ([place])    pt states he had study done and he does  not have this anymore  . Hyperlipemia   . Hypertension 10/2004   stress test EF 57%  . Obesity   . Palpitation   . Sleep apnea    CPAP 4.5 pressure setting    Assessment/Plan: 1 Day Post-Op Procedure(s) (LRB): TOTAL KNEE ARTHROPLASTY (Right) Principal Problem:   Osteoarthritis, knee Active Problems:   Primary osteoarthritis of right knee  Estimated body mass index is 34.06 kg/m as calculated from the following:   Height as of this encounter: 6\' 1"  (1.854 m).   Weight as of this encounter: 117.1 kg. Advance diet Up with therapy D/C IV fluids   Patient's anticipated LOS is less than 2 midnights, meeting these requirements: - Younger than 61 - Lives within 1 hour of care - Has a competent adult at home to recover with post-op recover - NO history of  - Chronic pain requiring opiods  - Diabetes  - Heart failure  - Heart attack  - Stroke  - DVT/VTE  - Cardiac arrhythmia  - Respiratory Failure/COPD  - Renal failure  - Anemia  - Advanced Liver disease  DVT Prophylaxis - Xarelto Weight bearing as tolerated. Continue therapy.  Plan is to go Home after hospital stay. Plan for discharge later today if progresses with therapy and meeting his goals. Scheduled for  OPPT at [facility] [place] Follow-up in the office in 2 weeks.  The PDMP database was reviewed today prior to any opioid medications being prescribed to this patient.  Theresa Duty, PA-C Orthopedic Surgery [phone] 08/28/2020, 7:51 AM

## 2020-08-28 NOTE — Progress Notes (Signed)
Physical Therapy Treatment Patient Details Name: Omar Ruiz MRN: [id number] DOB: 1950-07-31 Today's Date: 08/28/2020    History of Present Illness Patient is 70 y.o. male s/p Rt TKA on 08/27/20 with PMH significant for HTN, HLD, COPD, OA, obesity, Rt TSA.    PT Comments    Pt is POD # 1 and is progressing well.  Pt demonstrates safe gait & transfers in order to return home from PT perspective once discharged by MD.  While in hospital, will continue to benefit from PT for skilled therapy to advance mobility and exercises.  Pt ambulated 58' with RW and min guard and was able to perform steps with 1 rail.  Pt and family prefer second session of PT before d/c- will f/u in afternoon.    Follow Up Recommendations  Follow surgeon's recommendation for DC plan and follow-up therapies;Supervision for mobility/OOB     Equipment Recommendations  Rolling walker with 5" wheels    Recommendations for Other Services       Precautions / Restrictions Precautions Precautions: Fall Restrictions Weight Bearing Restrictions: No Other Position/Activity Restrictions: WBAT    Mobility  Bed Mobility Overal bed mobility: Needs Assistance Bed Mobility: Supine to Sit     Supine to sit: Supervision;HOB elevated          Transfers Overall transfer level: Needs assistance Equipment used: Rolling walker (2 wheeled) Transfers: Sit to/from Stand Sit to Stand: Min guard         General transfer comment: Performed x 2 with cues for hand placement and R LE management  Ambulation/Gait Ambulation/Gait assistance: Min guard Gait Distance (Feet): 80 Feet Assistive device: Rolling walker (2 wheeled) Gait Pattern/deviations: Step-to pattern;Decreased stride length;Decreased weight shift to right Gait velocity: decr   General Gait Details: verbal cues for posture and RW proximity; slow but steady gait with no LOB   Stairs Stairs: Yes Stairs assistance: Min guard Stair Management: One rail  Right;Step to pattern;Sideways Number of Stairs: 5 General stair comments: Started with 2 rails progressed to 1 rail on R sideways to mimic home set up.  Cues for sequencing initially but with good carry over   Wheelchair Mobility    Modified Rankin (Stroke Patients Only)       Balance Overall balance assessment: Needs assistance Sitting-balance support: Feet supported Sitting balance-Leahy Scale: Good     Standing balance support: No upper extremity supported;Bilateral upper extremity supported Standing balance-Leahy Scale: Fair Standing balance comment: RW for ambulation but was able to pull up shorts in standing without support                            Cognition Arousal/Alertness: Awake/alert Behavior During Therapy: WFL for tasks assessed/performed Overall Cognitive Status: Within Functional Limits for tasks assessed                                        Exercises Total Joint Exercises Ankle Circles/Pumps: AROM;Both;10 reps;Seated Quad Sets: AROM;Both;10 reps;Supine Towel Squeeze: AROM;Both;10 reps;Supine Heel Slides: AAROM;Right;10 reps;Supine Hip ABduction/ADduction: AAROM;Right;10 reps;Supine Long Arc Quad: AROM;Right;10 reps;Seated Knee Flexion: AROM;Right;10 reps;Seated Goniometric ROM: R knee 0 to 90 degrees without difficulty Other Exercises Other Exercises: Educated on exercises within pain tolerance, AAROM techniques using gait belt or L LE to assist.    General Comments General comments (skin integrity, edema, etc.): Educated on safe ice use, no pivots,  car transfers, resting with leg straight, and TED hose during day.  Also, encouraged walking every 1-2 hours during day.  Educated on HEP with focus on mobility the first weeks.  Discussed doing exercises within pain control and if pain increasing could decreased ROM, reps, and stop exercises as needed.  Encouraged to perform quad sets and ankle pumps frequently for blood flow and  to promote full knee extension.      Pertinent Vitals/Pain Pain Assessment: No/denies pain Faces Pain Scale: No hurt    Home Living                      Prior Function            PT Goals (current goals can now be found in the care plan section) Acute Rehab PT Goals Patient Stated Goal: get back to work PT Goal Formulation: With patient Time For Goal Achievement: 09/03/20 Potential to Achieve Goals: Good Progress towards PT goals: Progressing toward goals    Frequency    7X/week      PT Plan      Co-evaluation              AM-PAC PT "6 Clicks" Mobility   Outcome Measure  Help needed turning from your back to your side while in a flat bed without using bedrails?: None Help needed moving from lying on your back to sitting on the side of a flat bed without using bedrails?: A Little Help needed moving to and from a bed to a chair (including a wheelchair)?: A Little Help needed standing up from a chair using your arms (e.g., wheelchair or bedside chair)?: A Little Help needed to walk in hospital room?: A Little Help needed climbing 3-5 steps with a railing? : A Little 6 Click Score: 19    End of Session Equipment Utilized During Treatment: Gait belt Activity Tolerance: Patient tolerated treatment well Patient left: in chair;with call bell/phone within reach;with chair alarm set;with family/visitor present Nurse Communication: Mobility status PT Visit Diagnosis: Muscle weakness (generalized) (M62.81);Difficulty in walking, not elsewhere classified (R26.2)     Time: 1000-1036 PT Time Calculation (min) (ACUTE ONLY): 36 min  Charges:  $Gait Training: 8-22 mins $Therapeutic Exercise: 8-22 mins                     Abran Richard, PT Acute Rehab Services Pager [phone] [facility] 08/28/2020, 11:24 AM

## 2020-08-29 ENCOUNTER — Encounter (HOSPITAL_COMMUNITY): Payer: Self-pay | Admitting: Orthopedic Surgery

## 2020-08-29 NOTE — Discharge Summary (Signed)
Physician Discharge Summary   Patient ID: Omar Ruiz MRN: [id number] DOB/AGE: Dec 09, 1950 70 y.o.  Admit date: 08/27/2020 Discharge date: 08/28/2020  Primary Diagnosis: Osteoarthritis, right knee   Admission Diagnoses:  Past Medical History:  Diagnosis Date  . Arthritis   . Asthma   . CAD (coronary artery disease)    a. s/p DES to LAD in 2011 with low-risk NST in 02/2017  . Cancer ([place])    skin cancer on hand, cancer removed  . Chest pain 07/2004   2D Echo EF>55%  . COPD (chronic obstructive pulmonary disease) ([place])   . COPD, mild ([place])    pt states he had study done and he does not have this anymore  . Hyperlipemia   . Hypertension 10/2004   stress test EF 57%  . Obesity   . Palpitation   . Sleep apnea    CPAP 4.5 pressure setting   Discharge Diagnoses:   Principal Problem:   Osteoarthritis, knee Active Problems:   Primary osteoarthritis of right knee  Estimated body mass index is 34.06 kg/m as calculated from the following:   Height as of this encounter: 6\' 1"  (1.854 m).   Weight as of this encounter: 117.1 kg.  Procedure:  Procedure(s) (LRB): TOTAL KNEE ARTHROPLASTY (Right)   Consults: None  HPI: Omar Ruiz is a 70 y.o. year old male with end stage OA of his right knee with progressively worsening pain and dysfunction. He has constant pain, with activity and at rest and significant functional deficits with difficulties even with ADLs. He has had extensive non-op management including analgesics, injections of cortisone and viscosupplements, and home exercise program, but remains in significant pain with significant dysfunction. Radiographs show bone on bone arthritis medial and patellofemoral. He presents now for right Total Knee Arthroplasty.    Laboratory Data: Admission on 08/27/2020, Discharged on 08/28/2020  Component Date Value Ref Range Status  . WBC 08/28/2020 13.0* 4.0 - 10.5 K/uL Final  . RBC 08/28/2020 4.04* 4.22 - 5.81 MIL/uL Final  .  Hemoglobin 08/28/2020 12.5* 13.0 - 17.0 g/dL Final  . HCT 08/28/2020 37.1* 39.0 - 52.0 % Final  . MCV 08/28/2020 91.8  80.0 - 100.0 fL Final  . MCH 08/28/2020 30.9  26.0 - 34.0 pg Final  . MCHC 08/28/2020 33.7  30.0 - 36.0 g/dL Final  . RDW 08/28/2020 13.2  11.5 - 15.5 % Final  . Platelets 08/28/2020 221  150 - 400 K/uL Final  . nRBC 08/28/2020 0.0  0.0 - 0.2 % Final   Performed at [facility], [place] [address] [place], [place] [address]  . Sodium 08/28/2020 137  135 - 145 mmol/L Final  . Potassium 08/28/2020 4.3  3.5 - 5.1 mmol/L Final  . Chloride 08/28/2020 104  98 - 111 mmol/L Final  . CO2 08/28/2020 25  22 - 32 mmol/L Final  . Glucose, Bld 08/28/2020 162* 70 - 99 mg/dL Final   Glucose reference range applies only to samples taken after fasting for at least 8 hours.  . BUN 08/28/2020 16  8 - 23 mg/dL Final  . Creatinine, Ser 08/28/2020 1.02  0.61 - 1.24 mg/dL Final  . Calcium 08/28/2020 8.6* 8.9 - 10.3 mg/dL Final  . GFR, Estimated 08/28/2020 >60  >60 mL/min Final   Comment: (NOTE) Calculated using the CKD-EPI Creatinine Equation (2021)   . Anion gap 08/28/2020 8  5 - 15 Final   Performed at [facility], [address] [place], [place]  [place] Visit on 08/23/2020  Component Date Value Ref Range Status  . SARS Coronavirus 2 08/23/2020 NEGATIVE  NEGATIVE Final   Comment: (NOTE) SARS-CoV-2 target nucleic acids are NOT DETECTED.  The SARS-CoV-2 RNA is generally detectable in upper and lower respiratory specimens during the acute phase of infection. Negative results do not preclude SARS-CoV-2 infection, do not rule out co-infections with other pathogens, and should not be used as the sole basis for treatment or other patient management decisions. Negative results must be combined with clinical observations, patient history, and epidemiological information. The expected result is Negative.  Fact Sheet for  Patients: SugarRoll.be  Fact Sheet for Healthcare Providers: https://www.woods-mathews.com/  This test is not yet approved or cleared by the [place] FDA and  has been authorized for detection and/or diagnosis of SARS-CoV-2 by FDA under an Emergency Use Authorization (EUA). This EUA will remain  in effect (meaning this test can be used) for the duration of the COVID-19 declaration under Se                          ction 564(b)(1) of the Act, 21 U.S.C. section 360bbb-3(b)(1), unless the authorization is terminated or revoked sooner.  Performed at [facility], [place] [address] [place], [place] [address]   Hospital Outpatient Visit on 08/15/2020  Component Date Value Ref Range Status  . MRSA, PCR 08/15/2020 POSITIVE* NEGATIVE Final   Comment: RESULT CALLED TO, READ BACK BY AND VERIFIED WITH: NEAL G. 04.07.22 @ 0902 BY MECIAL J.   . Staphylococcus aureus 08/15/2020 POSITIVE* NEGATIVE Final   Comment: (NOTE) The Xpert SA Assay (FDA approved for NASAL specimens in patients 36 years of age and older), is one component of a comprehensive surveillance program. It is not intended to diagnose infection nor to guide or monitor treatment. Performed at [facility], [place] [address], [place], [place] [address]   . WBC 08/15/2020 5.7  4.0 - 10.5 K/uL Final  . RBC 08/15/2020 4.74  4.22 - 5.81 MIL/uL Final  . Hemoglobin 08/15/2020 14.8  13.0 - 17.0 g/dL Final  . HCT 08/15/2020 43.6  39.0 - 52.0 % Final  . MCV 08/15/2020 92.0  80.0 - 100.0 fL Final  . MCH 08/15/2020 31.2  26.0 - 34.0 pg Final  . MCHC 08/15/2020 33.9  30.0 - 36.0 g/dL Final  . RDW 08/15/2020 13.5  11.5 - 15.5 % Final  . Platelets 08/15/2020 237  150 - 400 K/uL Final  . nRBC 08/15/2020 0.0  0.0 - 0.2 % Final   Performed at [facility], [place] [address] [place], [place] [address]  . Sodium 08/15/2020 143  135 - 145 mmol/L Final  . Potassium  08/15/2020 4.0  3.5 - 5.1 mmol/L Final  . Chloride 08/15/2020 109  98 - 111 mmol/L Final  . CO2 08/15/2020 26  22 - 32 mmol/L Final  . Glucose, Bld 08/15/2020 92  70 - 99 mg/dL Final   Glucose reference range applies only to samples taken after fasting for at least 8 hours.  . BUN 08/15/2020 11  8 - 23 mg/dL Final  . Creatinine, Ser 08/15/2020 0.98  0.61 - 1.24 mg/dL Final  . Calcium 08/15/2020 9.2  8.9 - 10.3 mg/dL Final  . Total Protein 08/15/2020 7.1  6.5 - 8.1 g/dL Final  . Albumin 08/15/2020 4.3  3.5 - 5.0 g/dL Final  . AST 08/15/2020 20  15 - 41  U/L Final  . ALT 08/15/2020 21  0 - 44 U/L Final  . Alkaline Phosphatase 08/15/2020 54  38 - 126 U/L Final  . Total Bilirubin 08/15/2020 0.7  0.3 - 1.2 mg/dL Final  . GFR, Estimated 08/15/2020 >60  >60 mL/min Final   Comment: (NOTE) Calculated using the CKD-EPI Creatinine Equation (2021)   . Anion gap 08/15/2020 8  5 - 15 Final   Performed at [facility], [place] [address] [place], [place] [address]  . Prothrombin Time 08/15/2020 13.0  11.4 - 15.2 seconds Final  . INR 08/15/2020 1.0  0.8 - 1.2 Final   Comment: (NOTE) INR goal varies based on device and disease states. Performed at [facility], [place] [address] [place], [place] [address]   . aPTT 08/15/2020 28  24 - 36 seconds Final   Performed at [facility], [place] [address], [place], [place] [address]  . ABO/RH(D) 08/15/2020 B NEG   Final  . Antibody Screen 08/15/2020 NEG   Final  . Sample Expiration 08/15/2020 08/30/2020,2359   Final  . Extend sample reason 08/15/2020    Final                   Value:NO TRANSFUSIONS OR PREGNANCY IN THE PAST 3 MONTHS Performed at [facility], [place] [address], [place], [place] [address]      X-Rays:No results found.  EKG: Orders placed or performed in visit on 07/06/20  . EKG 12-Lead     Hospital Course: Omar Ruiz is a 70 y.o. who was admitted to [facility]. They were brought to the operating room on 08/27/2020 and underwent Procedure(s): TOTAL KNEE ARTHROPLASTY.  Patient tolerated the procedure well and was later transferred to the recovery room and then to the orthopaedic floor for postoperative care. They were given PO and IV analgesics for pain control following their surgery. They were given 24 hours of postoperative antibiotics of  Anti-infectives (From admission, onward)   Start     Dose/Rate Route Frequency Ordered Stop   08/27/20 1800  ceFAZolin (ANCEF) IVPB 2g/100 mL premix        2 g 200 mL/hr over 30 Minutes Intravenous Every 6 hours 08/27/20 1514 08/28/20 0034   08/27/20 0915  ceFAZolin (ANCEF) IVPB 2g/100 mL premix        2 g 200 mL/hr over 30 Minutes Intravenous On call to O.R. 08/27/20 6160 08/27/20 1223     and started on DVT prophylaxis in the form of Xarelto.   PT and OT were ordered for total joint protocol. Discharge planning consulted to help with postop disposition and equipment needs.  Patient had a good night on the evening of surgery. They started to get up OOB with therapy on POD #0. Pt was seen during rounds and was ready to go home pending progress with therapy. He worked with therapy on POD #1 and was meeting his goals. Pt was discharged to home later that day in stable condition.  Diet: Cardiac diet Activity: WBAT Follow-up: in 2 weeks Disposition: Home with OPPT Discharged Condition: stable   Discharge Instructions    Call MD / Call 911   Complete by: As directed    If you experience chest pain or shortness of breath, CALL 911 and be transported to the hospital emergency room.  If you develope a fever above 101 F, pus (white drainage) or increased drainage or redness at the wound, or calf pain,  call your surgeon's office.   Change dressing   Complete by: As directed    You may remove the bulky bandage (ACE wrap and gauze) two days after surgery. You will have an adhesive waterproof bandage underneath.  Leave this in place until your first follow-up appointment.   Constipation Prevention   Complete by: As directed    Drink plenty of fluids.  Prune juice may be helpful.  You may use a stool softener, such as Colace (over the counter) 100 mg twice a day.  Use MiraLax (over the counter) for constipation as needed.   Diet - low sodium heart healthy   Complete by: As directed    Do not put a pillow under the knee. Place it under the heel.   Complete by: As directed    Driving restrictions   Complete by: As directed    No driving for two weeks   TED hose   Complete by: As directed    Use stockings (TED hose) for three weeks on both leg(s).  You may remove them at night for sleeping.   Weight bearing as tolerated   Complete by: As directed      Allergies as of 08/28/2020   No Known Allergies     Medication List    STOP taking these medications   aspirin 81 MG tablet   meloxicam 15 MG tablet Commonly known as: MOBIC   multivitamin with minerals tablet   OVER THE COUNTER MEDICATION   Turmeric Curcumin 500 MG Caps   Vitamin D3 125 MCG (5000 UT) Tabs     TAKE these medications   acetaminophen 500 MG tablet Commonly known as: TYLENOL Take 1,000 mg by mouth daily as needed for moderate pain or mild pain.   amoxicillin 500 MG capsule Commonly known as: AMOXIL Take 2,000 mg by mouth once.   gabapentin 300 MG capsule Commonly known as: NEURONTIN Take a 300 mg capsule three times a day for two weeks following surgery.Then take a 300 mg capsule two times a day for two weeks. Then take a 300 mg capsule once a day for two weeks. Then discontinue.   methocarbamol 500 MG tablet Commonly known as: ROBAXIN Take 1 tablet (500 mg total) by mouth every 6 (six) hours as needed for muscle spasms.   metoprolol succinate 25 MG 24 hr tablet Commonly known as: TOPROL-XL Take 1 tablet by mouth once daily   nitroGLYCERIN 0.4 MG SL tablet Commonly known as: NITROSTAT Place 1 tablet (0.4 mg  total) under the tongue every 5 (five) minutes as needed for chest pain.   oxyCODONE 5 MG immediate release tablet Commonly known as: Oxy IR/ROXICODONE Take 1-2 tablets (5-10 mg total) by mouth every 6 (six) hours as needed for severe pain.   rivaroxaban 10 MG Tabs tablet Commonly known as: XARELTO Take 1 tablet (10 mg total) by mouth daily with breakfast for 20 days. Then resume one 81 mg aspirin once a day.   simvastatin 40 MG tablet Commonly known as: ZOCOR Take 40 mg by mouth in the morning at 0400 What changed:   how much to take  how to take this  when to take this  additional instructions   traMADol 50 MG tablet Commonly known as: ULTRAM Take 1-2 tablets (50-100 mg total) by mouth every 6 (six) hours as needed for moderate pain.            Discharge Care Instructions  (From admission, onward)  Start     Ordered   08/28/20 0000  Weight bearing as tolerated        08/28/20 0755   08/28/20 0000  Change dressing       Comments: You may remove the bulky bandage (ACE wrap and gauze) two days after surgery. You will have an adhesive waterproof bandage underneath. Leave this in place until your first follow-up appointment.   08/28/20 0755          Follow-up Information    Gaynelle Arabian, MD. Go on 09/11/2020.   Specialty: Orthopedic Surgery Why: You are scheduled for first post op appointment on Tuesday May 3rd at 1:00pm. Contact information: [address] [place] [address] [phone]               Signed: Theresa Duty, PA-C Orthopedic Surgery 08/29/2020, 12:05 PM

## 2020-08-30 DIAGNOSIS — M25561 Pain in right knee: Secondary | ICD-10-CM | POA: Diagnosis not present

## 2020-08-30 DIAGNOSIS — Z96651 Presence of right artificial knee joint: Secondary | ICD-10-CM | POA: Diagnosis not present

## 2020-09-04 DIAGNOSIS — Z96651 Presence of right artificial knee joint: Secondary | ICD-10-CM | POA: Diagnosis not present

## 2020-09-04 DIAGNOSIS — M25561 Pain in right knee: Secondary | ICD-10-CM | POA: Diagnosis not present

## 2020-09-06 DIAGNOSIS — M25561 Pain in right knee: Secondary | ICD-10-CM | POA: Diagnosis not present

## 2020-09-06 DIAGNOSIS — Z96651 Presence of right artificial knee joint: Secondary | ICD-10-CM | POA: Diagnosis not present

## 2020-09-11 DIAGNOSIS — Z96651 Presence of right artificial knee joint: Secondary | ICD-10-CM | POA: Diagnosis not present

## 2020-09-11 DIAGNOSIS — M25561 Pain in right knee: Secondary | ICD-10-CM | POA: Diagnosis not present

## 2020-09-13 DIAGNOSIS — Z96651 Presence of right artificial knee joint: Secondary | ICD-10-CM | POA: Diagnosis not present

## 2020-09-13 DIAGNOSIS — M25561 Pain in right knee: Secondary | ICD-10-CM | POA: Diagnosis not present

## 2020-09-18 DIAGNOSIS — M25561 Pain in right knee: Secondary | ICD-10-CM | POA: Diagnosis not present

## 2020-09-18 DIAGNOSIS — Z96651 Presence of right artificial knee joint: Secondary | ICD-10-CM | POA: Diagnosis not present

## 2020-09-20 DIAGNOSIS — Z96651 Presence of right artificial knee joint: Secondary | ICD-10-CM | POA: Diagnosis not present

## 2020-09-20 DIAGNOSIS — M25561 Pain in right knee: Secondary | ICD-10-CM | POA: Diagnosis not present

## 2020-09-25 DIAGNOSIS — M25561 Pain in right knee: Secondary | ICD-10-CM | POA: Diagnosis not present

## 2020-09-25 DIAGNOSIS — Z96651 Presence of right artificial knee joint: Secondary | ICD-10-CM | POA: Diagnosis not present

## 2020-09-27 DIAGNOSIS — Z96651 Presence of right artificial knee joint: Secondary | ICD-10-CM | POA: Diagnosis not present

## 2020-09-27 DIAGNOSIS — M25561 Pain in right knee: Secondary | ICD-10-CM | POA: Diagnosis not present

## 2020-10-04 DIAGNOSIS — Z96651 Presence of right artificial knee joint: Secondary | ICD-10-CM | POA: Diagnosis not present

## 2020-10-04 DIAGNOSIS — Z471 Aftercare following joint replacement surgery: Secondary | ICD-10-CM | POA: Diagnosis not present

## 2020-10-09 DIAGNOSIS — Z96651 Presence of right artificial knee joint: Secondary | ICD-10-CM | POA: Diagnosis not present

## 2020-10-09 DIAGNOSIS — M25561 Pain in right knee: Secondary | ICD-10-CM | POA: Diagnosis not present

## 2020-10-11 DIAGNOSIS — Z96651 Presence of right artificial knee joint: Secondary | ICD-10-CM | POA: Diagnosis not present

## 2020-10-11 DIAGNOSIS — M25561 Pain in right knee: Secondary | ICD-10-CM | POA: Diagnosis not present

## 2020-11-09 ENCOUNTER — Telehealth: Payer: Self-pay

## 2020-11-09 MED ORDER — METOPROLOL SUCCINATE ER 25 MG PO TB24: 25.0000 mg | ORAL_TABLET | Freq: Every day | ORAL | 3 refills | Status: DC

## 2020-11-09 NOTE — Telephone Encounter (Signed)
Medication refill request approved for Metoprolol 25 mg tablets and sent to [facility] per pt request.

## 2020-12-10 DIAGNOSIS — I10 Essential (primary) hypertension: Secondary | ICD-10-CM | POA: Diagnosis not present

## 2020-12-10 DIAGNOSIS — Z23 Encounter for immunization: Secondary | ICD-10-CM | POA: Diagnosis not present

## 2020-12-10 DIAGNOSIS — Z0000 Encounter for general adult medical examination without abnormal findings: Secondary | ICD-10-CM | POA: Diagnosis not present

## 2020-12-10 DIAGNOSIS — M1711 Unilateral primary osteoarthritis, right knee: Secondary | ICD-10-CM | POA: Diagnosis not present

## 2020-12-10 DIAGNOSIS — E782 Mixed hyperlipidemia: Secondary | ICD-10-CM | POA: Diagnosis not present

## 2020-12-10 DIAGNOSIS — F1010 Alcohol abuse, uncomplicated: Secondary | ICD-10-CM | POA: Insufficient documentation

## 2020-12-10 DIAGNOSIS — E669 Obesity, unspecified: Secondary | ICD-10-CM | POA: Insufficient documentation

## 2020-12-14 DIAGNOSIS — G4733 Obstructive sleep apnea (adult) (pediatric): Secondary | ICD-10-CM | POA: Diagnosis not present

## 2021-01-21 DIAGNOSIS — Z0000 Encounter for general adult medical examination without abnormal findings: Secondary | ICD-10-CM | POA: Diagnosis not present

## 2021-01-21 DIAGNOSIS — Z23 Encounter for immunization: Secondary | ICD-10-CM | POA: Diagnosis not present

## 2021-01-21 DIAGNOSIS — I10 Essential (primary) hypertension: Secondary | ICD-10-CM | POA: Diagnosis not present

## 2021-01-21 DIAGNOSIS — Z8619 Personal history of other infectious and parasitic diseases: Secondary | ICD-10-CM | POA: Diagnosis not present

## 2021-07-15 NOTE — Progress Notes (Signed)
?Cardiology Office Note:   ? ?Date:  07/16/2021  ? ?ID:  Omar Ruiz, DOB 1951-03-18, MRN [id number] ? ?PCP:  [name], NP ?  ?[facility] Providers ?Cardiologist:  None    ? ?Referring MD: [name], MD  ? ?CC: establish new MD ? ?History of Present Illness:   ? ?Omar Ruiz is a 71 y.o. male with a hx of CAD s/p 2011 LAD DES, HTN, HLD, and OSA. ? ?Patient notes that he is doing fine.   ?Was on Xarelto only for knee surgery, back on ASA (April 2022). 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 ?There are no interval hospital/ED visit.   ? ?No chest pain or pressure .  No SOB/DOE and no PND/Orthopnea.  No weight gain or leg swelling.  No palpitations or syncope .  When he retired he stopped using CPAP (had issues with his SOB). ?Use no nitro. ? ?Anginal equivalent was DOE. ? ?Ambulatory blood pressure 135/80. ? ? ?Past Medical History:  ?Diagnosis Date  ? Arthritis   ? Asthma   ? CAD (coronary artery disease)   ? a. s/p DES to LAD in 2011 with low-risk NST in 02/2017  ? Cancer [facility])   ? skin cancer on hand, cancer removed  ? Chest pain 07/2004  ? 2D Echo EF>55%  ? COPD (chronic obstructive pulmonary disease) ([place])   ? COPD, mild ([place])   ? pt states he had study done and he does not have this anymore  ? Hyperlipemia   ? Hypertension 10/2004  ? stress test EF 57%  ? Obesity   ? Palpitation   ? Sleep apnea   ? CPAP 4.5 pressure setting  ? ? ?Past Surgical History:  ?Procedure Laterality Date  ? COLONOSCOPY  10/2004  ? COLONOSCOPY N/A 03/29/2013  ? Procedure: COLONOSCOPY;  Surgeon: [name], MD;  [facility];  Service: Gastroenterology;  Laterality: N/A;  ? MOUTH SURGERY    ? NASAL SINUS SURGERY    ? SKIN CANCER EXCISION    ? right hand   ? STENTS  09/05/2009  ? 3.0x26m promus drug eluting stent for a 90% mid LAD artery stenosis  ? TOTAL KNEE ARTHROPLASTY Right 08/27/2020  ? Procedure: TOTAL KNEE ARTHROPLASTY;  Surgeon: [name] MD;  Location: [facility];  Service:  Orthopedics;  Laterality: Right;  552m  ? TOTAL SHOULDER ARTHROPLASTY Right 12/24/2017  ? Procedure: RIGHT TOTAL SHOULDER ARTHROPLASTY;  Surgeon: ChTania AdeMD;  [facility] Service: Orthopedics;  Laterality: Right;  ? ? ?Current Medications: ?Current Meds  ?Medication Sig  ? acetaminophen (TYLENOL) 500 MG tablet Take 1,000 mg by mouth daily as needed for moderate pain or mild pain.  ? amoxicillin (AMOXIL) 500 MG capsule Take 2,000 mg by mouth once.  ? gabapentin (NEURONTIN) 300 MG capsule Take a 300 mg capsule three times a day for two weeks following surgery.Then take a 300 mg capsule two times a day for two weeks. Then take a 300 mg capsule once a day for two weeks. Then discontinue.  ? methocarbamol (ROBAXIN) 500 MG tablet Take 1 tablet (500 mg total) by mouth every 6 (six) hours as needed for muscle spasms.  ? metoprolol succinate (TOPROL-XL) 25 MG 24 hr tablet Take 1 tablet (25 mg total) by mouth daily.  ? nitroGLYCERIN (NITROSTAT) 0.4 MG SL tablet Place 1 tablet (0.4 mg total) under the tongue every 5 (five) minutes as needed for chest pain.  ?  oxyCODONE (OXY IR/ROXICODONE) 5 MG immediate release tablet Take 1-2 tablets (5-10 mg total) by mouth every 6 (six) hours as needed for severe pain.  ? simvastatin (ZOCOR) 40 MG tablet Take 40 mg by mouth in the morning at 0400 (Patient taking differently: Take 40 mg by mouth daily. 0400)  ? traMADol (ULTRAM) 50 MG tablet Take 1-2 tablets (50-100 mg total) by mouth every 6 (six) hours as needed for moderate pain.  ?  ? ?Allergies:   Patient has no known allergies.  ? ?Social History  ? ?Socioeconomic History  ? Marital status: Married  ?  Spouse name: Not on file  ? Number of children: Not on file  ? Years of education: 58  ? Highest education level: Not on file  ?Occupational History  ? Not on file  ?Tobacco Use  ? Smoking status: Former  ?  Types: Cigarettes  ?  Quit date: 05/12/1981  ?  Years since quitting: 40.2  ? Smokeless tobacco: Never  ?Vaping Use   ? Vaping Use: Never used  ?Substance and Sexual Activity  ? Alcohol use: Yes  ?  Alcohol/week: 12.0 standard drinks  ?  Types: 12 Cans of beer per week  ? Drug use: No  ? Sexual activity: Yes  ?Other Topics Concern  ? Not on file  ?Social History Narrative  ? Not on file  ? ?Social Determinants of Health  ? ?Financial Resource Strain: Not on file  ?Food Insecurity: Not on file  ?Transportation Needs: Not on file  ?Physical Activity: Not on file  ?Stress: Not on file  ?Social Connections: Not on file  ?  ? ?Family History: ?The patient's family history includes Asthma in an other family member; CAD in his brother; Diabetes in his mother and another family member; Heart disease in his father; Lung disease in an other family member. ? ?ROS:   ?Please see the history of present illness.    ? All other systems reviewed and are negative. ? ?EKGs/Labs/Other Studies Reviewed:   ? ?The following studies were reviewed today: ? ?EKG:  EKG is  ordered today.  The ekg ordered today demonstrates ?07/16/21: SR rate 74 ? ?Recent Labs: ?08/15/2020: ALT 21 ?08/28/2020: BUN 16; Creatinine, Ser 1.02; Hemoglobin 12.5; Platelets 221; Potassium 4.3; Sodium 137  ?Recent Lipid Panel ?   ?Component Value Date/Time  ? CHOL 135 02/23/2017 0805  ? TRIG 97 02/23/2017 0805  ? HDL 44 02/23/2017 0805  ? CHOLHDL 3.1 02/23/2017 0805  ? VLDL 19 02/23/2017 0805  ? [place] 72 02/23/2017 0805  ? ?    ? ?Physical Exam:   ? ?VS:  BP (!) 160/96   Ht 6' (1.829 m)   Wt 257 lb 6.4 oz (116.8 kg)   SpO2 94%   BMI 34.91 kg/m?    ? ?Wt Readings from Last 3 Encounters:  ?07/16/21 257 lb 6.4 oz (116.8 kg)  ?08/27/20 258 lb 2.5 oz (117.1 kg)  ?08/15/20 258 lb 3.2 oz (117.1 kg)  ?  ?Gen: No distress, morbid obesity ?Neck: No JVD,  ?Ears: bilateral Pilar Plate Sign ?Cardiac: No Rubs or Gallops, no Murmur, RRR, +2 radial pulses ?Respiratory: Clear to auscultation bilaterally, normal effort, normal  respiratory rate ?GI: Soft, nontender, non-distended  ?MS: +1  edema R > L  (s/p R knee surgery);  moves all extremities ?Integument: Skin feels warm ?Neuro:  At time of evaluation, alert and oriented to person/place/time/situation  ?Psych: Normal affect, patient feels well ? ? ?ASSESSMENT:   ? ?  1. Coronary artery disease involving native coronary artery of native heart without angina pectoris   ?2. Obstructive sleep apnea   ?3. Essential hypertension   ?4. Mixed hyperlipidemia   ? ?PLAN:   ? ?Coronary Artery Disease; Obstructive ?HTN ?HLD ?OSA no long on CPAP ?- stable angina controlled on succinate 25 mg PO daily will keep ?- anatomy: obstructive LAD s/p PCI ?- continue ASA 81 mg (correcting MAR) ?- continue statin, goal LDL < 55, will get records from PCP and make increase to rosuvastatin ?- AMB BP is at goal, asked patient to take more frequent AMB BP and if above 135/85 will add additional therapy ?- discussed non mask based therapies, will refer to sleep medicine ?- continue nitrates as PRN ? ?One year follow up ? ? ?Medication Adjustments/Labs and Tests Ordered: ?Current medicines are reviewed at length with the patient today.  Concerns regarding medicines are outlined above.  ?Orders Placed This Encounter  ?Procedures  ? Ambulatory referral to Pulmonology  ? EKG 12-Lead  ? ?No orders of the defined types were placed in this encounter. ? ? ?Patient Instructions  ?Follow-Up: ?Follow up in 1 year with Dr. Gasper Sells.  ? ?Any Other Special Instructions Will Be Listed Below (If Applicable). ? ? ? ? ?If you need a refill on your cardiac medications before your next appointment, please call your pharmacy. ? ?You have been referred to Pulmonology. They will contact you with your first appointment.   ? ?Signed, ?Werner Lean, MD  ?07/16/2021 1:48 PM    ?[place] ? ?

## 2021-07-16 ENCOUNTER — Ambulatory Visit (INDEPENDENT_AMBULATORY_CARE_PROVIDER_SITE_OTHER): Payer: Medicare PPO | Admitting: Internal Medicine

## 2021-07-16 ENCOUNTER — Other Ambulatory Visit: Payer: Self-pay

## 2021-07-16 ENCOUNTER — Encounter: Payer: Self-pay | Admitting: Internal Medicine

## 2021-07-16 VITALS — BP 160/96 | Ht 72.0 in | Wt 257.4 lb

## 2021-07-16 DIAGNOSIS — G4733 Obstructive sleep apnea (adult) (pediatric): Secondary | ICD-10-CM | POA: Insufficient documentation

## 2021-07-16 DIAGNOSIS — I2510 Atherosclerotic heart disease of native coronary artery without angina pectoris: Secondary | ICD-10-CM | POA: Insufficient documentation

## 2021-07-16 DIAGNOSIS — Z9861 Coronary angioplasty status: Secondary | ICD-10-CM | POA: Insufficient documentation

## 2021-07-16 DIAGNOSIS — E782 Mixed hyperlipidemia: Secondary | ICD-10-CM | POA: Insufficient documentation

## 2021-07-16 DIAGNOSIS — I10 Essential (primary) hypertension: Secondary | ICD-10-CM | POA: Diagnosis not present

## 2021-07-16 NOTE — Addendum Note (Signed)
Addended by: Christella Scheuermann C on: 07/16/2021 02:41 PM ? ? Modules accepted: Orders ? ?

## 2021-07-16 NOTE — Patient Instructions (Signed)
Follow-Up: ?Follow up in 1 year with Dr. Gasper Sells.  ? ?Any Other Special Instructions Will Be Listed Below (If Applicable). ? ? ? ? ?If you need a refill on your cardiac medications before your next appointment, please call your pharmacy. ? ?You have been referred to Pulmonology. They will contact you with your first appointment.  ?

## 2021-09-10 DIAGNOSIS — N401 Enlarged prostate with lower urinary tract symptoms: Secondary | ICD-10-CM | POA: Insufficient documentation

## 2021-09-10 DIAGNOSIS — E559 Vitamin D deficiency, unspecified: Secondary | ICD-10-CM | POA: Insufficient documentation

## 2021-09-10 DIAGNOSIS — F322 Major depressive disorder, single episode, severe without psychotic features: Secondary | ICD-10-CM | POA: Insufficient documentation

## 2021-09-12 ENCOUNTER — Observation Stay (HOSPITAL_COMMUNITY)
Admission: EM | Admit: 2021-09-12 | Discharge: 2021-09-13 | Disposition: A | Discharge status: Home / Self Care | Payer: Medicare PPO | Attending: Internal Medicine | Admitting: Internal Medicine

## 2021-09-12 ENCOUNTER — Emergency Department (HOSPITAL_COMMUNITY): Payer: Medicare PPO

## 2021-09-12 DIAGNOSIS — Z79899 Other long term (current) drug therapy: Secondary | ICD-10-CM | POA: Insufficient documentation

## 2021-09-12 DIAGNOSIS — I10 Essential (primary) hypertension: Secondary | ICD-10-CM | POA: Diagnosis not present

## 2021-09-12 DIAGNOSIS — R4701 Aphasia: Secondary | ICD-10-CM | POA: Insufficient documentation

## 2021-09-12 DIAGNOSIS — Z9861 Coronary angioplasty status: Secondary | ICD-10-CM

## 2021-09-12 DIAGNOSIS — J449 Chronic obstructive pulmonary disease, unspecified: Secondary | ICD-10-CM | POA: Insufficient documentation

## 2021-09-12 DIAGNOSIS — Z87891 Personal history of nicotine dependence: Secondary | ICD-10-CM | POA: Insufficient documentation

## 2021-09-12 DIAGNOSIS — Z6834 Body mass index (BMI) 34.0-34.9, adult: Secondary | ICD-10-CM | POA: Insufficient documentation

## 2021-09-12 DIAGNOSIS — R7989 Other specified abnormal findings of blood chemistry: Secondary | ICD-10-CM | POA: Insufficient documentation

## 2021-09-12 DIAGNOSIS — Z8249 Family history of ischemic heart disease and other diseases of the circulatory system: Secondary | ICD-10-CM | POA: Insufficient documentation

## 2021-09-12 DIAGNOSIS — R42 Dizziness and giddiness: Principal | ICD-10-CM

## 2021-09-12 DIAGNOSIS — Z85828 Personal history of other malignant neoplasm of skin: Secondary | ICD-10-CM | POA: Diagnosis not present

## 2021-09-12 DIAGNOSIS — R29818 Other symptoms and signs involving the nervous system: Secondary | ICD-10-CM | POA: Insufficient documentation

## 2021-09-12 DIAGNOSIS — Z96651 Presence of right artificial knee joint: Secondary | ICD-10-CM | POA: Insufficient documentation

## 2021-09-12 DIAGNOSIS — Z20822 Contact with and (suspected) exposure to covid-19: Secondary | ICD-10-CM | POA: Insufficient documentation

## 2021-09-12 DIAGNOSIS — Z96611 Presence of right artificial shoulder joint: Secondary | ICD-10-CM | POA: Insufficient documentation

## 2021-09-12 DIAGNOSIS — Z955 Presence of coronary angioplasty implant and graft: Secondary | ICD-10-CM | POA: Insufficient documentation

## 2021-09-12 DIAGNOSIS — R001 Bradycardia, unspecified: Secondary | ICD-10-CM

## 2021-09-12 DIAGNOSIS — E782 Mixed hyperlipidemia: Secondary | ICD-10-CM | POA: Diagnosis present

## 2021-09-12 DIAGNOSIS — R29700 NIHSS score 0: Secondary | ICD-10-CM | POA: Insufficient documentation

## 2021-09-12 DIAGNOSIS — Z7902 Long term (current) use of antithrombotics/antiplatelets: Secondary | ICD-10-CM | POA: Insufficient documentation

## 2021-09-12 DIAGNOSIS — J45909 Unspecified asthma, uncomplicated: Secondary | ICD-10-CM | POA: Insufficient documentation

## 2021-09-12 DIAGNOSIS — Z7982 Long term (current) use of aspirin: Secondary | ICD-10-CM | POA: Insufficient documentation

## 2021-09-12 DIAGNOSIS — G4733 Obstructive sleep apnea (adult) (pediatric): Secondary | ICD-10-CM | POA: Insufficient documentation

## 2021-09-12 DIAGNOSIS — R2681 Unsteadiness on feet: Secondary | ICD-10-CM | POA: Insufficient documentation

## 2021-09-12 DIAGNOSIS — E669 Obesity, unspecified: Secondary | ICD-10-CM | POA: Diagnosis not present

## 2021-09-12 DIAGNOSIS — R0602 Shortness of breath: Secondary | ICD-10-CM | POA: Insufficient documentation

## 2021-09-12 DIAGNOSIS — I6523 Occlusion and stenosis of bilateral carotid arteries: Secondary | ICD-10-CM | POA: Insufficient documentation

## 2021-09-12 DIAGNOSIS — I119 Hypertensive heart disease without heart failure: Secondary | ICD-10-CM | POA: Insufficient documentation

## 2021-09-12 DIAGNOSIS — R110 Nausea: Secondary | ICD-10-CM | POA: Diagnosis not present

## 2021-09-12 DIAGNOSIS — Z825 Family history of asthma and other chronic lower respiratory diseases: Secondary | ICD-10-CM | POA: Insufficient documentation

## 2021-09-12 DIAGNOSIS — I959 Hypotension, unspecified: Secondary | ICD-10-CM | POA: Insufficient documentation

## 2021-09-12 DIAGNOSIS — H5500 Unspecified nystagmus: Secondary | ICD-10-CM | POA: Diagnosis not present

## 2021-09-12 DIAGNOSIS — R0609 Other forms of dyspnea: Secondary | ICD-10-CM

## 2021-09-12 DIAGNOSIS — R9431 Abnormal electrocardiogram [ECG] [EKG]: Secondary | ICD-10-CM | POA: Insufficient documentation

## 2021-09-12 DIAGNOSIS — I2510 Atherosclerotic heart disease of native coronary artery without angina pectoris: Secondary | ICD-10-CM

## 2021-09-12 DIAGNOSIS — R4781 Slurred speech: Secondary | ICD-10-CM | POA: Diagnosis not present

## 2021-09-12 DIAGNOSIS — R479 Unspecified speech disturbances: Secondary | ICD-10-CM

## 2021-09-12 DIAGNOSIS — R2689 Other abnormalities of gait and mobility: Secondary | ICD-10-CM | POA: Insufficient documentation

## 2021-09-12 DIAGNOSIS — I4891 Unspecified atrial fibrillation: Secondary | ICD-10-CM | POA: Insufficient documentation

## 2021-09-12 DIAGNOSIS — I77810 Thoracic aortic ectasia: Secondary | ICD-10-CM | POA: Insufficient documentation

## 2021-09-12 DIAGNOSIS — M6281 Muscle weakness (generalized): Secondary | ICD-10-CM | POA: Insufficient documentation

## 2021-09-12 LAB — COMPREHENSIVE METABOLIC PANEL
ALT: 21 U/L (ref 0–44)
AST: 21 U/L (ref 15–41)
Albumin: 3.8 g/dL (ref 3.5–5.0)
Alkaline Phosphatase: 59 U/L (ref 38–126)
Anion gap: 7 (ref 5–15)
BUN: 14 mg/dL (ref 8–23)
CO2: 27 mmol/L (ref 22–32)
Calcium: 8.8 mg/dL — ABNORMAL LOW (ref 8.9–10.3)
Chloride: 104 mmol/L (ref 98–111)
Creatinine, Ser: 1.13 mg/dL (ref 0.61–1.24)
GFR, Estimated: >60 mL/min (ref >60)
Glucose, Bld: 115 mg/dL — ABNORMAL HIGH (ref 70–99)
Potassium: 3.5 mmol/L (ref 3.5–5.1)
Sodium: 138 mmol/L (ref 135–145)
Total Bilirubin: 0.7 mg/dL (ref 0.3–1.2)
Total Protein: 6.7 g/dL (ref 6.5–8.1)

## 2021-09-12 LAB — CBG MONITORING, ED: Glucose-Capillary: 156 mg/dL — ABNORMAL HIGH (ref 70–99)

## 2021-09-12 LAB — CBC
HCT: 40.7 % (ref 39.0–52.0)
Hemoglobin: 13.7 g/dL (ref 13.0–17.0)
MCH: 30.4 pg (ref 26.0–34.0)
MCHC: 33.7 g/dL (ref 30.0–36.0)
MCV: 90.2 fL (ref 80.0–100.0)
Platelets: 228 10*3/uL (ref 150–400)
RBC: 4.51 MIL/uL (ref 4.22–5.81)
RDW: 14.1 % (ref 11.5–15.5)
WBC: 7.3 10*3/uL (ref 4.0–10.5)
nRBC: 0 % (ref 0.0–0.2)

## 2021-09-12 LAB — DIFFERENTIAL
Abs Immature Granulocytes: 0.02 10*3/uL (ref 0.00–0.07)
Basophils Absolute: 0.1 10*3/uL (ref 0.0–0.1)
Basophils Relative: 1 %
Eosinophils Absolute: 0.2 10*3/uL (ref 0.0–0.5)
Eosinophils Relative: 2 %
Immature Granulocytes: 0 %
Lymphocytes Relative: 42 %
Lymphs Abs: 3 10*3/uL (ref 0.7–4.0)
Monocytes Absolute: 0.8 10*3/uL (ref 0.1–1.0)
Monocytes Relative: 10 %
Neutro Abs: 3.3 10*3/uL (ref 1.7–7.7)
Neutrophils Relative %: 45 %

## 2021-09-12 LAB — RESP PANEL BY RT-PCR (FLU A&B, COVID) ARPGX2
Influenza A by PCR: NEGATIVE
Influenza B by PCR: NEGATIVE
SARS Coronavirus 2 by RT PCR: NEGATIVE

## 2021-09-12 LAB — I-STAT CHEM 8, ED
BUN: 12 mg/dL (ref 8–23)
Calcium, Ion: 1.18 mmol/L (ref 1.15–1.40)
Chloride: 100 mmol/L (ref 98–111)
Creatinine, Ser: 1.1 mg/dL (ref 0.61–1.24)
Glucose, Bld: 112 mg/dL — ABNORMAL HIGH (ref 70–99)
HCT: 42 % (ref 39.0–52.0)
Hemoglobin: 14.3 g/dL (ref 13.0–17.0)
Potassium: 3.6 mmol/L (ref 3.5–5.1)
Sodium: 140 mmol/L (ref 135–145)
TCO2: 26 mmol/L (ref 22–32)

## 2021-09-12 LAB — ETHANOL: Alcohol, Ethyl (B): <10 mg/dL (ref <10)

## 2021-09-12 LAB — APTT: aPTT: 27 seconds (ref 24–36)

## 2021-09-12 LAB — PROTIME-INR
INR: 1.1 (ref 0.8–1.2)
Prothrombin Time: 13.6 seconds (ref 11.4–15.2)

## 2021-09-12 MED ORDER — SODIUM CHLORIDE 0.9 % IV BOLUS
500.0000 mL | Freq: Once | INTRAVENOUS | Status: AC
  Administered 2021-09-12: 500 mL via INTRAVENOUS

## 2021-09-12 MED ORDER — MECLIZINE HCL 12.5 MG PO TABS
25.0000 mg | ORAL_TABLET | Freq: Once | ORAL | Status: AC
Start: 2021-09-12 — End: 2021-09-12
  Administered 2021-09-12: 25 mg via ORAL
  Filled 2021-09-12: qty 2

## 2021-09-12 NOTE — Consult Note (Signed)
Code stroke activated at 2031. Pt already in CT.  ? ?Returned from CT at 2040. ? ?Dr Nicoletta Dress on camera for neuro exam at 2044. ? ?NIH 0. mRS 0. LKW 1800.  ?

## 2021-09-12 NOTE — H&P (Signed)
?History and Physical  ? ? ?Patient: Omar Ruiz WSF:[id number] DOB: 08-08-1950 ?DOA: 09/12/2021 ?DOS: the patient was seen and examined on 09/13/2021 ?PCP: [name], NP  ?Patient coming from: Home ? ?Chief Complaint:  ?Chief Complaint  ?Patient presents with  ? Aphasia  ? ?HPI: Omar Ruiz is a 71 y.o. male with medical history significant of CAD, hyperlipidemia, hypertension who presents to the emergency department accompanied by wife due to sudden onset of spinning sensation and slurred speech which started around 7 PM today, he complained of cold chills sensation under his skin and was having difficulty with his speech, patient also signs that the room was spinning around.  Wife gave him some water and juice at home without any improvement in symptoms.  EMS was activated and patient was taken to the ED for further evaluation and management. ? ? ?ED Course: ?In the emergency department, he was bradycardic, BP was soft at 104/65.  Other vital signs were within normal range.  Work-up in the ED showed normal CBC and BMP, alcohol level was less than 10, urine drug screen was negative, urinalysis was unimpressive for UTI. ?CT of head without contrast showed no acute intracranial abnormality ?Patient was treated with meclizine, IV hydration was provided.  Telemetry neurologist was consulted and recommended further stroke work-up.  Hospitalist was asked to admit patient for further evaluation and management. ? ? ?Past Medical History:  ?Diagnosis Date  ? Arthritis   ? Asthma   ? CAD (coronary artery disease)   ? a. s/p DES to LAD in 2011 with low-risk NST in 02/2017  ? Cancer [facility])   ? skin cancer on hand, cancer removed  ? Chest pain 07/2004  ? 2D Echo EF>55%  ? COPD (chronic obstructive pulmonary disease) ([place])   ? COPD, mild ([place])   ? pt states he had study done and he does not have this anymore  ? Hyperlipemia   ? Hypertension 10/2004  ? stress test EF 57%  ? Obesity   ? Palpitation   ? Sleep apnea   ?  CPAP 4.5 pressure setting  ? ?Past Surgical History:  ?Procedure Laterality Date  ? COLONOSCOPY  10/2004  ? COLONOSCOPY N/A 03/29/2013  ? Procedure: COLONOSCOPY;  Surgeon: [name], MD;  [facility];  Service: Gastroenterology;  Laterality: N/A;  ? MOUTH SURGERY    ? NASAL SINUS SURGERY    ? SKIN CANCER EXCISION    ? right hand   ? STENTS  09/05/2009  ? 3.0x5m promus drug eluting stent for a 90% mid LAD artery stenosis  ? TOTAL KNEE ARTHROPLASTY Right 08/27/2020  ? Procedure: TOTAL KNEE ARTHROPLASTY;  Surgeon: [name] MD;  [facility];  Service: Orthopedics;  Laterality: Right;  552m  ? TOTAL SHOULDER ARTHROPLASTY Right 12/24/2017  ? Procedure: RIGHT TOTAL SHOULDER ARTHROPLASTY;  Surgeon: ChTania AdeMD;  [facility] Service: Orthopedics;  Laterality: Right;  ? ? ?Social History:  reports that he quit smoking about 40 years ago. He has never used smokeless tobacco. He reports current alcohol use of about 12.0 standard drinks per week. He reports that he does not use drugs. ? ? ?No Known Allergies ? ?Family History  ?Problem Relation Age of Onset  ? Diabetes Mother   ? Heart disease Father   ? Lung disease Other   ? Asthma Other   ? Diabetes Other   ? CAD Brother   ?     3 brothers hx  of cad  ?  ? ?Prior to Admission medications   ?Medication Sig Start Date End Date Taking? Authorizing Provider  ?acetaminophen (TYLENOL) 500 MG tablet Take 1,000 mg by mouth daily as needed for moderate pain or mild pain.    [provider]  ?amoxicillin (AMOXIL) 500 MG capsule Take 2,000 mg by mouth once. 06/23/19   [provider]  ?gabapentin (NEURONTIN) 300 MG capsule Take a 300 mg capsule three times a day for two weeks following surgery.Then take a 300 mg capsule two times a day for two weeks. Then take a 300 mg capsule once a day for two weeks. Then discontinue. 08/28/20   Edmisten, Ok Anis, PA  ?methocarbamol (ROBAXIN) 500 MG tablet Take 1 tablet (500 mg total) by  mouth every 6 (six) hours as needed for muscle spasms. 08/28/20   Edmisten, Ok Anis, PA  ?metoprolol succinate (TOPROL-XL) 25 MG 24 hr tablet Take 1 tablet (25 mg total) by mouth daily. 11/09/20   Strader, Fransisco Hertz, PA-C  ?nitroGLYCERIN (NITROSTAT) 0.4 MG SL tablet Place 1 tablet (0.4 mg total) under the tongue every 5 (five) minutes as needed for chest pain. 12/09/19   Strader, Fransisco Hertz, PA-C  ?oxyCODONE (OXY IR/ROXICODONE) 5 MG immediate release tablet Take 1-2 tablets (5-10 mg total) by mouth every 6 (six) hours as needed for severe pain. 08/28/20   Edmisten, Ok Anis, PA  ?simvastatin (ZOCOR) 40 MG tablet Take 40 mg by mouth in the morning at 0400 ?Patient taking differently: Take 40 mg by mouth daily. 0400 12/09/19   Strader, Fransisco Hertz, PA-C  ?traMADol (ULTRAM) 50 MG tablet Take 1-2 tablets (50-100 mg total) by mouth every 6 (six) hours as needed for moderate pain. 08/28/20   Edmisten, Ok Anis, PA  ? ? ?Physical Exam: ?BP 96/64   Pulse (!) 56   Resp 13   Wt 115.3 kg   SpO2 94%   BMI 34.46 kg/m?  ? ?General: 71 y.o. year-old male well developed well nourished in no acute distress.  Alert and oriented x3. ?HEENT: NCAT, EOMI ?Neck: Supple, trachea medial ?Cardiovascular: Regular rate and rhythm with no rubs or gallops.  No thyromegaly or JVD noted.  No lower extremity edema. 2/4 pulses in all 4 extremities. ?Respiratory: Clear to auscultation with no wheezes or rales. Good inspiratory effort. ?Abdomen: Soft, nontender nondistended with normal bowel sounds x4 quadrants. ?Muskuloskeletal: No cyanosis, clubbing or edema noted bilaterally ?Neuro: CN II-XII intact, strength 5/5 x 4, sensation, reflexes intact ?Skin: No ulcerative lesions noted or rashes ?Psychiatry: Judgement and insight appear normal. Mood is appropriate for condition and setting ?   ?   ?   ?Labs on Admission:  ?Basic Metabolic Panel: ?Recent Labs  ?Lab 09/12/21 ?2038 09/12/21 ?2041  ?NA 138 140  ?K 3.5 3.6  ?CL 104 100  ?CO2 27  --    ?GLUCOSE 115* 112*  ?BUN 14 12  ?CREATININE 1.13 1.10  ?CALCIUM 8.8*  --   ? ?Liver Function Tests: ?Recent Labs  ?Lab 09/12/21 ?2038  ?AST 21  ?ALT 21  ?ALKPHOS 59  ?BILITOT 0.7  ?PROT 6.7  ?ALBUMIN 3.8  ? ?No results for input(s): LIPASE, AMYLASE in the last 168 hours. ?No results for input(s): AMMONIA in the last 168 hours. ?CBC: ?Recent Labs  ?Lab 09/12/21 ?2038 09/12/21 ?2041 09/13/21 ?0356  ?WBC 7.3  --  5.5  ?NEUTROABS 3.3  --   --   ?HGB 13.7 14.3 12.7*  ?HCT 40.7 42.0 38.4*  ?MCV 90.2  --  91.2  ?PLT 228  --  211  ? ?Cardiac Enzymes: ?No results for input(s): CKTOTAL, CKMB, CKMBINDEX, TROPONINI in the last 168 hours. ? ?BNP (last 3 results) ?No results for input(s): BNP in the last 8760 hours. ? ?ProBNP (last 3 results) ?No results for input(s): PROBNP in the last 8760 hours. ? ?CBG: ?Recent Labs  ?Lab 09/12/21 ?2027  ?GLUCAP 156*  ? ? ?Radiological Exams on Admission: ?CT HEAD CODE STROKE WO CONTRAST ? ?Result Date: 09/12/2021 ?CLINICAL DATA:  Code stroke.  Speech changes EXAM: CT HEAD WITHOUT CONTRAST TECHNIQUE: Contiguous axial images were obtained from the base of the skull through the vertex without intravenous contrast. RADIATION DOSE REDUCTION: This exam was performed according to the departmental dose-optimization program which includes automated exposure control, adjustment of the mA and/or kV according to patient size and/or use of iterative reconstruction technique. COMPARISON:  None Available. FINDINGS: Brain: There is no mass, hemorrhage or extra-axial collection. The size and configuration of the ventricles and extra-axial CSF spaces are normal. The brain parenchyma is normal, without evidence of acute or chronic infarction. Vascular: No abnormal hyperdensity of the major intracranial arteries or dural venous sinuses. No intracranial atherosclerosis. Skull: The visualized skull base, calvarium and extracranial soft tissues are normal. Sinuses/Orbits: No fluid levels or advanced mucosal  thickening of the visualized paranasal sinuses. No mastoid or middle ear effusion. The orbits are normal. ASPECTS [facility] Stroke Program Early CT Score) - Ganglionic level infarction (caudate, lentiform nuclei, internal cap

## 2021-09-12 NOTE — ED Triage Notes (Signed)
Pt reports dizziness and slurred speech that started around 7pm tonight.  Pt was cutting grass today.  Pt is alert and answers questions appropriately.  Able to more all extremities.  Resp even and unlabored.  Skin warm and dry.   ?

## 2021-09-12 NOTE — Consult Note (Signed)
[facility] ?[facility] Tele[facility]urology Consult Services ? ? ?Patient Name:   Omar Ruiz, Omar Ruiz ?Date of Birth:   1950-10-22 ?Date of Service:   09/12/2021 20:40:43 ? ?Diagnosis: ?      R42 - Dizziness/ Vertigo/ Giddiness ? ?Impression: ?     71 year old male, history of hypertension and hyperlipidemia, who is here with acute onset room spinning sensation and transient slurred speech. NIH Stroke Scale is 0 at this time. Patient's exam is entirely nonfocal. He is communicating clearly. Head CT is negative for acute abnormalities. Etiology is most consistent with a peripheral vertigo but he should be admitted for a formal stroke workup. ?  ? PLAN ? - MRI brain and MRA head w/o contrast ? - MRA neck w/ and w/o contrast ? - TTE w/bubble ? - check a1c and LDL ? - monitor on tele for afib ? - neuro to follow ? ?-- ? ?Our recommendations are outlined below. ? ?Recommendations: ? ?      Stroke/Telemetry Floor ?      Neuro Checks ?      Bedside Swallow Eval ?      DVT Prophylaxis ?      IV Fluids, Normal Saline ?      Head of Bed 30 Degrees ?      Euglycemia and Avoid Hyperthermia (PRN Acetaminophen) ? ? ?Sign Out: ?      Discussed with Emergency Department Provider ? ? ? ?------------------------------------------------------------------------------ ? ?Advanced Imaging: ?Advanced Imaging Deferred because: ? ?not suggestive of LVO ? ? ?Metrics: ?Last Known Well: 09/12/2021 18:00:00 ?[facility] Notification Time: 09/12/2021 20:40:43 ?Arrival Time: 09/12/2021 20:21:00 ?Stamp Time: 09/12/2021 20:40:43 ?Initial Response Time: 09/12/2021 20:43:43 ?Symptoms: dizziness. ?NIHSS Start Assessment Time: 09/12/2021 20:46:55 ?Patient is not a candidate for Thrombolytic. ?Thrombolytic Medical Decision: 09/12/2021 20:55:09 ?Patient was not deemed candidate for Thrombolytic because of following reasons: ?Resolved symptoms (no residual disabling symptoms). ?No disabling symptoms. ? ?CT head showed no acute hemorrhage or acute core  infarct. ? ?Primary Provider Notified of Diagnostic Impression and Management Plan on: 09/12/2021 21:06:47 ? ? ?------------------------------------------------------------------------------ ? ?History of Present Illness: ?Patient is a 71 year old Male. ? ?Patient was brought by private transportation with symptoms of dizziness. ?71 year old male, history of hypertension, hyperlipidemia, who was last known well at 6:00 p.m. He was out on a walk with his wife and their dog when he developed acute onset dizziness, lightheadedness, and room spinning sensation. He had trouble walking back to the house. His wife also noted some possible slurred speech. By the time he came to the ER his symptoms had resolved. He is still reporting some mild lightheadedness in bed but the room spinning sensation is gone and he is speaking clearly. No history of stroke in the past. ? ? ?Past Medical History: ?     Hypertension ?     Hyperlipidemia ? ?No Anticoagulant use  ?Antiplatelet use: Yes asa ?Reviewed EMR for current medications ? ?Allergies:  ?Reviewed ? ?Social History: ?Drug Use: No ? ?Family History: ?There is no family history of premature cerebrovascular disease pertinent to this consultation ? ?ROS : ?14 Points Review of Systems was performed and was negative except mentioned in HPI. ? ?Past Surgical History: ?There Is No Surgical History Contributory To Today?s Visit ? ? ? ?Examination: ?BP(132/73), Pulse(55), Blood Glucose(156) ?1A: Level of Consciousness - Alert; keenly responsive + 0 ?1B: Ask Month and Age - Both Questions Right + 0 ?1C: Blink Eyes & Squeeze Hands - Performs Both Tasks + 0 ?2: Test Horizontal  Extraocular Movements - Normal + 0 ?3: Test Visual Fields - No Visual Loss + 0 ?4: Test Facial Palsy (Use Grimace if Obtunded) - Normal symmetry + 0 ?5A: Test Left Arm Motor Drift - No Drift for 10 Seconds + 0 ?5B: Test Right Arm Motor Drift - No Drift for 10 Seconds + 0 ?6A: Test Left Leg Motor Drift - No Drift for 5  Seconds + 0 ?6B: Test Right Leg Motor Drift - No Drift for 5 Seconds + 0 ?7: Test Limb Ataxia (FNF/Heel-Shin) - No Ataxia + 0 ?8: Test Sensation - Normal; No sensory loss + 0 ?9: Test Language/Aphasia - Normal; No aphasia + 0 ?10: Test Dysarthria - Normal + 0 ?11: Test Extinction/Inattention - No abnormality + 0 ? ?NIHSS Score: 0 ? ? ?Pre-Morbid Modified Rankin Scale: ?0 Points = No symptoms at all ? ? ?Patient/Family was informed the Neurology Consult would occur via TeleHealth consult by way of interactive audio and video telecommunications and consented to receiving care in this manner. ? ? ?Patient is being evaluated for possible acute neurologic impairment and high probability of imminent or life-threatening deterioration. I spent total of 20 minutes providing care to this patient, including time for face to face visit via telemedicine, review of medical records, imaging studies and discussion of findings with providers, the patient and/or family. ? ? ?Dr Burtis Junes ? ? ?[facility] ?[id number] ? ? ?Case [id number] ? ?

## 2021-09-12 NOTE — ED Provider Notes (Signed)
?[place] ?Provider Note ? ? ?CSN: [id number] ?Arrival date & time: 09/12/21  2021 ? ?  ? ?History ? ?Chief Complaint  ?Patient presents with  ? Aphasia  ? ? ?Omar Ruiz is a 71 y.o. male. ? ?HPI ?Patient presents with acute dizziness.  Began around 7:00 tonight.  Had been mowing during the day today.  States he feels tingly all over.  States he feels that something is crawling under his skin, has been having some difficulty speaking reportedly.  States he feels that the room was spinning around.  No headache.  No confusion.  States his mouth feels dry.  Is found to be somewhat hypotensive upon arrival.   ?  ?Past Medical History:  ?Diagnosis Date  ? Arthritis   ? Asthma   ? CAD (coronary artery disease)   ? a. s/p DES to LAD in 2011 with low-risk NST in 02/2017  ? Cancer [facility])   ? skin cancer on hand, cancer removed  ? Chest pain 07/2004  ? 2D Echo EF>55%  ? COPD (chronic obstructive pulmonary disease) ([place])   ? COPD, mild ([place])   ? pt states he had study done and he does not have this anymore  ? Hyperlipemia   ? Hypertension 10/2004  ? stress test EF 57%  ? Obesity   ? Palpitation   ? Sleep apnea   ? CPAP 4.5 pressure setting  ? ? ?Home Medications ?Prior to Admission medications   ?Medication Sig Start Date End Date Taking? Authorizing Provider  ?acetaminophen (TYLENOL) 500 MG tablet Take 1,000 mg by mouth daily as needed for moderate pain or mild pain.    [provider]  ?amoxicillin (AMOXIL) 500 MG capsule Take 2,000 mg by mouth once. 06/23/19   [provider]  ?gabapentin (NEURONTIN) 300 MG capsule Take a 300 mg capsule three times a day for two weeks following surgery.Then take a 300 mg capsule two times a day for two weeks. Then take a 300 mg capsule once a day for two weeks. Then discontinue. 08/28/20   Edmisten, Ok Anis, PA  ?methocarbamol (ROBAXIN) 500 MG tablet Take 1 tablet (500 mg total) by mouth every 6 (six) hours as needed for muscle spasms. 08/28/20    Edmisten, Ok Anis, PA  ?metoprolol succinate (TOPROL-XL) 25 MG 24 hr tablet Take 1 tablet (25 mg total) by mouth daily. 11/09/20   Strader, Fransisco Hertz, PA-C  ?nitroGLYCERIN (NITROSTAT) 0.4 MG SL tablet Place 1 tablet (0.4 mg total) under the tongue every 5 (five) minutes as needed for chest pain. 12/09/19   Strader, Fransisco Hertz, PA-C  ?oxyCODONE (OXY IR/ROXICODONE) 5 MG immediate release tablet Take 1-2 tablets (5-10 mg total) by mouth every 6 (six) hours as needed for severe pain. 08/28/20   Edmisten, Ok Anis, PA  ?simvastatin (ZOCOR) 40 MG tablet Take 40 mg by mouth in the morning at 0400 ?Patient taking differently: Take 40 mg by mouth daily. 0400 12/09/19   Strader, Fransisco Hertz, PA-C  ?traMADol (ULTRAM) 50 MG tablet Take 1-2 tablets (50-100 mg total) by mouth every 6 (six) hours as needed for moderate pain. 08/28/20   Edmisten, Ok Anis, PA  ?   ? ?Allergies    ?Patient has no known allergies.   ? ?Review of Systems   ?Review of Systems  ?HENT:  Negative for congestion.   ?Gastrointestinal:  Positive for nausea. Negative for abdominal pain.  ?Musculoskeletal:  Negative for back pain.  ?Neurological:  Positive for dizziness.  Negative for headaches.  ? ?Physical Exam ?Updated Vital Signs ?BP 104/65   Pulse (!) 58   Resp 15   Wt 115.3 kg   SpO2 92%   BMI 34.46 kg/m?  ?Physical Exam ?Vitals and nursing note reviewed.  ?HENT:  ?   Head: Atraumatic.  ?   Right Ear: Tympanic membrane normal.  ?   Left Ear: Tympanic membrane normal.  ?   Mouth/Throat:  ?   Mouth: Mucous membranes are dry.  ?Eyes:  ?   Pupils: Pupils are equal, round, and reactive to light.  ?   Comments: Sitting in chair with eyes closed.  With opening eyes does appear to have equal pupils.  Does have some nystagmus horizontally.  Able to tract and eye movements appear grossly intact.  ?Cardiovascular:  ?   Rate and Rhythm: Normal rate.  ?Pulmonary:  ?   Breath sounds: No wheezing or rhonchi.  ?Abdominal:  ?   Tenderness: There is no abdominal  tenderness.  ?Musculoskeletal:  ?   Cervical back: Neck supple.  ?Skin: ?   General: Skin is warm.  ?Neurological:  ?   Mental Status: He is alert and oriented to person, place, and time.  ?   Comments: Eyes closed but will open with asking.  Eye movements intact but does have nystagmus horizontally.  Has nausea.  Awake and appropriate.  Normal speech.  Finger-nose appears slightly off on the right with left appearing normal.  ? ? ?ED Results / Procedures / Treatments   ?Labs ?(all labs ordered are listed, but only abnormal results are displayed) ?Labs Reviewed  ?COMPREHENSIVE METABOLIC PANEL - Abnormal; Notable for the following components:  ?    Result Value  ? Glucose, Bld 115 (*)   ? Calcium 8.8 (*)   ? All other components within normal limits  ?I-STAT CHEM 8, ED - Abnormal; Notable for the following components:  ? Glucose, Bld 112 (*)   ? All other components within normal limits  ?CBG MONITORING, ED - Abnormal; Notable for the following components:  ? Glucose-Capillary 156 (*)   ? All other components within normal limits  ?RESP PANEL BY RT-PCR (FLU A&B, COVID) ARPGX2  ?ETHANOL  ?PROTIME-INR  ?APTT  ?CBC  ?DIFFERENTIAL  ?RAPID URINE DRUG SCREEN, HOSP PERFORMED  ?URINALYSIS, ROUTINE W REFLEX MICROSCOPIC  ? ? ?EKG ?None ? ?Radiology ?CT HEAD CODE STROKE WO CONTRAST ? ?Result Date: 09/12/2021 ?CLINICAL DATA:  Code stroke.  Speech changes EXAM: CT HEAD WITHOUT CONTRAST TECHNIQUE: Contiguous axial images were obtained from the base of the skull through the vertex without intravenous contrast. RADIATION DOSE REDUCTION: This exam was performed according to the departmental dose-optimization program which includes automated exposure control, adjustment of the mA and/or kV according to patient size and/or use of iterative reconstruction technique. COMPARISON:  None Available. FINDINGS: Brain: There is no mass, hemorrhage or extra-axial collection. The size and configuration of the ventricles and extra-axial CSF spaces  are normal. The brain parenchyma is normal, without evidence of acute or chronic infarction. Vascular: No abnormal hyperdensity of the major intracranial arteries or dural venous sinuses. No intracranial atherosclerosis. Skull: The visualized skull base, calvarium and extracranial soft tissues are normal. Sinuses/Orbits: No fluid levels or advanced mucosal thickening of the visualized paranasal sinuses. No mastoid or middle ear effusion. The orbits are normal. ASPECTS [facility] Stroke Program Early CT Score) - Ganglionic level infarction (caudate, lentiform nuclei, internal capsule, insula, M1-M3 cortex): 7 - Supraganglionic infarction (M4-M6 cortex): 3 Total score (  0-10 with 10 being normal): 10 IMPRESSION: 1. No acute intracranial abnormality. 2. ASPECTS is 10. These results were called by telephone at the time of interpretation on 09/12/2021 at 8:48 pm to provider Davonna Belling , who verbally acknowledged these results. Electronically Signed   By: Ulyses Jarred M.D.   On: 09/12/2021 20:48   ? ?Procedures ?Procedures  ? ? ?Medications Ordered in ED ?Medications  ?sodium chloride 0.9 % bolus 500 mL (500 mLs Intravenous New Bag/Given 09/12/21 2048)  ?meclizine (ANTIVERT) tablet 25 mg (25 mg Oral Given 09/12/21 2109)  ? ? ?ED Course/ Medical Decision Making/ A&P ?  ?                        ?Medical Decision Making ?Amount and/or Complexity of Data Reviewed ?Labs: ordered. ?Radiology: ordered. ? ? ?Patient presented with cute onset of a vertigo.  Rather severe.  Reportedly it had some difficulty speaking.  For me finger-nose may have been a little off on the right.  Stated also that he felt tingling all over his skin.  Code stroke been called.  Initial CT reassuring and independently interpreted by me and also reviewed neurology and radiology interpretation.  Discussed with teleneurologist.  Thinks most likely it is a peripheral cause, however thinks patient needs admission for monitoring and MRI tomorrow.  Initially  hypotensive with pressures been in the 90s.  Improved with some IV fluids.  Does feel somewhat better.  Bilateral TMs are reassuring.  Will discuss with hospitalist for admission ? ?CRITICAL CARE ?Performed by: Ovid Curd

## 2021-09-12 NOTE — ED Notes (Signed)
Pt provided with blanket and lights dimmed.  ?

## 2021-09-13 ENCOUNTER — Observation Stay (HOSPITAL_COMMUNITY): Payer: Medicare PPO

## 2021-09-13 ENCOUNTER — Observation Stay (HOSPITAL_BASED_OUTPATIENT_CLINIC_OR_DEPARTMENT_OTHER): Payer: Medicare PPO

## 2021-09-13 DIAGNOSIS — R0609 Other forms of dyspnea: Secondary | ICD-10-CM | POA: Diagnosis not present

## 2021-09-13 DIAGNOSIS — G459 Transient cerebral ischemic attack, unspecified: Secondary | ICD-10-CM

## 2021-09-13 DIAGNOSIS — R29818 Other symptoms and signs involving the nervous system: Secondary | ICD-10-CM

## 2021-09-13 DIAGNOSIS — R001 Bradycardia, unspecified: Secondary | ICD-10-CM

## 2021-09-13 DIAGNOSIS — E782 Mixed hyperlipidemia: Secondary | ICD-10-CM | POA: Diagnosis not present

## 2021-09-13 DIAGNOSIS — R4781 Slurred speech: Secondary | ICD-10-CM

## 2021-09-13 DIAGNOSIS — I10 Essential (primary) hypertension: Secondary | ICD-10-CM | POA: Diagnosis not present

## 2021-09-13 DIAGNOSIS — R42 Dizziness and giddiness: Secondary | ICD-10-CM | POA: Diagnosis not present

## 2021-09-13 DIAGNOSIS — R479 Unspecified speech disturbances: Secondary | ICD-10-CM

## 2021-09-13 LAB — URINALYSIS, ROUTINE W REFLEX MICROSCOPIC
Bilirubin Urine: NEGATIVE
Glucose, UA: NEGATIVE mg/dL
Hgb urine dipstick: NEGATIVE
Ketones, ur: NEGATIVE mg/dL
Leukocytes,Ua: NEGATIVE
Nitrite: NEGATIVE
Protein, ur: NEGATIVE mg/dL
Specific Gravity, Urine: 1.003 — ABNORMAL LOW (ref 1.005–1.030)
pH: 7 (ref 5.0–8.0)

## 2021-09-13 LAB — COMPREHENSIVE METABOLIC PANEL
ALT: 19 U/L (ref 0–44)
AST: 18 U/L (ref 15–41)
Albumin: 3.4 g/dL — ABNORMAL LOW (ref 3.5–5.0)
Alkaline Phosphatase: 53 U/L (ref 38–126)
Anion gap: 4 — ABNORMAL LOW (ref 5–15)
BUN: 12 mg/dL (ref 8–23)
CO2: 28 mmol/L (ref 22–32)
Calcium: 8.8 mg/dL — ABNORMAL LOW (ref 8.9–10.3)
Chloride: 108 mmol/L (ref 98–111)
Creatinine, Ser: 0.95 mg/dL (ref 0.61–1.24)
GFR, Estimated: >60 mL/min (ref >60)
Glucose, Bld: 114 mg/dL — ABNORMAL HIGH (ref 70–99)
Potassium: 3.5 mmol/L (ref 3.5–5.1)
Sodium: 140 mmol/L (ref 135–145)
Total Bilirubin: 0.7 mg/dL (ref 0.3–1.2)
Total Protein: 5.9 g/dL — ABNORMAL LOW (ref 6.5–8.1)

## 2021-09-13 LAB — PHOSPHORUS: Phosphorus: 3.7 mg/dL (ref 2.5–4.6)

## 2021-09-13 LAB — ECHOCARDIOGRAM COMPLETE BUBBLE STUDY
AR max vel: 2.67 cm2
AV Area VTI: 2.76 cm2
AV Area mean vel: 2.73 cm2
AV Mean grad: 2 mmHg
AV Peak grad: 4.3 mmHg
Ao pk vel: 1.04 m/s
Area-P 1/2: 2.61 cm2
Calc EF: 62.7 %
MV VTI: 2.5 cm2
S' Lateral: 3.4 cm
Single Plane A2C EF: 59.2 %
Single Plane A4C EF: 65.6 %

## 2021-09-13 LAB — FOLATE: Folate: 23.7 ng/mL (ref >5.9)

## 2021-09-13 LAB — CBC
HCT: 38.4 % — ABNORMAL LOW (ref 39.0–52.0)
Hemoglobin: 12.7 g/dL — ABNORMAL LOW (ref 13.0–17.0)
MCH: 30.2 pg (ref 26.0–34.0)
MCHC: 33.1 g/dL (ref 30.0–36.0)
MCV: 91.2 fL (ref 80.0–100.0)
Platelets: 211 10*3/uL (ref 150–400)
RBC: 4.21 MIL/uL — ABNORMAL LOW (ref 4.22–5.81)
RDW: 14.3 % (ref 11.5–15.5)
WBC: 5.5 10*3/uL (ref 4.0–10.5)
nRBC: 0 % (ref 0.0–0.2)

## 2021-09-13 LAB — RAPID URINE DRUG SCREEN, HOSP PERFORMED
Amphetamines: NOT DETECTED
Barbiturates: NOT DETECTED
Benzodiazepines: NOT DETECTED
Cocaine: NOT DETECTED
Opiates: NOT DETECTED
Tetrahydrocannabinol: NOT DETECTED

## 2021-09-13 LAB — LIPID PANEL
Cholesterol: 146 mg/dL (ref 0–200)
HDL: 42 mg/dL (ref >40)
LDL Cholesterol: 84 mg/dL (ref 0–99)
Total CHOL/HDL Ratio: 3.5 RATIO
Triglycerides: 98 mg/dL (ref <150)
VLDL: 20 mg/dL (ref 0–40)

## 2021-09-13 LAB — TSH: TSH: 3.824 u[IU]/mL (ref 0.350–4.500)

## 2021-09-13 LAB — HEMOGLOBIN A1C
Hgb A1c MFr Bld: 5.8 % — ABNORMAL HIGH (ref 4.8–5.6)
Mean Plasma Glucose: 119.76 mg/dL

## 2021-09-13 LAB — CK: Total CK: 122 U/L (ref 49–397)

## 2021-09-13 LAB — VITAMIN B12: Vitamin B-12: 344 pg/mL (ref 180–914)

## 2021-09-13 LAB — HIV ANTIBODY (ROUTINE TESTING W REFLEX): HIV Screen 4th Generation wRfx: NONREACTIVE

## 2021-09-13 LAB — T4, FREE: Free T4: 0.77 ng/dL (ref 0.61–1.12)

## 2021-09-13 LAB — MAGNESIUM: Magnesium: 2.1 mg/dL (ref 1.7–2.4)

## 2021-09-13 MED ORDER — GADOBUTROL 1 MMOL/ML IV SOLN
10.0000 mL | Freq: Once | INTRAVENOUS | Status: AC | PRN
  Administered 2021-09-13: 10 mL via INTRAVENOUS

## 2021-09-13 MED ORDER — POTASSIUM CHLORIDE IN NACL 20-0.9 MEQ/L-% IV SOLN
INTRAVENOUS | Status: DC
  Filled 2021-09-13: qty 1000

## 2021-09-13 MED ORDER — ATORVASTATIN CALCIUM 40 MG PO TABS
40.0000 mg | ORAL_TABLET | Freq: Every day | ORAL | Status: DC
  Administered 2021-09-13: 40 mg via ORAL
  Filled 2021-09-13: qty 1

## 2021-09-13 MED ORDER — CLOPIDOGREL BISULFATE 75 MG PO TABS: 75.0000 mg | ORAL_TABLET | Freq: Every day | ORAL | Status: DC

## 2021-09-13 MED ORDER — MECLIZINE HCL 12.5 MG PO TABS: 25.0000 mg | ORAL_TABLET | Freq: Three times a day (TID) | ORAL | Status: DC | PRN

## 2021-09-13 MED ORDER — CLOPIDOGREL BISULFATE 75 MG PO TABS: 75.0000 mg | ORAL_TABLET | Freq: Every day | ORAL | 0 refills | Status: DC

## 2021-09-13 MED ORDER — METOPROLOL SUCCINATE ER 25 MG PO TB24: 12.5000 mg | ORAL_TABLET | Freq: Every day | ORAL | Status: DC

## 2021-09-13 MED ORDER — SIMVASTATIN 10 MG PO TABS: 40.0000 mg | ORAL_TABLET | Freq: Every day | ORAL | Status: DC

## 2021-09-13 MED ORDER — CLOPIDOGREL BISULFATE 75 MG PO TABS
75.0000 mg | ORAL_TABLET | Freq: Every day | ORAL | Status: DC
Start: 2021-09-13 — End: 2021-09-13
  Administered 2021-09-13: 75 mg via ORAL
  Filled 2021-09-13: qty 1

## 2021-09-13 MED ORDER — ASPIRIN EC 81 MG PO TBEC
81.0000 mg | DELAYED_RELEASE_TABLET | Freq: Every day | ORAL | Status: DC
  Administered 2021-09-13: 81 mg via ORAL
  Filled 2021-09-13: qty 1

## 2021-09-13 MED ORDER — METOPROLOL SUCCINATE ER 25 MG PO TB24: 12.5000 mg | ORAL_TABLET | Freq: Every day | ORAL | 1 refills | Status: DC

## 2021-09-13 NOTE — ED Notes (Signed)
Echo at bedside

## 2021-09-13 NOTE — Assessment & Plan Note (Signed)
Patient relates dyspnea on exertion for the past 6 months ?-He has no chest discomfort ?May be partly due to the patient bradycardia ?Echocardiogram ?Personally reviewed EKG--sinus rhythm, no ST-T wave changes ?Chest x-ray ?Outpatient cardiology follow-up if echocardiogram is reassuring ?TSH ?

## 2021-09-13 NOTE — TOC Progression Note (Signed)
?  Transition of Care (TOC) Screening Note ? ? ?Patient Details  ?Name: Omar Ruiz ?Date of Birth: Oct 20, 1950 ? ? ?Transition of Care (TOC) CM/SW Contact:    ?Shade Flood, LCSW ?Phone Number: ?09/13/2021, 8:07 AM ? ? ? ?Transition of Care Department [facility]) has reviewed patient and no TOC needs have been identified at this time. We will continue to monitor patient advancement through interdisciplinary progression rounds. If new patient transition needs arise, please place a TOC consult. ? ? ?

## 2021-09-13 NOTE — Assessment & Plan Note (Addendum)
Holding metoprolol temporarily>>HR remains in low 60s  ?-advised pt to cut metoprolol dose in half ?-continue checking BPs at home ?-follow up PCP for adjustment of anti-HTN meds ?

## 2021-09-13 NOTE — Progress Notes (Signed)
SLP Cancellation Note ? ?Patient Details ?Name: Omar Ruiz ?MRN: [id number] ?DOB: 1951/04/28 ? ? ?Cancelled treatment:        Note Pt passed the [facility] stroke swallow screen and a diet has been initiated at this time. ST will sign off, if ST services are indicated please place order. Thank you, ? ?Levita Monical H. Izora Ribas MA, CCC-SLP ?Speech Language Pathologist ? ? ? ?Wende Bushy ?09/13/2021, 8:47 AM ?

## 2021-09-13 NOTE — ED Notes (Signed)
Neurologist- tele ?

## 2021-09-13 NOTE — Progress Notes (Signed)
OT Cancellation Note ? ?Patient Details ?Name: TAYARI YANKEE ?MRN: [id number] ?DOB: [birth year] ? ? ?Cancelled Treatment:    Reason Eval/Treat Not Completed: Patient at procedure or test/ unavailable. Pt at MRI. Will attempt to see later as time permits.  ? ?Michale Emmerich OT, MOT ? ? ?Larey Seat ?09/13/2021, 9:18 AM ?

## 2021-09-13 NOTE — Evaluation (Signed)
Physical Therapy Evaluation ?Patient Details ?Name: Omar Ruiz ?MRN: [id number] ?DOB: 1950/06/17 ?Today's Date: 09/13/2021 ? ?History of Present Illness ? Omar Ruiz is a 71 y.o. male with medical history significant of CAD, hyperlipidemia, hypertension who presents to the emergency department accompanied by wife due to sudden onset of spinning sensation and slurred speech which started around 7 PM today, he complained of cold chills sensation under his skin and was having difficulty with his speech, patient also signs that the room was spinning around.  Wife gave him some water and juice at home without any improvement in symptoms.  EMS was activated and patient was taken to the ED for further evaluation and management ?  ?Clinical Impression ? Patient demonstrated ability to perform bed mobility and transfers independently and in a safe and controlled manner with no increase in sx. Orthostatic vitals for BP were assessed during evaluation in which patient's BP remained in treatment range. Patient is able to ambulate independently with no deficits in gait pattern indicating good return to function. Patient discharged to care of nursing for ambulation daily as tolerated for length of stay.  ?   ? ?Recommendations for follow up therapy are one component of a multi-disciplinary discharge planning process, led by the attending physician.  Recommendations may be updated based on patient status, additional functional criteria and insurance authorization. ? ?Follow Up Recommendations No PT follow up ? ?  ?Assistance Recommended at Discharge PRN  ?Patient can return home with the following ? Help with stairs or ramp for entrance;A little help with walking and/or transfers ? ?  ?Equipment Recommendations None recommended by PT  ?Recommendations for Other Services ?    ?  ?Functional Status Assessment Patient has not had a recent decline in their functional status  ? ?  ?Precautions / Restrictions  Precautions ?Precautions: Fall  ? ?  ? ?Mobility ? Bed Mobility ?Overal bed mobility: Independent ?  ?  ?  ?  ?  ?  ?General bed mobility comments: Patient independent with supine to sit ?  ? ?Transfers ?Overall transfer level: Independent ?Equipment used: None ?  ?  ?  ?  ?  ?  ?  ?General transfer comment: Patient able to perform sit to stand independently and smoothly ?  ? ?Ambulation/Gait ?Ambulation/Gait assistance: Supervision, Modified independent (Device/Increase time) ?Gait Distance (Feet): 75 Feet ?Assistive device: None ?Gait Pattern/deviations: Step-through pattern ?Gait velocity: WNL ?  ?  ?General Gait Details: Patient was able to ambulate independently without any gait pattern deficits observed. ? ?Stairs ?  ?  ?  ?  ?  ? ?Wheelchair Mobility ?  ? ?Modified Rankin (Stroke Patients Only) ?  ? ?  ? ?Balance Overall balance assessment: Independent ?  ?  ?  ?  ?  ?  ?  ?  ?  ?  ?  ?  ?  ?  ?  ?  ?  ?  ?   ? ? ? ?Pertinent Vitals/Pain Pain Assessment ?Pain Assessment: 0-10 ?Pain Score: 0-No pain  ? ? ?Home Living Family/patient expects to be discharged to:: Private residence ?Living Arrangements: Spouse/significant other ?Available Help at Discharge: Family ?Type of Home: House ?Home Access: Stairs to enter ?Entrance Stairs-Rails: Right ?Entrance Stairs-Number of Steps: 3 ?  ?Home Layout: One level ?Home Equipment: Conservation officer, nature (2 wheels);Standard Walker;Wheelchair - manual ?   ?  ?Prior Function Prior Level of Function : Independent/Modified Independent ?  ?  ?  ?  ?  ?  ?  Mobility Comments: Patient reports being able to ambulate at home and in the community independently. Patient was driving previously ?ADLs Comments: Patient reports being independent with ADL's and functional activities ?  ? ? ?Hand Dominance  ?   ? ?  ?Extremity/Trunk Assessment  ? Upper Extremity Assessment ?Upper Extremity Assessment: Overall WFL for tasks assessed ?  ? ?Lower Extremity Assessment ?Lower Extremity Assessment:  Overall WFL for tasks assessed ?  ? ?Cervical / Trunk Assessment ?Cervical / Trunk Assessment: Normal  ?Communication  ? Communication: No difficulties  ?Cognition Arousal/Alertness: Awake/alert ?Behavior During Therapy: [facility] for tasks assessed/performed ?Overall Cognitive Status: Within Functional Limits for tasks assessed ?  ?  ?  ?  ?  ?  ?  ?  ?  ?  ?  ?  ?  ?  ?  ?  ?  ?  ?  ? ?  ?General Comments   ? ?  ?Exercises    ? ?Assessment/Plan  ?  ?PT Assessment Patient does not need any further PT services  ?PT Problem List   ? ?   ?  ?PT Treatment Interventions     ? ?PT Goals (Current goals can be found in the Care Plan section)  ?Acute Rehab PT Goals ?Patient Stated Goal: return home ?PT Goal Formulation: With patient ?Time For Goal Achievement: 09/27/21 ?Potential to Achieve Goals: Good ? ?  ?Frequency   ?  ? ? ?Co-evaluation   ?  ?  ?  ?  ? ? ?  ?AM-PAC PT "6 Clicks" Mobility  ?Outcome Measure Help needed turning from your back to your side while in a flat bed without using bedrails?: None ?Help needed moving from lying on your back to sitting on the side of a flat bed without using bedrails?: None ?Help needed moving to and from a bed to a chair (including a wheelchair)?: None ?Help needed standing up from a chair using your arms (e.g., wheelchair or bedside chair)?: None ?Help needed to walk in hospital room?: None ?Help needed climbing 3-5 steps with a railing? : None ?6 Click Score: 24 ? ?  ?End of Session   ?Activity Tolerance: No increased pain;Patient tolerated treatment well ?Patient left: in bed;with call bell/phone within reach;with family/visitor present ?  ?PT Visit Diagnosis: Unsteadiness on feet (R26.81);Other abnormalities of gait and mobility (R26.89);Muscle weakness (generalized) (M62.81) ?  ? ?Time: 7517-0017 ?PT Time Calculation (min) (ACUTE ONLY): 26 min ? ? ?Charges:   PT Evaluation ?$PT Eval Moderate Complexity: 1 Mod ?PT Treatments ?$Therapeutic Activity: 23-37 mins ?  ?   ? ? ?3:21 PM,  09/13/21 ?Lestine Box, S/PT  ? ?

## 2021-09-13 NOTE — ED Notes (Signed)
Attending at bedside.

## 2021-09-13 NOTE — Assessment & Plan Note (Addendum)
Continue statin ?--restart zocor if pt is willing to take or try different statin in future ?--LDL 84 ?

## 2021-09-13 NOTE — Assessment & Plan Note (Addendum)
-   Neurology Consult ?-PT/OT evaluation ?-Speech therapy eval ?-CT brain--neg ?-MRI brain-- ?-MRA brain-- ?-Carotid Duplex-- ?-Echo-- ?-LDL--84 ?-HbA1C-- ?-Antiplatelet--ASA 81 + plavix ?

## 2021-09-13 NOTE — ED Notes (Signed)
Pt ambulating to bathroom for BM.  ?

## 2021-09-13 NOTE — ED Notes (Signed)
Pt up to bathroom (ambulatory)- BM. Pt now back in bed ?

## 2021-09-13 NOTE — Consult Note (Signed)
I connected with  Omar Ruiz on 09/13/21 by a video enabled telemedicine application and verified that I am speaking with the correct person using two identifiers. ?  ?I discussed the limitations of evaluation and management by telemedicine. The patient expressed understanding and agreed to proceed. ?  ?[facility] ?Location of physician: [facility] ? ?Neurology Consultation ?Reason for Consult: dizziness, slurred speech ?Referring Physician: Dr Shanon Brow Tat ? ?CC: dizziness, slurred speech ? ?History is obtained from: patient. Chart review ? ?HPI: Omar Ruiz is a 71 y.o. male with PMH of HTN, HLD who presented with acute onset dizziness and slurred speech. States he was standing talking to a friend when suddenly felt dizzy as if things are spinning. He walked home and had some water and lemonade. When his wife came, she felt like his speech was slurred so they came to ER. There was also concern for some leaning to ward right side and nausea. ED provider noted horizontal nystagmus. Patient was given meclizine after which he fell asleep. This morning he denies any symptoms.  ? ?Reports having similar symptoms may years ago when he had some issues with his glasses and was told he had vertigo. Takes ASA '81mg'$  daily.  ? ?LKN: 09/12/2021 1800 ?Event happened at home ?No tpa as NIHSS 0 ?No thrombectomy as no LVO ?mRS 0 ?BP on arrival 132/73 ? ?ROS: All other systems reviewed and negative except as noted in the HPI.  ? ?Past Medical History:  ?Diagnosis Date  ? Arthritis   ? Asthma   ? CAD (coronary artery disease)   ? a. s/p DES to LAD in 2011 with low-risk NST in 02/2017  ? Cancer [facility])   ? skin cancer on hand, cancer removed  ? Chest pain 07/2004  ? 2D Echo EF>55%  ? COPD (chronic obstructive pulmonary disease) ([place])   ? COPD, mild ([place])   ? pt states he had study done and he does not have this anymore  ? Hyperlipemia   ? Hypertension 10/2004  ? stress test EF 57%  ? Obesity   ? Palpitation    ? Sleep apnea   ? CPAP 4.5 pressure setting  ? ? ?Family History  ?Problem Relation Age of Onset  ? Diabetes Mother   ? Heart disease Father   ? Lung disease Other   ? Asthma Other   ? Diabetes Other   ? CAD Brother   ?     3 brothers hx of cad  ? ? ?Social History:  reports that he quit smoking about 40 years ago. He has never used smokeless tobacco. He reports current alcohol use of about 12.0 standard drinks per week. He reports that he does not use drugs. ? ? ?Exam: ?Current vital signs: ?BP 107/68   Pulse (!) 55   Temp (!) 97.5 ?F (36.4 ?C) (Oral)   Resp 14   Wt 115.3 kg   SpO2 97%   BMI 34.46 kg/m?  ?Vital signs in last 24 hours: ?Temp:  [97.5 ?F (36.4 ?C)] 97.5 ?F (36.4 ?C) (05/05 2725) ?Pulse Rate:  [46-61] 55 (05/05 1116) ?Resp:  [10-27] 14 (05/05 1116) ?BP: (83-132)/(63-85) 107/68 (05/05 1116) ?SpO2:  [92 %-99 %] 97 % (05/05 1116) ?Weight:  [115.3 kg] 115.3 kg (05/04 2030) ? ? ?Physical Exam  ?Constitutional: Appears well-developed and well-nourished.  ?Psych: Affect appropriate to situation ?Eyes: No scleral injection ?Neuro:Aox3, no aphasia, CN 2-12 grossly intact, antigravity in all extremities without drift, FTN intact, sensory  intact to light touch ?  ?NIHSS 0 ? ? ?I have reviewed labs in epic and the results pertinent to this consultation are: ?CBC:  ?Recent Labs  ?Lab 09/12/21 ?2038 09/12/21 ?2041 09/13/21 ?0356  ?WBC 7.3  --  5.5  ?NEUTROABS 3.3  --   --   ?HGB 13.7 14.3 12.7*  ?HCT 40.7 42.0 38.4*  ?MCV 90.2  --  91.2  ?PLT 228  --  211  ? ? ?Basic Metabolic Panel:  ?Lab Results  ?Component Value Date  ? NA 140 09/13/2021  ? K 3.5 09/13/2021  ? CO2 28 09/13/2021  ? GLUCOSE 114 (H) 09/13/2021  ? BUN 12 09/13/2021  ? CREATININE 0.95 09/13/2021  ? CALCIUM 8.8 (L) 09/13/2021  ? GFRNONAA >60 09/13/2021  ? GFRAA >60 12/25/2017  ? ?Lipid Panel:  ?Lab Results  ?Component Value Date  ? [place] 84 09/13/2021  ? ?HgbA1c:  ?Lab Results  ?Component Value Date  ? HGBA1C 5.8 (H) 09/13/2021  ? ?Urine Drug  Screen:  ?   ?Component Value Date/Time  ? [place] DETECTED 09/13/2021 0138  ? [place] DETECTED 09/13/2021 0138  ? LABBENZ NONE DETECTED 09/13/2021 0138  ? AMPHETMU NONE DETECTED 09/13/2021 0138  ? [place] DETECTED 09/13/2021 0138  ? LABBARB NONE DETECTED 09/13/2021 0138  ?  ?Alcohol Level  ?   ?Component Value Date/Time  ? ETH <10 09/12/2021 2038  ? ? ? ?I have reviewed the images obtained: ?[place] wo contrast 09/12/2021: No acute intracranial abnormality. ? ?MR Brain wo contrast 09/13/2021:  Normal noncontrast brain MRI.  No acute pathology ? ?MRA head and neck w and wo contrast 09/13/2021: Normal vasculature of the head and neck no hemodynamically stenosis or occlusion. ? ?US carotid 09/13/2021: Minimal atherosclerotic plaque of the bilateral carotid bulbs results in less than 50% stenosis by Doppler criteria. Bilateral vertebral arteries demonstrate normal antegrade flow. ? ?TTE 09/13/2021: pending ?  ? ?ASSESSMENT/PLAN: 71 y.o. male with PMH of HTN, HLD who presented with acute onset dizziness and slurred speech. ? ?Transient neurological deficits ?- ddx include TIA vs peripheral vertigo. Normal to low BP on arrival, no other neurological symptoms like vision change, ataxia make it more likely to be peripheral vertigo ? ?Recommendations: ?- ASA '81mg'$  daily +Plavix '75mg'$  daily for 3 weeks followed by ASA '81mg'$  daily ?- No statin as LDL normal ?- Goal BP: normotension ?- If TTE normal, can be discharged from neuro standpoint. ?- Stroke education provided including BEFAST ?- F/u with neuro in 3 months ?- Discussed with Dr Tat ? ? ?Thank you for allowing [place] to participate in the care of this patient. If you have any further questions, please contact  me or neurohospitalist.  ? ?Zeb Comfort ?Epilepsy ?[facility] neurohospitalist ? ? ? ? ?

## 2021-09-13 NOTE — Progress Notes (Signed)
?  ?       ?PROGRESS NOTE ? ?Omar Ruiz:[id number] DOB: 1951-04-03 DOA: 09/12/2021 ?PCP: [name], NP ? ?Brief History:  ?71 year old male with a history of coronary artery disease with DES 2011, hypertension, hyperlipidemia, OSA presenting with sudden onset of vertigo and slurred speech.  He also had a mild bitemporal headache at the time.  This occurred around 5:30 PM on 09/12/2021.  The patient states that he was out walking his dog and speaking with a neighbor during the walk when his symptoms began.  He denied any focal extremity weakness, blurry vision, or new onset dysesthesia.  He walked back home to drink some water and juice without any improvement.  EMS was activated. ?The patient states that his only new medication is Zoloft which he has only taken 2 doses.  He denies any new over-the-counter medications or supplements.  He denies any NSAIDs.  The patient states that he does drink a 12 pack of beer per week, but has not drank in about a week.  He denies any fevers, chills, chest pain, coughing, hemoptysis, nausea, vomiting, diarrhea, abd pain.  He denies any hematochezia, melena.  He states his appetite has been good. ?The patient does endorse dyspnea on exertion for the last 6 months without any frank chest discomfort or angina. ?In the ED, the patient was noted to be bradycardic with HR around 55 with soft blood pressures.  Oxygen saturation was 99% on room air.  BMP showed sodium 130, potassium 3.5, serum creatinine 1.13.  LFTs were unremarkable.  WBC 7.3, hemoglobin 13.7, platelets 228,000.  The patient was seen by teleneurology who recommended admission for further work-up for stroke.  ? ? ?Assessment and Plan: ?* Vertigo ?Concerned about TIA ?Doubt peripheral cause as the patient's symptoms have improved in <12 hours ?--neg nystagmus ?--neg head impulse test ?--+Skew deviation ?Certainly, the patient's bradycardia and soft blood pressures may be contributing to his symptoms ?-- Holding  metoprolol temporarily ?-- Review of the medical record shows that the patient's heart rate 65-75 range during his recent physician appointments with SBP in the 120s-130s. ?Check orthostatics ?MRI brain ?-UA  and UDS are neg ?Remain on telemetry ? ?Dyspnea on exertion ?Patient relates dyspnea on exertion for the past 6 months ?-He has no chest discomfort ?May be partly due to the patient bradycardia ?Echocardiogram ?Personally reviewed EKG--sinus rhythm, no ST-T wave changes ?Chest x-ray ?Outpatient cardiology follow-up if echocardiogram is reassuring ?TSH ? ?Speech disturbance ?- Neurology Consult ?-PT/OT evaluation ?-Speech therapy eval ?-CT brain--neg ?-MRI brain-- ?-MRA brain-- ?-Carotid Duplex-- ?-Echo-- ?-LDL--84 ?-HbA1C-- ?-Antiplatelet--ASA 81 + plavix ? ?Sinus bradycardia ?Holding metoprolol temporarily ? ?Mixed hyperlipidemia ?Continue statin ?--intensify to lipitor ?--LDL 84 ? ?Essential hypertension ?Holding metoprolol as the patient has soft blood pressures ?-In the emergency department, systolic blood pressures in the upper 90s and low 100s ?-Gentle fluid hydration ? ?CAD S/P percutaneous coronary angioplasty ?No chest pain presently ?Continue statin ?Continue aspirin ?Echocardiogram ?Personally reviewed EKG--sinus rhythm, no ST-T wave change ? ? ? ?Family Communication:   spouse updated at bedside 5/5 ? ?Consultants:  neurology ? ?Code Status:  FUL ? ?DVT Prophylaxis: [place] Lovenox ? ? ?Procedures: ?As Listed in Progress Note Above ? ?Antibiotics: ?None ? ? ? ? ? ?Subjective: ?Patient denies fevers, chills, headache, chest pain, dyspnea, nausea, vomiting, diarrhea, abdominal pain, dysuria, hematuria, hematochezia, and melena. ?Speech and dizziness are better ? ?Objective: ?Vitals:  ? 09/13/21 0420 09/13/21 0530 09/13/21 0630 09/13/21 0733  ?BP: 96/64 102/66  102/65   ?Pulse: (!) 56 (!) 54 (!) 53   ?Resp: '13 10 16   '$ ?Temp:    (!) 97.5 ?F (36.4 ?C)  ?TempSrc:    Oral  ?SpO2: 94% 99% 95%   ?Weight:       ? ? ?Intake/Output Summary (Last 24 hours) at 09/13/2021 0748 ?Last data filed at 09/13/2021 0425 ?Gross per 24 hour  ?Intake --  ?Output 750 ml  ?Net -750 ml  ? ?Weight change:  ?Exam: ? ?General:  Pt is alert, follows commands appropriately, not in acute distress ?HEENT: No icterus, No thrush, No neck mass, [place]/AT ?Cardiovascular: RRR, S1/S2, no rubs, no gallops ?Respiratory: CTA bilaterally, no wheezing, no crackles, no rhonchi ?Abdomen: Soft/+BS, non tender, non distended, no guarding ?Extremities: No edema, No lymphangitis, No petechiae, No rashes, no synovitis ?Neuro:  CN II-XII intact, strength 4/5 in RUE, RLE, strength 4/5 LUE, LLE; sensation intact bilateral; no dysmetria; babinski equivocal ? ? ?Data Reviewed: ?I have personally reviewed following labs and imaging studies ?Basic Metabolic Panel: ?Recent Labs  ?Lab 09/12/21 ?2038 09/12/21 ?2041 09/13/21 ?0356  ?NA 138 140 140  ?K 3.5 3.6 3.5  ?CL 104 100 108  ?CO2 27  --  28  ?GLUCOSE 115* 112* 114*  ?BUN '14 12 12  '$ ?CREATININE 1.13 1.10 0.95  ?CALCIUM 8.8*  --  8.8*  ?MG  --   --  2.1  ?PHOS  --   --  3.7  ? ?Liver Function Tests: ?Recent Labs  ?Lab 09/12/21 ?2038 09/13/21 ?0356  ?AST 21 18  ?ALT 21 19  ?ALKPHOS 59 53  ?BILITOT 0.7 0.7  ?PROT 6.7 5.9*  ?ALBUMIN 3.8 3.4*  ? ?No results for input(s): LIPASE, AMYLASE in the last 168 hours. ?No results for input(s): AMMONIA in the last 168 hours. ?Coagulation Profile: ?Recent Labs  ?Lab 09/12/21 ?2038  ?INR 1.1  ? ?CBC: ?Recent Labs  ?Lab 09/12/21 ?2038 09/12/21 ?2041 09/13/21 ?0356  ?WBC 7.3  --  5.5  ?NEUTROABS 3.3  --   --   ?HGB 13.7 14.3 12.7*  ?HCT 40.7 42.0 38.4*  ?MCV 90.2  --  91.2  ?PLT 228  --  211  ? ?Cardiac Enzymes: ?No results for input(s): CKTOTAL, CKMB, CKMBINDEX, TROPONINI in the last 168 hours. ?BNP: ?Invalid input(s): POCBNP ?CBG: ?Recent Labs  ?Lab 09/12/21 ?2027  ?GLUCAP 156*  ? ?HbA1C: ?No results for input(s): HGBA1C in the last 72 hours. ?Urine analysis: ?   ?Component Value Date/Time  ?  COLORURINE STRAW (A) 09/13/2021 0138  ? APPEARANCEUR CLEAR 09/13/2021 0138  ? LABSPEC 1.003 (L) 09/13/2021 0138  ? PHURINE 7.0 09/13/2021 0138  ? GLUCOSEU NEGATIVE 09/13/2021 0138  ? [place] NEGATIVE 09/13/2021 0138  ? [place] NEGATIVE 09/13/2021 0138  ? [place] NEGATIVE 09/13/2021 0138  ? PROTEINUR NEGATIVE 09/13/2021 0138  ? NITRITE NEGATIVE 09/13/2021 0138  ? LEUKOCYTESUR NEGATIVE 09/13/2021 0138  ? ?Sepsis Labs: ?'@LABRCNTIP'$ (procalcitonin:4,lacticidven:4) ?) ?Recent Results (from the past 240 hour(s))  ?Resp Panel by RT-PCR (Flu A&B, Covid) Nasopharyngeal Swab     Status: None  ? Collection Time: 09/12/21  8:28 PM  ? Specimen: Nasopharyngeal Swab; Nasopharyngeal(NP) swabs in vial transport medium  ?Result Value Ref Range Status  ? SARS Coronavirus 2 by RT PCR NEGATIVE NEGATIVE Final  ?  Comment: (NOTE) ?SARS-CoV-2 target nucleic acids are NOT DETECTED. ? ?The SARS-CoV-2 RNA is generally detectable in upper respiratory ?specimens during the acute phase of infection. The lowest ?concentration of SARS-CoV-2 viral copies this assay can detect  is ?138 copies/mL. A negative result does not preclude SARS-Cov-2 ?infection and should not be used as the sole basis for treatment or ?other patient management decisions. A negative result may occur with  ?improper specimen collection/handling, submission of specimen other ?than nasopharyngeal swab, presence of viral mutation(s) within the ?areas targeted by this assay, and inadequate number of viral ?copies(<138 copies/mL). A negative result must be combined with ?clinical observations, patient history, and epidemiological ?information. The expected result is Negative. ? ?Fact Sheet for Patients:  ?EntrepreneurPulse.com.au ? ?Fact Sheet for Healthcare Providers:  ?IncredibleEmployment.be ? ?This test is no t yet approved or cleared by the [place] FDA and  ?has been authorized for detection and/or diagnosis of SARS-CoV-2 by ?FDA  under an Emergency Use Authorization (EUA). This EUA will remain  ?in effect (meaning this test can be used) for the duration of the ?COVID-19 declaration under Section 564(b)(1) of the Act, 21 ?U.S.C.section 36

## 2021-09-13 NOTE — Assessment & Plan Note (Signed)
No chest pain presently ?Continue statin ?Continue aspirin ?Echocardiogram ?Personally reviewed EKG--sinus rhythm, no ST-T wave change ?

## 2021-09-13 NOTE — Discharge Summary (Signed)
?Physician Discharge Summary ?  ?Patient: Omar Ruiz MRN: [id number] DOB: 01-21-1951  ?Admit date:     09/12/2021  ?Discharge date: 09/13/21  ?Discharge Physician: Shanon Brow Faelynn Wynder  ? ?PCP: [name], NP  ? ?Recommendations at discharge:  ? Please follow up with primary care provider within 1-2 weeks ? Please repeat BMP and CBC in one week ? ? ? ? ? ?Hospital Course: ?71 year old male with a history of coronary artery disease with DES 2011, hypertension, hyperlipidemia, OSA presenting with sudden onset of vertigo and slurred speech.  He also had a mild bitemporal headache at the time.  This occurred around 5:30 PM on 09/12/2021.  The patient states that he was out walking his dog and speaking with a neighbor during the walk when his symptoms began.  He denied any focal extremity weakness, blurry vision, or new onset dysesthesia.  He walked back home to drink some water and juice without any improvement.  EMS was activated. ?The patient states that his only new medication is Zoloft which he has only taken 2 doses.  He denies any new over-the-counter medications or supplements.  He denies any NSAIDs.  The patient states that he does drink a 12 pack of beer per week, but has not drank in about a week.  He denies any fevers, chills, chest pain, coughing, hemoptysis, nausea, vomiting, diarrhea, abd pain.  He denies any hematochezia, melena.  He states his appetite has been good. ?The patient does endorse dyspnea on exertion for the last 6 months without any frank chest discomfort or angina. ?In the ED, the patient was noted to be bradycardic with HR around 55 with soft blood pressures.  Oxygen saturation was 99% on room air.  BMP showed sodium 130, potassium 3.5, serum creatinine 1.13.  LFTs were unremarkable.  WBC 7.3, hemoglobin 13.7, platelets 228,000.  The patient was seen by teleneurology who recommended admission for further work-up for stroke. ? ?Assessment and Plan: ?* Vertigo ?Concerned about TIA ?Doubt  peripheral cause as the patient's symptoms have improved in <12 hours ?--neg nystagmus ?--neg head impulse test ?--+Skew deviation ?Certainly, the patient's bradycardia and soft blood pressures may be contributing to his symptoms ?-- Holding metoprolol temporarily ?-- Review of the medical record shows that the patient's heart rate 65-75 range during his recent physician appointments with SBP in the 120s-130s. ?Check orthostatics--neg ?MRI brain--neg ?-UA  and UDS are neg ?Remain on telemetry--no concerning dyrhythmias ?-overall improved with IVF and improving BP ?Seen by neurology>>ASA 81 and plavix 75 mg  X 21 days, then ASA 81 mg daily ? ?Dyspnea on exertion ?Patient relates dyspnea on exertion for the past 6 months ?-He has no chest discomfort ?May be partly due to the patient bradycardia ?Echocardiogram ?Personally reviewed EKG--sinus rhythm, no ST-T wave changes ?Chest x-ray ?Outpatient cardiology follow-up if echocardiogram is reassuring ?TSH ? ?Speech disturbance ?- Neurology Consult ?-PT/OT evaluation ?-Speech therapy eval ?-CT brain--neg ?-MRI brain-- ?-MRA brain-- ?-Carotid Duplex-- ?-Echo-- ?-LDL--84 ?-HbA1C-- ?-Antiplatelet--ASA 81 + plavix ? ?Sinus bradycardia ?Holding metoprolol temporarily>>HR remains in low 60s  ?-advised pt to cut metoprolol dose in half ?-continue checking BPs at home ?-follow up PCP for adjustment of anti-HTN meds ? ?Mixed hyperlipidemia ?Continue statin ?--restart zocor if pt is willing to take or try different statin in future ?--LDL 84 ? ?Essential hypertension ?Holding metoprolol as the patient has soft blood pressures ?-In the emergency department, systolic blood pressures in the upper 90s and low 100s ?-IV fluid hydration ?-with IV fluid hydration, SBP  improved into 120-130 range time of d/c ? ?CAD S/P percutaneous coronary angioplasty ?No chest pain presently ?Continue statin ?Continue aspirin ?Echocardiogram ?Personally reviewed EKG--sinus rhythm, no ST-T wave  change ? ? ? ? ?  ? ? ?Consultants: neurology ?Procedures performed: none ?Disposition: Home ?Diet recommendation:  ?Cardiac diet ?DISCHARGE MEDICATION: ?Allergies as of 09/13/2021   ?No Known Allergies ?  ? ?  ?Medication List  ?  ? ?STOP taking these medications   ? ?gabapentin 300 MG capsule ?Commonly known as: NEURONTIN ?  ?meloxicam 15 MG tablet ?Commonly known as: MOBIC ?  ?methocarbamol 500 MG tablet ?Commonly known as: ROBAXIN ?  ?oxyCODONE 5 MG immediate release tablet ?Commonly known as: Oxy IR/ROXICODONE ?  ?simvastatin 40 MG tablet ?Commonly known as: ZOCOR ?  ?traMADol 50 MG tablet ?Commonly known as: ULTRAM ?  ? ?  ? ?TAKE these medications   ? ?acetaminophen 500 MG tablet ?Commonly known as: TYLENOL ?Take 1,000 mg by mouth daily as needed for moderate pain or mild pain. ?  ?aspirin EC 81 MG tablet ?Take 81 mg by mouth daily. Swallow whole. ?  ?cholecalciferol 25 MCG (1000 UNIT) tablet ?Commonly known as: VITAMIN D3 ?Take 1,000 Units by mouth daily. ?  ?clopidogrel 75 MG tablet ?Commonly known as: PLAVIX ?Take 1 tablet (75 mg total) by mouth daily. ?Start taking on: Sep 14, 2021 ?  ?metoprolol succinate 25 MG 24 hr tablet ?Commonly known as: TOPROL-XL ?Take 0.5 tablets (12.5 mg total) by mouth daily. ?Start taking on: Sep 14, 2021 ?What changed: how much to take ?  ?multivitamin with minerals Tabs tablet ?Take 1 tablet by mouth daily. ?  ?nitroGLYCERIN 0.4 MG SL tablet ?Commonly known as: NITROSTAT ?Place 1 tablet (0.4 mg total) under the tongue every 5 (five) minutes as needed for chest pain. ?  ? ?  ? ? ?Discharge Exam: ?Filed Weights  ? 09/12/21 2030  ?Weight: 115.3 kg  ? ?HEENT:  [place]/AT, No thrush, no icterus ?CV:  RRR, no rub, no S3, no S4 ?Lung:  CTA, no wheeze, no rhonchi ?Abd:  soft/+BS, NT ?Ext:  No edema, no lymphangitis, no synovitis, no rash ?Neuro:  CN II-XII intact, strength 4/5 in RUE, RLE, strength 4/5 LUE, LLE; sensation intact bilateral; no dysmetria; babinski equivocal ? ? ?Condition at  discharge: stable ? ?The results of significant diagnostics from this hospitalization (including imaging, microbiology, ancillary and laboratory) are listed below for reference.  ? ?Imaging Studies: ?DG Chest 2 View ? ?Result Date: 09/13/2021 ?CLINICAL DATA:  Shortness of breath.  Asthma/COPD.  Ex-smoker. EXAM: CHEST - 2 VIEW COMPARISON:  Radiographs 12/16/2017 and 09/13/2015. FINDINGS: The heart size and mediastinal contours are stable. There is mild scarring laterally at the left lung base which appears unchanged. The lungs are otherwise clear. No pleural effusion or pneumothorax. No acute osseous findings. Interval right total shoulder arthroplasty. IMPRESSION: No evidence of active cardiopulmonary process. Electronically Signed   By: Richardean Sale M.D.   On: 09/13/2021 09:09  ? ?MR ANGIO HEAD WO CONTRAST ? ?Result Date: 09/13/2021 ?CLINICAL DATA:  Dizziness and slurred speech starting at 7 p.m. EXAM: MRI HEAD WITHOUT CONTRAST MRA HEAD WITHOUT CONTRAST MRA NECK WITHOUT AND WITH CONTRAST TECHNIQUE: Multiplanar, multi-echo pulse sequences of the brain and surrounding structures were acquired without intravenous contrast. Angiographic images of the Circle of Willis were acquired using MRA technique without intravenous contrast. Angiographic images of the neck were acquired using MRA technique without and with intravenous contrast. Carotid stenosis measurements (when applicable) are  obtained utilizing NASCET criteria, using the distal internal carotid diameter as the denominator. CONTRAST:  61m GADAVIST GADOBUTROL 1 MMOL/ML IV SOLN COMPARISON:  Noncontrast CT head dated a prior FINDINGS: MRI HEAD FINDINGS Brain: No acute intracranial, extra-axial fluid collection, or acute infarct Parenchymal volume is normal. The ventricles are normal in size. Gray-white differentiation is preserved. Parenchymal signal is normal, with no of white matter microangiopathic change. There is no mass lesion.  There is no mass effect or  midline shift. Vascular: See below. Skull and upper cervical spine: Normal marrow signal. Sinuses/Orbits: The paranasal sinuses are clear. The globes are unremarkable. Other: None. MRA HEAD FINDINGS Anterior circulation: In

## 2021-09-13 NOTE — ED Notes (Signed)
Tray given to pt.

## 2021-09-13 NOTE — Assessment & Plan Note (Addendum)
Holding metoprolol as the patient has soft blood pressures ?-In the emergency department, systolic blood pressures in the upper 90s and low 100s ?-IV fluid hydration ?-with IV fluid hydration, SBP improved into 120-130 range time of d/c ?

## 2021-09-13 NOTE — Hospital Course (Signed)
71 year old male with a history of coronary artery disease with DES 2011, hypertension, hyperlipidemia, OSA presenting with sudden onset of vertigo and slurred speech.  He also had a mild bitemporal headache at the time.  This occurred around 5:30 PM on 09/12/2021.  The patient states that he was out walking his dog and speaking with a neighbor during the walk when his symptoms began.  He denied any focal extremity weakness, blurry vision, or new onset dysesthesia.  He walked back home to drink some water and juice without any improvement.  EMS was activated. ?The patient states that his only new medication is Zoloft which he has only taken 2 doses.  He denies any new over-the-counter medications or supplements.  He denies any NSAIDs.  The patient states that he does drink a 12 pack of beer per week, but has not drank in about a week.  He denies any fevers, chills, chest pain, coughing, hemoptysis, nausea, vomiting, diarrhea, abd pain.  He denies any hematochezia, melena.  He states his appetite has been good. ?The patient does endorse dyspnea on exertion for the last 6 months without any frank chest discomfort or angina. ?In the ED, the patient was noted to be bradycardic with HR around 55 with soft blood pressures.  Oxygen saturation was 99% on room air.  BMP showed sodium 130, potassium 3.5, serum creatinine 1.13.  LFTs were unremarkable.  WBC 7.3, hemoglobin 13.7, platelets 228,000.  The patient was seen by teleneurology who recommended admission for further work-up for stroke. ?

## 2021-09-13 NOTE — ED Notes (Signed)
PT assessed pt.  

## 2021-09-13 NOTE — Progress Notes (Signed)
*  PRELIMINARY RESULTS* ?Echocardiogram ?2D Echocardiogram has been performed. ? ?Omar Ruiz ?09/13/2021, 3:26 PM ?

## 2021-09-13 NOTE — Assessment & Plan Note (Addendum)
Concerned about TIA ?Doubt peripheral cause as the patient's symptoms have improved in <12 hours ?--neg nystagmus ?--neg head impulse test ?--+Skew deviation ?Certainly, the patient's bradycardia and soft blood pressures may be contributing to his symptoms ?-- Holding metoprolol temporarily ?-- Review of the medical record shows that the patient's heart rate 65-75 range during his recent physician appointments with SBP in the 120s-130s. ?Check orthostatics--neg ?MRI brain--neg ?-UA  and UDS are neg ?Remain on telemetry--no concerning dyrhythmias ?-overall improved with IVF and improving BP ?Seen by neurology>>ASA 81 and plavix 75 mg  X 21 days, then ASA 81 mg daily ?

## 2021-09-13 NOTE — Progress Notes (Signed)
OT Cancellation Note ? ?Patient Details ?Name: Omar Ruiz ?MRN: [id number] ?DOB: [birth year] ? ? ?Cancelled Treatment:    Reason Eval/Treat Not Completed: OT screened, no needs identified, will sign off. Discussed pt with PT who was able to evaluate the pt. No concerns noted with PT reported pt is near baseline. Pt will be removed from OT list.  ? ?Larey Seat OT, MOT ? ? ?Larey Seat ?09/13/2021, 1:43 PM ?

## 2021-09-17 LAB — VITAMIN B1: Vitamin B1 (Thiamine): 124.6 nmol/L (ref 66.5–200.0)

## 2021-10-02 ENCOUNTER — Institutional Professional Consult (permissible substitution): Payer: TRICARE For Life (TFL) | Admitting: Pulmonary Disease

## 2021-10-25 ENCOUNTER — Ambulatory Visit (INDEPENDENT_AMBULATORY_CARE_PROVIDER_SITE_OTHER): Payer: Medicare PPO | Admitting: Pulmonary Disease

## 2021-10-25 ENCOUNTER — Encounter: Payer: Self-pay | Admitting: Pulmonary Disease

## 2021-10-25 VITALS — BP 140/86 | HR 84 | Temp 97.7°F | Ht 72.0 in | Wt 250.8 lb

## 2021-10-25 DIAGNOSIS — G4733 Obstructive sleep apnea (adult) (pediatric): Secondary | ICD-10-CM | POA: Diagnosis not present

## 2021-10-25 NOTE — Progress Notes (Signed)
[facility]  Chief Complaint  Patient presents with   Consult    Patient had cpap before and mask he liked discontinued and patient stopped using CPAP after that     Past Surgical History:  He  has a past surgical history that includes STENTS (09/05/2009); Colonoscopy (10/2004); Colonoscopy (N/A, 03/29/2013); Nasal sinus surgery; Skin cancer excision; Total shoulder arthroplasty (Right, 12/24/2017); Mouth surgery; and Total knee arthroplasty (Right, 08/27/2020).  Past Medical History:  Osteoarthritis, CAD s/p stent, HLD, HTN, Depression  Constitutional:  BP 140/86 (BP Location: Left Arm, Patient Position: Sitting)   Pulse 84   Temp 97.7 F (36.5 C) (Temporal)   Ht 6' (1.829 m)   Wt 250 lb 12.8 oz (113.8 kg)   SpO2 98% Comment: ra  BMI 34.01 kg/m   Brief Summary:  Omar Ruiz is a 71 y.o. male former smoker with obstructive sleep apnea.      Subjective:   He is here with his wife.  He had a sleep study several years ago and was started on CPAP.  He used [employer] for his DME.  About a year ago he was told his CPAP mask was no longer being made and he switched to a different mask, but this wasn't comfortable.  When he went back to his DME he was told his insurance no longer worked with them.  He got frustrated and stopped using CPAP.  He was seen recently by his cardiologist and advised he should reconsider therapy for sleep apnea.  He snores and his breathing gets shallow while asleep.  He is a restless sleeper.  He talks in his sleep.  He takes naps in the afternoon.  He goes to sleep between 9 and 11 pm.  He falls asleep in 10 minutes.  He wakes up 2 or 3 times to use the bathroom.  He gets out of bed at 7:15 am.  He feels okay in the morning.  He denies morning headache.  He does not use anything to help him fall sleep.  He drinks coffee in the morning.  He denies sleep walking, bruxism, or nightmares.  There is no  history of restless legs.  He denies sleep hallucinations, sleep paralysis, or cataplexy.  The Epworth score is 3 out of 24.  He asked about Inspire device.  He was seen several years ago by Dr. Redmond Baseman with ENT for chronic sinusitis related to a fungus ball he thinks developed after he want swimming in a cave in [place].   Physical Exam:   Appearance - well kempt   ENMT - no sinus tenderness, no oral exudate, no LAN, Mallampati 4 airway, no stridor, mild TMJ click  Respiratory - equal breath sounds bilaterally, no wheezing or rales  CV - s1s2 regular rate and rhythm, no murmurs  Ext - no clubbing, no edema  Skin - no rashes  Psych - normal mood and affect   Pulmonary testing:  PFT 03/18/17 >> FEV1 3.38 (87%), FEV1% 81, TLC 7.19 (94%), DLCO 79%  Sleep Tests:    Cardiac Tests:  Echo 09/13/21 >> EF 65 to 70%, mild LVH, severe RA dilation, aortic root 37 mm  Social History:  He  reports that he quit smoking about 40 years ago. His smoking use included cigarettes. He has never used smokeless tobacco. He reports current alcohol use of about 12.0 standard drinks of alcohol per week. He reports that he does not use drugs.  Family History:  His family history includes Asthma in an other family member; CAD in his brother; Diabetes in his mother and another family member; Heart disease in his father; Lung disease in an other family member.    Discussion:  He has snoring, sleep disruption, apnea, and daytime sleepiness.  He has history of coronary artery disease.  He has prior history of obstructive sleep apnea, and I am concerned he could still have this.  Assessment/Plan:   Snoring with excessive daytime sleepiness. - will need to arrange for a home sleep study  Coronary artery disease. - followed by Dr. Rudean Haskell with cardiology  Obesity. - discussed how weight can impact sleep and risk for sleep disordered breathing - discussed options to assist with weight loss:  combination of diet modification, cardiovascular and strength training exercises  Cardiovascular risk. - had an extensive discussion regarding the adverse health consequences related to untreated sleep disordered breathing - specifically discussed the risks for hypertension, coronary artery disease, cardiac dysrhythmias, cerebrovascular disease, and diabetes - lifestyle modification discussed  Safe driving practices. - discussed how sleep disruption can increase risk of accidents, particularly when driving - safe driving practices were discussed  Therapies for obstructive sleep apnea. - if the sleep study shows significant sleep apnea, then various therapies for treatment were reviewed: CPAP, oral appliance, and surgical interventions  Time Spent Involved in Patient Care on Day of Examination:  35 minutes  Follow up:   Patient Instructions  Will arrange for home sleep study Will call to arrange for follow up after sleep study reviewed   Medication List:   Allergies as of 10/25/2021   No Known Allergies      Medication List        Accurate as of October 25, 2021  9:41 AM. If you have any questions, ask your nurse or doctor.          STOP taking these medications    clopidogrel 75 MG tablet Commonly known as: PLAVIX Stopped by: Chesley Mires, MD       TAKE these medications    acetaminophen 500 MG tablet Commonly known as: TYLENOL Take 1,000 mg by mouth daily as needed for moderate pain or mild pain.   aspirin EC 81 MG tablet Take 81 mg by mouth daily. Swallow whole.   cholecalciferol 25 MCG (1000 UNIT) tablet Commonly known as: VITAMIN D3 Take 1,000 Units by mouth daily.   meloxicam 15 MG tablet Commonly known as: MOBIC Take by mouth.   metoprolol succinate 25 MG 24 hr tablet Commonly known as: TOPROL-XL Take 0.5 tablets (12.5 mg total) by mouth daily.   multivitamin with minerals Tabs tablet Take 1 tablet by mouth daily.   nitroGLYCERIN 0.4 MG SL  tablet Commonly known as: NITROSTAT Place 1 tablet (0.4 mg total) under the tongue every 5 (five) minutes as needed for chest pain.   sertraline 25 MG tablet Commonly known as: ZOLOFT Take by mouth.   simvastatin 40 MG tablet Commonly known as: ZOCOR Take 40 mg by mouth at bedtime.        Signature:  Chesley Mires, MD [facility] Pager - [phone] 10/25/2021, 9:41 AM

## 2021-10-25 NOTE — Patient Instructions (Signed)
Will arrange for home sleep study Will call to arrange for follow up after sleep study reviewed  

## 2021-10-28 ENCOUNTER — Ambulatory Visit (HOSPITAL_COMMUNITY)
Admission: RE | Admit: 2021-10-28 | Discharge: 2021-10-28 | Disposition: A | Discharge status: Home / Self Care | Payer: Medicare PPO | Source: Ambulatory Visit | Attending: Adult Health Nurse Practitioner | Admitting: Adult Health Nurse Practitioner

## 2021-10-28 ENCOUNTER — Other Ambulatory Visit (HOSPITAL_COMMUNITY): Payer: Self-pay | Admitting: Adult Health Nurse Practitioner

## 2021-10-28 DIAGNOSIS — M79642 Pain in left hand: Secondary | ICD-10-CM | POA: Diagnosis present

## 2021-10-28 DIAGNOSIS — M79641 Pain in right hand: Secondary | ICD-10-CM | POA: Insufficient documentation

## 2021-11-09 ENCOUNTER — Other Ambulatory Visit: Payer: Self-pay | Admitting: Student

## 2022-01-04 ENCOUNTER — Emergency Department (HOSPITAL_COMMUNITY)
Admission: EM | Admit: 2022-01-04 | Discharge: 2022-01-05 | Disposition: A | Discharge status: Home / Self Care | Payer: Medicare PPO | Attending: Emergency Medicine | Admitting: Emergency Medicine

## 2022-01-04 ENCOUNTER — Emergency Department (HOSPITAL_COMMUNITY): Payer: Medicare PPO

## 2022-01-04 ENCOUNTER — Other Ambulatory Visit: Payer: Self-pay

## 2022-01-04 DIAGNOSIS — S0101XA Laceration without foreign body of scalp, initial encounter: Secondary | ICD-10-CM | POA: Insufficient documentation

## 2022-01-04 DIAGNOSIS — S0990XA Unspecified injury of head, initial encounter: Secondary | ICD-10-CM | POA: Diagnosis present

## 2022-01-04 DIAGNOSIS — R109 Unspecified abdominal pain: Secondary | ICD-10-CM | POA: Diagnosis not present

## 2022-01-04 DIAGNOSIS — M546 Pain in thoracic spine: Secondary | ICD-10-CM | POA: Insufficient documentation

## 2022-01-04 DIAGNOSIS — Z85820 Personal history of malignant melanoma of skin: Secondary | ICD-10-CM | POA: Diagnosis not present

## 2022-01-04 DIAGNOSIS — Y92410 Unspecified street and highway as the place of occurrence of the external cause: Secondary | ICD-10-CM | POA: Insufficient documentation

## 2022-01-04 DIAGNOSIS — R0781 Pleurodynia: Secondary | ICD-10-CM | POA: Diagnosis not present

## 2022-01-04 DIAGNOSIS — I2510 Atherosclerotic heart disease of native coronary artery without angina pectoris: Secondary | ICD-10-CM | POA: Insufficient documentation

## 2022-01-04 DIAGNOSIS — M542 Cervicalgia: Secondary | ICD-10-CM | POA: Diagnosis not present

## 2022-01-04 DIAGNOSIS — I10 Essential (primary) hypertension: Secondary | ICD-10-CM | POA: Insufficient documentation

## 2022-01-04 DIAGNOSIS — W19XXXA Unspecified fall, initial encounter: Secondary | ICD-10-CM

## 2022-01-04 DIAGNOSIS — Z7982 Long term (current) use of aspirin: Secondary | ICD-10-CM | POA: Diagnosis not present

## 2022-01-04 DIAGNOSIS — S61412A Laceration without foreign body of left hand, initial encounter: Secondary | ICD-10-CM | POA: Insufficient documentation

## 2022-01-04 DIAGNOSIS — Z79899 Other long term (current) drug therapy: Secondary | ICD-10-CM | POA: Diagnosis not present

## 2022-01-04 DIAGNOSIS — S9032XA Contusion of left foot, initial encounter: Secondary | ICD-10-CM | POA: Diagnosis not present

## 2022-01-04 LAB — CBC WITH DIFFERENTIAL/PLATELET
Abs Immature Granulocytes: 0.08 10*3/uL — ABNORMAL HIGH (ref 0.00–0.07)
Basophils Absolute: 0.1 10*3/uL (ref 0.0–0.1)
Basophils Relative: 1 %
Eosinophils Absolute: 0.3 10*3/uL (ref 0.0–0.5)
Eosinophils Relative: 3 %
HCT: 40.4 % (ref 39.0–52.0)
Hemoglobin: 13.6 g/dL (ref 13.0–17.0)
Immature Granulocytes: 1 %
Lymphocytes Relative: 15 %
Lymphs Abs: 1.5 10*3/uL (ref 0.7–4.0)
MCH: 31.4 pg (ref 26.0–34.0)
MCHC: 33.7 g/dL (ref 30.0–36.0)
MCV: 93.3 fL (ref 80.0–100.0)
Monocytes Absolute: 0.9 10*3/uL (ref 0.1–1.0)
Monocytes Relative: 9 %
Neutro Abs: 7.2 10*3/uL (ref 1.7–7.7)
Neutrophils Relative %: 71 %
Platelets: 209 10*3/uL (ref 150–400)
RBC: 4.33 MIL/uL (ref 4.22–5.81)
RDW: 14.9 % (ref 11.5–15.5)
WBC: 10 10*3/uL (ref 4.0–10.5)
nRBC: 0 % (ref 0.0–0.2)

## 2022-01-04 LAB — TYPE AND SCREEN
ABO/RH(D): B NEG
Antibody Screen: NEGATIVE

## 2022-01-04 LAB — COMPREHENSIVE METABOLIC PANEL
ALT: 24 U/L (ref 0–44)
AST: 33 U/L (ref 15–41)
Albumin: 3.8 g/dL (ref 3.5–5.0)
Alkaline Phosphatase: 59 U/L (ref 38–126)
Anion gap: 8 (ref 5–15)
BUN: 13 mg/dL (ref 8–23)
CO2: 26 mmol/L (ref 22–32)
Calcium: 9 mg/dL (ref 8.9–10.3)
Chloride: 106 mmol/L (ref 98–111)
Creatinine, Ser: 1.21 mg/dL (ref 0.61–1.24)
GFR, Estimated: >60 mL/min (ref >60)
Glucose, Bld: 109 mg/dL — ABNORMAL HIGH (ref 70–99)
Potassium: 4.2 mmol/L (ref 3.5–5.1)
Sodium: 140 mmol/L (ref 135–145)
Total Bilirubin: 0.5 mg/dL (ref 0.3–1.2)
Total Protein: 6.3 g/dL — ABNORMAL LOW (ref 6.5–8.1)

## 2022-01-04 MED ORDER — TETANUS-DIPHTH-ACELL PERTUSSIS 5-2.5-18.5 LF-MCG/0.5 IM SUSY: 0.5000 mL | PREFILLED_SYRINGE | Freq: Once | INTRAMUSCULAR | Status: DC

## 2022-01-04 MED ORDER — FENTANYL CITRATE PF 50 MCG/ML IJ SOSY: 50.0000 ug | PREFILLED_SYRINGE | Freq: Once | INTRAMUSCULAR | Status: DC

## 2022-01-04 MED ORDER — IOHEXOL 300 MG/ML  SOLN
100.0000 mL | Freq: Once | INTRAMUSCULAR | Status: AC | PRN
  Administered 2022-01-04: 100 mL via INTRAVENOUS

## 2022-01-04 MED ORDER — ONDANSETRON HCL 4 MG/2ML IJ SOLN
4.0000 mg | Freq: Once | INTRAMUSCULAR | Status: AC
  Administered 2022-01-05: 4 mg via INTRAVENOUS
  Filled 2022-01-04: qty 2

## 2022-01-04 MED ORDER — LACTATED RINGERS IV BOLUS
1000.0000 mL | Freq: Once | INTRAVENOUS | Status: AC
  Administered 2022-01-04: 1000 mL via INTRAVENOUS

## 2022-01-04 NOTE — Discharge Instructions (Signed)
Omar Ruiz was seen in the emergency room today after falling off the back of his truck.  We did scans of his entire body that did not show any significant traumatic injuries.  We incidentally found that his aorta in his chest is slightly larger than it should be.  We would like you to schedule appointment with his primary care doctor to discuss this finding and determine how this needs to be addressed moving forward.  You may feel symptoms of concussion including lightheadedness, fogginess and nausea moving forward.  Please see documentation above for your reference regarding concussions.  Please come back to the emergency department if he is unable to keep anything down by mouth or is getting significantly more confused.  He may see his primary care doctor in the next 10 days to have staples removed from his head.

## 2022-01-04 NOTE — ED Provider Notes (Signed)
[facility] EM[facility] DEPARTMENT Provider Note   CSN: [id number] Arrival date & time: 01/04/22  1930     History  Chief Complaint  Patient presents with   Omar Ruiz is a 71 y.o. male with a past medical history of coronary disease status post DES to LAD in 2011, hyperlipidemia, hypertension, who presents the emergency department after a from the bed of his pickup truck.  Patient states that he was washing the top of his pickup truck and had slipped on the bed striking his upper and middle back as well as the back of his head on his driveway.  There was no associated loss of consciousness.  The patient was able to stand up and ambulate independently thereafter.  He presents the emergency department with complaints of headache, upper and middle back pain.  He does not take any blood thinning medication.  Patient states that his last tetanus shot was within the past 2 to 3 years approximately.  Fall Associated symptoms include headaches. Pertinent negatives include no chest pain, no abdominal pain and no shortness of breath.   Past Medical History:  Diagnosis Date   Arthritis    CAD (coronary artery disease)    a. s/p DES to LAD in 2011 with low-risk NST in 02/2017   Cancer [facility])    skin cancer on hand, cancer removed   Chest pain 07/2004   2D Echo EF>55%   Hyperlipemia    Hypertension 10/2004   stress test EF 57%   Obesity    Palpitation    Sleep apnea        Home Medications Prior to Admission medications   Medication Sig Start Date End Date Taking? Authorizing Provider  acetaminophen (TYLENOL) 500 MG tablet Take 1,000 mg by mouth daily as needed for moderate pain or mild pain.    [provider]  aspirin EC 81 MG tablet Take 81 mg by mouth daily. Swallow whole.    [provider]  cholecalciferol (VITAMIN D3) 25 MCG (1000 UNIT) tablet Take 1,000 Units by mouth daily.    [provider]  meloxicam (MOBIC) 15 MG  tablet Take by mouth. 10/21/21   [provider]  metoprolol succinate (TOPROL-XL) 25 MG 24 hr tablet TAKE 1 TABLET(25 MG) BY MOUTH DAILY 11/11/21   Chandrasekhar, Mahesh A, MD  Multiple Vitamin (MULTIVITAMIN WITH MINERALS) TABS tablet Take 1 tablet by mouth daily.    [provider]  nitroGLYCERIN (NITROSTAT) 0.4 MG SL tablet Place 1 tablet (0.4 mg total) under the tongue every 5 (five) minutes as needed for chest pain. 12/09/19   Strader, Fransisco Hertz, PA-C  sertraline (ZOLOFT) 25 MG tablet Take by mouth. 10/09/21   [provider]  simvastatin (ZOCOR) 40 MG tablet Take 40 mg by mouth at bedtime. 10/23/21   [provider]      Allergies    Patient has no known allergies.    Review of Systems   Review of Systems  Constitutional:  Negative for chills and fever.  HENT:  Negative for ear pain and sore throat.   Eyes:  Negative for pain and visual disturbance.  Respiratory:  Negative for cough and shortness of breath.   Cardiovascular:  Negative for chest pain and palpitations.  Gastrointestinal:  Negative for abdominal pain and vomiting.  Genitourinary:  Negative for dysuria and hematuria.  Musculoskeletal:  Positive for back pain. Negative for arthralgias.  Skin:  Negative for color change and rash.  Neurological:  Positive for headaches. Negative for seizures and syncope.  All other systems reviewed and are negative.   Physical Exam Updated Vital Signs There were no vitals taken for this visit. Physical Exam Vitals and nursing note reviewed.  Constitutional:      General: He is not in acute distress.    Appearance: He is well-developed.     Comments: Upon entering the exam the patient is lying in bed awake and alert rigid cervical collar in place.  No acute distress.  Dried blood on the patients shirt  HENT:     Head: Normocephalic and atraumatic.     Comments: Multiple small subcentimeter abrasions to the back of the patient's head without overlying  appreciable significant laceration.  No palpable skull fracture Eyes:     Conjunctiva/sclera: Conjunctivae normal.  Cardiovascular:     Rate and Rhythm: Normal rate and regular rhythm.     Heart sounds: No murmur heard. Pulmonary:     Effort: Pulmonary effort is normal. No respiratory distress.     Breath sounds: Normal breath sounds.     Comments: CTAB.  No chest wall tenderness to AP or lateral compression.  There is bony tenderness to the patient's posterior ribs Abdominal:     Palpations: Abdomen is soft.     Tenderness: There is no abdominal tenderness.     Comments: Soft nondistended nontender  Musculoskeletal:        General: No swelling.     Cervical back: Neck supple.     Comments: Tenderness to palpation at approximately C7 to high T-spine.  No step-off or deformity.  Skin tear overlying the dorsal aspect of the patient's left hand without underlying bony tenderness or deformity.  Distally neurovascular intact.  Patient does have some tenderness to palpation of the great and second toes of the left foot.  Sensation intact, motor function intact.  Some ecchymosis present without appreciable bony deformity.  Skin:    General: Skin is warm and dry.     Capillary Refill: Capillary refill takes less than 2 seconds.  Neurological:     Mental Status: He is alert.     Comments: Patient is fully alert and oriented.  He is moving all extremity spontaneously.  Cranial nerves II through XII grossly intact.  Grip strength symmetric and appropriate bilaterally.  Hip flexion 5 out of 5 bilaterally.  Sensation intact and symmetric in the upper and lower extremities.  Psychiatric:        Mood and Affect: Mood normal.     ED Results / Procedures / Treatments   Labs (all labs ordered are listed, but only abnormal results are displayed) Labs Reviewed - No data to display  EKG None  Radiology No results found.  Procedures .Marland KitchenLaceration Repair  Date/Time: 01/04/2022 11:41  PM  Performed by: Levie Heritage, MD Authorized by: Noemi Chapel, MD   Consent:    Consent obtained:  Verbal   Risks discussed:  Infection, pain, need for additional repair and poor cosmetic result Universal protocol:    Patient identity confirmed:  Verbally with patient Laceration details:    Location:  Scalp   Length (cm):  3   Depth (mm):  1     Medications Ordered in ED Medications - No data to display  ED Course/ Medical Decision Making/ A&P Clinical Course as of 01/04/22 2339  Sat Jan 04, 2022  2320 DG Chest [place] 1 View [JG]    Clinical Course User Index [JG] Levie Heritage, MD  Medical Decision Making Amount and/or Complexity of Data Reviewed Labs: ordered. Radiology: ordered. Decision-making details documented in ED Course.  Risk Prescription drug management.   Patient presents the emergency department hemodynamically stable After fall from truck bed as described above with tenderness to the low cervical and proximal thoracic spine and posterior ribs.  Given age and mechanism, will obtain CT scan of the head C-spine, chest abdomen and pelvis with spinal reformats.  Will obtain plain films of the patient's foot.   I personally reviewed and interpreted the patient's radiographic imaging which was negative for acute traumatic pathology.  The patient was informed of the incidental finding of a slightly dilated aorta.  The patient was informed that he must follow-up with his primary care physician to discuss routine annual imaging.  I personally removed a approximate 1 cm stone in the back of the patient's occiput.  This revealed an underlying 3 cm laceration that was repaired as above with extensive irrigation and removal of's multiple small several bubbles.  There was a separate 1 cm laceration just inferior that required 1 staple.  While completing the patient's staples, he had a vasovagal event where his blood pressure had dropped to the  02D systolic.  The patient had also become bradycardic to the 50s.  Upon completion of laceration repair, patient started to feel better.  In the absence of any traumatic pathology on exam feel that the patient's intermittent hypotension as above is likely related to vagal event secondary to pain.  Per patient request he was given 1 L lactated Ringer's in addition to intravenous Zofran.  Family in room amenable to plan to discharge despite vasovagal symptoms.  Patient was discharged with instructions to rest for the next 4 days and follow-up with a primary care physician to have staples removed within the next 10 days.  Patient was given strict return precautions to the emergency department for altered mentation, inability to tolerate oral intake or any new symptom moving forward.          Final Clinical Impression(s) / ED Diagnoses Final diagnoses:  Fall, initial encounter  Laceration of scalp, initial encounter    Rx / DC Orders ED Discharge Orders     None         Levie Heritage, MD 01/04/22 2346    Noemi Chapel, MD 01/05/22 2113

## 2022-01-04 NOTE — ED Triage Notes (Signed)
Pt BIB EMS after fall out of the bed of his truck while washing truck. Pt fell over side of truck and hit his back and head on gravel. Pain to L rib and R shoulder, lac to back of head. No LOC, ambulatory afterwards. VSS, AO4

## 2022-01-04 NOTE — Care Plan (Signed)
Delayed CT due to no IV, messaged RN and pt. Now has an IV that was not charted.

## 2022-01-05 DIAGNOSIS — S0101XA Laceration without foreign body of scalp, initial encounter: Secondary | ICD-10-CM | POA: Diagnosis not present

## 2022-01-05 MED ORDER — ACETAMINOPHEN 500 MG PO TABS
1000.0000 mg | ORAL_TABLET | Freq: Once | ORAL | Status: AC
Start: 2022-01-05 — End: 2022-01-05
  Administered 2022-01-05: 1000 mg via ORAL
  Filled 2022-01-05: qty 2

## 2022-01-05 NOTE — ED Notes (Signed)
Pt ambulated to restroom without any complaints.

## 2022-01-05 NOTE — ED Notes (Signed)
Patient verbalizes understanding of d/c instructions. Opportunities for questions and answers were provided. Pt d/c from ED and wheeled to lobby with family.  

## 2022-01-12 NOTE — Progress Notes (Unsigned)
Office Visit    Patient Name: Omar Ruiz Date of Encounter: 01/12/2022  Primary Care Provider:  [name], NP Primary Cardiologist:  None Primary Electrophysiologist: None  Chief Complaint    Omar Ruiz is a 71 y.o. male with PMH of CAD s/p 2011 LAD DES, HTN, COPD HLD, and OSA (on CPAP) who presents today for abnormal CT findings.  Past Medical History    Past Medical History:  Diagnosis Date   Arthritis    CAD (coronary artery disease)    a. s/p DES to LAD in 2011 with low-risk NST in 02/2017   Cancer [facility])    skin cancer on hand, cancer removed   Chest pain 07/2004   2D Echo EF>55%   Hyperlipemia    Hypertension 10/2004   stress test EF 57%   Obesity    Palpitation    Sleep apnea    Past Surgical History:  Procedure Laterality Date   COLONOSCOPY  10/2004   COLONOSCOPY N/A 03/29/2013   Procedure: COLONOSCOPY;  Surgeon: Omar So, MD;  [facility];  Service: Gastroenterology;  Laterality: N/A;   MOUTH SURGERY     NASAL SINUS SURGERY     SKIN CANCER EXCISION     right hand    STENTS  09/05/2009   3.0x68m promus drug eluting stent for a 90% mid LAD artery stenosis   TOTAL KNEE ARTHROPLASTY Right 08/27/2020   Procedure: TOTAL KNEE ARTHROPLASTY;  Surgeon: Omar Arabian MD;  Location: [facility];  Service: Orthopedics;  Laterality: Right;  53m   TOTAL SHOULDER ARTHROPLASTY Right 12/24/2017   Procedure: RIGHT TOTAL SHOULDER ARTHROPLASTY;  Surgeon: Omar Ruiz;  [place] [facility] Service: Orthopedics;  Laterality: Right;    Allergies  No Known Allergies  History of Present Illness    Omar Ruiz a [age]ear old male with the above-mentioned past medical history who presents today for follow-up of abnormal CT findings.  Mr. StGirtonad a mid LAD stent placed in 2011 due to shortness of breath.  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  He was last seen by Dr. ChGasper Ruiz 07/2021 for follow-up.  During visit patient was stable with no chest pain.  He was referred to sleep medicine for discussion of nonmask based sleep appliances.  He presented to the ED on 09/12/2021 with complaint of aphasia.  His stroke scale was 0 and head CT was negative for acute abnormalities.  He completed formal stroke work-up.  MRA of the brain was completed that was normal with no acute pathology.  Omar Ruiz seen recently on 12/2021 due to fall suffered when washing the top of pickup truck.  CT of the head and chest was completed that showed no acute changes to head but revealed slightly dilated aorta.  There was slight dilation of the aortic root at 4.2 cm and 4.0 in the ascending segment.  Mr. StBusheyresents today with his wife for follow-up of recent ED visit.  He is experienced some lightheadedness and shortness of breath.  He is also experienced increased heart flutters more prominent in the evenings.  He reports that he does drink a pot of coffee a day and we discussed the importance of abstaining from caffeine.  He describes the palpitations as a fluttering that comes and goes but recently stayed constant and occurred at rest.  He also endorsed some chest discomfort that occurs with and without activity.  This is relieved mainly with rest and is not debilitating.  His blood pressure today was slightly elevated at 138/88.  He recently had sleep study performed few days ago and is awaiting results.  He is hoping to get the inspire device for management of sleep apnea.  Patient denies chest pain, palpitations, dyspnea, PND, orthopnea, nausea, vomiting, dizziness, syncope, edema, weight gain, or early satiety.   Home Medications    Current Outpatient Medications  Medication Sig Dispense Refill   acetaminophen (TYLENOL) 500 MG tablet Take 1,000 mg by mouth daily as needed for moderate pain or mild pain.      aspirin EC 81 MG tablet Take 81 mg by mouth daily. Swallow whole.     cholecalciferol (VITAMIN D3) 25 MCG (1000 UNIT) tablet Take 1,000 Units by mouth daily.     metoprolol succinate (TOPROL-XL) 25 MG 24 hr tablet TAKE 1 TABLET(25 MG) BY MOUTH DAILY 90 tablet 3   Multiple Vitamin (MULTIVITAMIN WITH MINERALS) TABS tablet Take 1 tablet by mouth daily.     naproxen (NAPROSYN) 500 MG tablet Take 500 mg by mouth 2 (two) times daily.     nitroGLYCERIN (NITROSTAT) 0.4 MG SL tablet Place 1 tablet (0.4 mg total) under the tongue every 5 (five) minutes as needed for chest pain. 25 tablet 3   sertraline (ZOLOFT) 25 MG tablet Take 25 mg by mouth in the morning.     simvastatin (ZOCOR) 40 MG tablet Take 40 mg by mouth in the morning.     No current facility-administered medications for this visit.     Review of Systems  Please see the history of present illness.    (+)  (+) Fatigue and soreness  All other systems reviewed and are otherwise negative except as noted above.  Physical Exam    Wt Readings from Last 3 Encounters:  01/04/22 235 lb (106.6 kg)  10/25/21 250 lb 12.8 oz (113.8 kg)  09/12/21 254 lb 1.6 oz (115.3 kg)   CW:CBJSE were no vitals filed for this visit.,There is no height or weight on file to calculate BMI.  Constitutional:      Appearance: Healthy appearance. Not in distress.  Neck:     Vascular: JVD normal.  Pulmonary:     Effort: Pulmonary effort is normal.     Breath sounds: No wheezing. No rales. Diminished in the bases Cardiovascular:     Normal rate. Regular rhythm. Normal S1. Normal S2.      Murmurs: There is no murmur.  Edema:    Peripheral edema absent.  Abdominal:     Palpations: Abdomen is soft non tender. There is no hepatomegaly.  Skin:    General: Skin is warm and dry.  Neurological:     General: No focal deficit present.     Mental Status: Alert and oriented to person, place and time.     Cranial Nerves: Cranial nerves are intact.  EKG/LABS/Other  Studies Reviewed    ECG personally reviewed by me today -none completed today   Lab Results  Component Value Date   WBC 10.0 01/04/2022   HGB 13.6 01/04/2022   HCT 40.4 01/04/2022   MCV 93.3 01/04/2022   PLT 209 01/04/2022   Lab Results  Component Value Date   CREATININE 1.21 01/04/2022   BUN 13 01/04/2022   NA 140 01/04/2022   K 4.2 01/04/2022   CL 106 01/04/2022   CO2 26 01/04/2022  Lab Results  Component Value Date   ALT 24 01/04/2022   AST 33 01/04/2022   ALKPHOS 59 01/04/2022   BILITOT 0.5 01/04/2022   Lab Results  Component Value Date   CHOL 146 09/13/2021   HDL 42 09/13/2021   LDLCALC 84 09/13/2021   TRIG 98 09/13/2021   CHOLHDL 3.5 09/13/2021    Lab Results  Component Value Date   HGBA1C 5.8 (H) 09/13/2021    Assessment & Plan    1.  Coronary artery disease: -S/p DES x1 to mid LAD -Today patient reports he is having some chest tightness with and without activity -Continue GDMT with Toprol-XL 25 mg daily, ASA 81 mg daily, Zocor 40 mg daily -We will send for cardiac CTA to rule out ischemia -BMET completed recently in ED -Patient advised to follow-up with ED if chest pain increases in intensity  2.  Aortic dilation: - slight dilation of the aortic root at 4.2 cm and 4.0 in the ascending segment seen on CT of the chest -Patient advised regarding importance of abstaining from high intensity weight training and avoiding fluoroquinolone antibiotics -Continue GDMT with beta-blocker, statin, ASA 81 mg -Surveillance CT to be completed in 1 year  3.  Hyperlipidemia: -Patient's last LDL was 84slightly above goal of less than 70 -Continue high intensity statin as noted above  4.  HTN: -Blood pressure today was controlled at 138/88 -Patient states that blood pressures are much better at home and was advised to record and report findings in 2 weeks. We will plan to add ARB or ACE to current regimen due to hypotension with increased beta-blocker  therapy -Continue Toprol XL as noted above  5.  Obstructive sleep apnea: -Patient reports that he has recently completed updated sleep study. -He is hoping to qualify for inspire device for further management of sleep apnea  6.  Palpitations: -Patient reports palpitations that occur in the evening that are sustained and sometimes short lasting. -He denies any dizziness or shortness of breath with palpitations -We will order 14-day ZIO monitor to evaluate flutters and palpitations.  Disposition: Follow-up with None or APP in 1 months Shared Decision Making/Informed Consent{   Medication Adjustments/Labs and Tests Ordered: Current medicines are reviewed at length with the patient today.  Concerns regarding medicines are outlined above.   Signed, Mable Fill, Marissa Nestle, NP 01/12/2022, 1:52 PM [place]

## 2022-01-12 NOTE — H&P (View-Only) (Signed)
Office Visit    Patient Name: Omar Ruiz Date of Encounter: 01/12/2022  Primary Care Provider:  [name], NP Primary Cardiologist:  None Primary Electrophysiologist: None  Chief Complaint    Omar Ruiz is a 71 y.o. male with PMH of CAD s/p 2011 LAD DES, HTN, COPD HLD, and OSA (on CPAP) who presents today for abnormal CT findings.  Past Medical History    Past Medical History:  Diagnosis Date   Arthritis    CAD (coronary artery disease)    a. s/p DES to LAD in 2011 with low-risk NST in 02/2017   Cancer [facility])    skin cancer on hand, cancer removed   Chest pain 07/2004   2D Echo EF>55%   Hyperlipemia    Hypertension 10/2004   stress test EF 57%   Obesity    Palpitation    Sleep apnea    Past Surgical History:  Procedure Laterality Date   COLONOSCOPY  10/2004   COLONOSCOPY N/A 03/29/2013   Procedure: COLONOSCOPY;  Surgeon: [name], MD;  [facility];  Service: Gastroenterology;  Laterality: N/A;   MOUTH SURGERY     NASAL SINUS SURGERY     SKIN CANCER EXCISION     right hand    STENTS  09/05/2009   3.0x17m promus drug eluting stent for a 90% mid LAD artery stenosis   TOTAL KNEE ARTHROPLASTY Right 08/27/2020   Procedure: TOTAL KNEE ARTHROPLASTY;  Surgeon: [name] MD;  [facility]: [facility];  Service: Orthopedics;  Laterality: Right;  548m   TOTAL SHOULDER ARTHROPLASTY Right 12/24/2017   Procedure: RIGHT TOTAL SHOULDER ARTHROPLASTY;  Surgeon: ChTania AdeMD;  [place] [facility] Service: Orthopedics;  Laterality: Right;    Allergies  No Known Allergies  History of Present Illness    Omar Ruiz a [age]ear old male with the above-mentioned past medical history who presents today for follow-up of abnormal CT findings.  Omar Ruiz a mid LAD stent placed in 2011 due to shortness of breath.  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  He was last seen by Dr. ChGasper Sellsn 07/2021 for follow-up.  During visit patient was stable with no chest pain.  He was referred to sleep medicine for discussion of nonmask based sleep appliances.  He presented to the ED on 09/12/2021 with complaint of aphasia.  His stroke scale was 0 and head CT was negative for acute abnormalities.  He completed formal stroke work-up.  MRA of the brain was completed that was normal with no acute pathology.  Omar Ruiz seen recently on 12/2021 due to fall suffered when washing the top of pickup truck.  CT of the head and chest was completed that showed no acute changes to head but revealed slightly dilated aorta.  There was slight dilation of the aortic root at 4.2 cm and 4.0 in the ascending segment.  Omar Ruiz today with his wife for follow-up of recent ED visit.  He is experienced some lightheadedness and shortness of breath.  He is also experienced increased heart flutters more prominent in the evenings.  He reports that he does drink a pot of coffee a day and we discussed the importance of abstaining from caffeine.  He describes the palpitations as a fluttering that comes and goes but recently stayed constant and occurred at rest.  He also endorsed some chest discomfort that occurs with and without activity.  This is relieved mainly with rest and is not debilitating.  His blood pressure today was slightly elevated at 138/88.  He recently had sleep study performed few days ago and is awaiting results.  He is hoping to get the inspire device for management of sleep apnea.  Patient denies chest pain, palpitations, dyspnea, PND, orthopnea, nausea, vomiting, dizziness, syncope, edema, weight gain, or early satiety.   Home Medications    Current Outpatient Medications  Medication Sig Dispense Refill   acetaminophen (TYLENOL) 500 MG tablet Take 1,000 mg by mouth daily as needed for moderate pain or mild pain.      aspirin EC 81 MG tablet Take 81 mg by mouth daily. Swallow whole.     cholecalciferol (VITAMIN D3) 25 MCG (1000 UNIT) tablet Take 1,000 Units by mouth daily.     metoprolol succinate (TOPROL-XL) 25 MG 24 hr tablet TAKE 1 TABLET(25 MG) BY MOUTH DAILY 90 tablet 3   Multiple Vitamin (MULTIVITAMIN WITH MINERALS) TABS tablet Take 1 tablet by mouth daily.     naproxen (NAPROSYN) 500 MG tablet Take 500 mg by mouth 2 (two) times daily.     nitroGLYCERIN (NITROSTAT) 0.4 MG SL tablet Place 1 tablet (0.4 mg total) under the tongue every 5 (five) minutes as needed for chest pain. 25 tablet 3   sertraline (ZOLOFT) 25 MG tablet Take 25 mg by mouth in the morning.     simvastatin (ZOCOR) 40 MG tablet Take 40 mg by mouth in the morning.     No current facility-administered medications for this visit.     Review of Systems  Please see the history of present illness.    (+)  (+) Fatigue and soreness  All other systems reviewed and are otherwise negative except as noted above.  Physical Exam    Wt Readings from Last 3 Encounters:  01/04/22 235 lb (106.6 kg)  10/25/21 250 lb 12.8 oz (113.8 kg)  09/12/21 254 lb 1.6 oz (115.3 kg)   PY:PPJKD were no vitals filed for this visit.,There is no height or weight on file to calculate BMI.  Constitutional:      Appearance: Healthy appearance. Not in distress.  Neck:     Vascular: JVD normal.  Pulmonary:     Effort: Pulmonary effort is normal.     Breath sounds: No wheezing. No rales. Diminished in the bases Cardiovascular:     Normal rate. Regular rhythm. Normal S1. Normal S2.      Murmurs: There is no murmur.  Edema:    Peripheral edema absent.  Abdominal:     Palpations: Abdomen is soft non tender. There is no hepatomegaly.  Skin:    General: Skin is warm and dry.  Neurological:     General: No focal deficit present.     Mental Status: Alert and oriented to person, place and time.     Cranial Nerves: Cranial nerves are intact.  EKG/LABS/Other  Studies Reviewed    ECG personally reviewed by me today -none completed today   Lab Results  Component Value Date   WBC 10.0 01/04/2022   HGB 13.6 01/04/2022   HCT 40.4 01/04/2022   MCV 93.3 01/04/2022   PLT 209 01/04/2022   Lab Results  Component Value Date   CREATININE 1.21 01/04/2022   BUN 13 01/04/2022   NA 140 01/04/2022   K 4.2 01/04/2022   CL 106 01/04/2022   CO2 26 01/04/2022  Lab Results  Component Value Date   ALT 24 01/04/2022   AST 33 01/04/2022   ALKPHOS 59 01/04/2022   BILITOT 0.5 01/04/2022   Lab Results  Component Value Date   CHOL 146 09/13/2021   HDL 42 09/13/2021   LDLCALC 84 09/13/2021   TRIG 98 09/13/2021   CHOLHDL 3.5 09/13/2021    Lab Results  Component Value Date   HGBA1C 5.8 (H) 09/13/2021    Assessment & Plan    1.  Coronary artery disease: -S/p DES x1 to mid LAD -Today patient reports he is having some chest tightness with and without activity -Continue GDMT with Toprol-XL 25 mg daily, ASA 81 mg daily, Zocor 40 mg daily -We will send for cardiac CTA to rule out ischemia -BMET completed recently in ED -Patient advised to follow-up with ED if chest pain increases in intensity  2.  Aortic dilation: - slight dilation of the aortic root at 4.2 cm and 4.0 in the ascending segment seen on CT of the chest -Patient advised regarding importance of abstaining from high intensity weight training and avoiding fluoroquinolone antibiotics -Continue GDMT with beta-blocker, statin, ASA 81 mg -Surveillance CT to be completed in 1 year  3.  Hyperlipidemia: -Patient's last LDL was 84slightly above goal of less than 70 -Continue high intensity statin as noted above  4.  HTN: -Blood pressure today was controlled at 138/88 -Patient states that blood pressures are much better at home and was advised to record and report findings in 2 weeks. We will plan to add ARB or ACE to current regimen due to hypotension with increased beta-blocker  therapy -Continue Toprol XL as noted above  5.  Obstructive sleep apnea: -Patient reports that he has recently completed updated sleep study. -He is hoping to qualify for inspire device for further management of sleep apnea  6.  Palpitations: -Patient reports palpitations that occur in the evening that are sustained and sometimes short lasting. -He denies any dizziness or shortness of breath with palpitations -We will order 14-day ZIO monitor to evaluate flutters and palpitations.  Disposition: Follow-up with None or APP in 1 months Shared Decision Making/Informed Consent{   Medication Adjustments/Labs and Tests Ordered: Current medicines are reviewed at length with the patient today.  Concerns regarding medicines are outlined above.   Signed, Mable Fill, Marissa Nestle, NP 01/12/2022, 1:52 PM [place]

## 2022-01-14 ENCOUNTER — Ambulatory Visit: Payer: Medicare PPO

## 2022-01-14 DIAGNOSIS — G4733 Obstructive sleep apnea (adult) (pediatric): Secondary | ICD-10-CM

## 2022-01-15 ENCOUNTER — Ambulatory Visit: Payer: Medicare PPO | Attending: Nurse Practitioner

## 2022-01-15 ENCOUNTER — Encounter: Payer: Self-pay | Admitting: Nurse Practitioner

## 2022-01-15 ENCOUNTER — Other Ambulatory Visit: Payer: Self-pay | Admitting: Nurse Practitioner

## 2022-01-15 ENCOUNTER — Ambulatory Visit: Payer: Medicare PPO | Attending: Nurse Practitioner | Admitting: Nurse Practitioner

## 2022-01-15 VITALS — BP 138/82 | HR 65 | Ht 72.0 in | Wt 258.0 lb

## 2022-01-15 DIAGNOSIS — R072 Precordial pain: Secondary | ICD-10-CM

## 2022-01-15 DIAGNOSIS — R42 Dizziness and giddiness: Secondary | ICD-10-CM

## 2022-01-15 DIAGNOSIS — R002 Palpitations: Secondary | ICD-10-CM

## 2022-01-15 DIAGNOSIS — I7121 Aneurysm of the ascending aorta, without rupture: Secondary | ICD-10-CM

## 2022-01-15 DIAGNOSIS — G4733 Obstructive sleep apnea (adult) (pediatric): Secondary | ICD-10-CM

## 2022-01-15 DIAGNOSIS — I10 Essential (primary) hypertension: Secondary | ICD-10-CM

## 2022-01-15 DIAGNOSIS — I2510 Atherosclerotic heart disease of native coronary artery without angina pectoris: Secondary | ICD-10-CM | POA: Diagnosis not present

## 2022-01-15 MED ORDER — METOPROLOL TARTRATE 50 MG PO TABS: 50.0000 mg | ORAL_TABLET | Freq: Once | ORAL | 0 refills | Status: DC

## 2022-01-15 NOTE — Patient Instructions (Addendum)
Medication Instructions:  Your physician recommends that you continue on your current medications as directed. Please refer to the Current Medication list given to you today.  *If you need a refill on your cardiac medications before your next appointment, please call your pharmacy*   Testing/Procedures: Monitor Your physician has recommended that you wear an event monitor. Event monitors are medical devices that record the heart's electrical activity. Doctors most often [place] these monitors to diagnose arrhythmias. Arrhythmias are problems with the speed or rhythm of the heartbeat. The monitor is a small, portable device. You can wear one while you do your normal daily activities. This is usually used to diagnose what is causing palpitations/syncope (passing out).   Cardiac CT Your physician has requested that you have cardiac CT. Cardiac computed tomography (CT) is a painless test that uses an x-ray machine to take clear, detailed pictures of your heart. For further information please visit [email or web address]. Please follow instruction sheet as given.     Follow-Up: At [facility], you and your health needs are our priority.  As part of our continuing mission to provide you with exceptional heart care, we have created designated Provider Care Teams.  These Care Teams include your primary Cardiologist (physician) and Advanced Practice Providers (APPs -  Physician Assistants and Nurse Practitioners) who all work together to provide you with the care you need, when you need it.   Your next appointment:   4 weeks  The format for your next appointment:   In Person  Provider:   Werner Lean, MD  Or Ambrose Pancoast NP  Other Instructions   Your cardiac CT will be scheduled at one of the below locations:   [facility] [address] [place], [place] [address] [phone]  please arrive at the [facility] Entr[facility] (Entrance C2) of [facility] 30 minutes prior to test start time. You can use the FREE valet parking offered at entrance C (encouraged to control the heart rate for the test)  Proceed to the [facility] Radiology Department (first floor) to check-in and test prep.  All radiology patients and guests should use entrance C2 at [facility], accessed from [facility], even though the hospital's physical address listed is [address]     Please follow these instructions carefully (unless otherwise directed):  Hold all erectile dysfunction medications at least 3 days (72 hrs) prior to test.  On the Night Before the Test: Be sure to Drink plenty of water. Do not consume any caffeinated/decaffeinated beverages or chocolate 12 hours prior to your test. Do not take any antihistamines 12 hours prior to your test.  On the Day of the Test: Drink plenty of water until 1 hour prior to the test. Do not eat any food 4 hours prior to the test. You may take your regular medications prior to the test.  Take metoprolol (Lopressor) two hours prior to test. HOLD Furosemide/Hydrochlorothiazide morning of the test. FEMALES- please wear underwire-free bra if available, avoid dresses & tight clothing      After the Test: Drink plenty of water. After receiving IV contrast, you may experience a mild flushed feeling. This is normal. On occasion, you may experience a mild rash up to 24 hours after the test. This is not dangerous. If this occurs, you can take Benadryl 25 mg and increase your fluid intake. If you experience trouble breathing, this can be serious. If it is severe call 911 IMMEDIATELY.  If it is mild, please call our office. If you take any of these medications: Glipizide/Metformin, Avandament, Glucavance, please do not take 48 hours after completing test unless otherwise instructed.  We will call to schedule your test 2-4 weeks out understanding that some insurance companies will need an  authorization prior to the service being performed.   For non-scheduling related questions, please contact the cardiac imaging nurse navigator should you have any questions/concerns: Marchia Bond, Cardiac Imaging Nurse Navigator Gordy Clement, Cardiac Imaging Nurse Navigator [facility] Direct Office Dial: [phone]   For scheduling needs, including cancellations and rescheduling, please call [place], [phone].     ZIO XT- Long Term Monitor Instructions  Your physician has requested you wear a ZIO patch monitor for 14 days.  This is a single patch monitor. [employer] supplies one patch monitor per enrollment. Additional stickers are not available. Please do not apply patch if you will be having a Nuclear Stress Test,  Echocardiogram, Cardiac CT, MRI, or Chest Xray during the period you would be wearing the  monitor. The patch cannot be worn during these tests. You cannot remove and re-apply the  ZIO XT patch monitor.  Your ZIO patch monitor will be mailed 3 day USPS to your address on file. It may take 3-5 days  to receive your monitor after you have been enrolled.  Once you have received your monitor, please review the enclosed instructions. Your monitor  has already been registered assigning a specific monitor serial # to you.  Billing and Patient Assistance Program Information  We have supplied [employer] with any of your insurance information on file for billing purposes. [employer] offers a sliding scale Patient Assistance Program for patients that do not have  insurance, or whose insurance does not completely cover the cost of the ZIO monitor.  You must apply for the Patient Assistance Program to qualify for this discounted rate.  To apply, please call [employer] at [phone], select option 4, select option 2, ask to apply for  Patient Assistance Program. Theodore Demark will ask your household income, and how many people  are in your household. They will  quote your out-of-pocket cost based on that information.  [employer] will also be able to set up a 80-month interest-free payment plan if needed.  Applying the monitor   Shave hair from upper left chest.  Hold abrader disc by orange tab. Rub abrader in 40 strokes over the upper left chest as  indicated in your monitor instructions.  Clean area with 4 enclosed alcohol pads. Let dry.  Apply patch as indicated in monitor instructions. Patch will be placed under collarbone on left  side of chest with arrow pointing upward.  Rub patch adhesive wings for 2 minutes. Remove white label marked "1". Remove the white  label marked "2". Rub patch adhesive wings for 2 additional minutes.  While looking in a mirror, press and release button in center of patch. A small green light will  flash 3-4 times. This will be your only indicator that the monitor has been turned on.  Do not shower for the first 24 hours. You may shower after the first 24 hours.  Press the button if you feel a symptom. You will hear a small click. Record Date, Time and  Symptom in the Patient Logbook.  When you are ready to remove the patch, follow instructions on the last 2 pages of Patient  Logbook. Stick patch monitor onto the last page of Patient Logbook.  Place Patient Logbook in the blue and white box. Use locking tab on box and tape box closed  securely. The blue and white box has prepaid postage on it. Please place it in the mailbox as  soon as possible. Your physician should have your test results approximately 7 days after the  monitor has been mailed back to [facility].  Call [place] at [phone] if you have questions regarding  your ZIO XT patch monitor. Call them immediately if you see an orange light blinking on your  monitor.  If your monitor falls off in less than 4 days, contact our Monitor department at [phone].  If your monitor becomes loose or falls off after 4 days call [employer]  at [phone] for  suggestions on securing your monitor

## 2022-01-15 NOTE — Progress Notes (Unsigned)
Enrolled for [employer] to mail a ZIO XT long term holter monitor to the patients address on file.   Dr. Chandrasekhar to read. 

## 2022-01-16 ENCOUNTER — Telehealth: Payer: Self-pay | Admitting: Pulmonary Disease

## 2022-01-16 DIAGNOSIS — G4733 Obstructive sleep apnea (adult) (pediatric): Secondary | ICD-10-CM | POA: Diagnosis not present

## 2022-01-16 NOTE — Telephone Encounter (Signed)
Spoke with patient regarding HST results. They verbalized understanding. Appt made for tomorrow morning in [facility] office at 8:30 01/16/22. No further questions.  Nothing further needed at this time.

## 2022-01-16 NOTE — Telephone Encounter (Signed)
HST 01/14/22 >> AHI 16.4, SpO2 low 70%   Please inform him that his sleep study shows moderate obstructive sleep apnea.  Please arrange for ROV with me or NP to discuss treatment options.

## 2022-01-17 ENCOUNTER — Encounter: Payer: Self-pay | Admitting: Pulmonary Disease

## 2022-01-17 ENCOUNTER — Ambulatory Visit (INDEPENDENT_AMBULATORY_CARE_PROVIDER_SITE_OTHER): Payer: Medicare PPO | Admitting: Pulmonary Disease

## 2022-01-17 VITALS — BP 138/88 | HR 72 | Temp 98.4°F | Ht 72.0 in | Wt 255.8 lb

## 2022-01-17 DIAGNOSIS — R072 Precordial pain: Secondary | ICD-10-CM

## 2022-01-17 DIAGNOSIS — R002 Palpitations: Secondary | ICD-10-CM

## 2022-01-17 DIAGNOSIS — Z789 Other specified health status: Secondary | ICD-10-CM

## 2022-01-17 DIAGNOSIS — R42 Dizziness and giddiness: Secondary | ICD-10-CM

## 2022-01-17 DIAGNOSIS — G4733 Obstructive sleep apnea (adult) (pediatric): Secondary | ICD-10-CM

## 2022-01-17 NOTE — Patient Instructions (Signed)
Will arrange for referral to Dr. Melida Quitter with ENT to determine if you are a candidate for an Inspire device to treat obstructive sleep apnea  Follow up in 4 months in the [place] office

## 2022-01-17 NOTE — Progress Notes (Signed)
[facility]  Chief Complaint  Patient presents with   Follow-up    F/u on HST results     Past Surgical History:  He  has a past surgical history that includes STENTS (09/05/2009); Colonoscopy (10/2004); Colonoscopy (N/A, 03/29/2013); Nasal sinus surgery; Skin cancer excision; Total shoulder arthroplasty (Right, 12/24/2017); Mouth surgery; and Total knee arthroplasty (Right, 08/27/2020).  Past Medical History:  Osteoarthritis, CAD s/p stent, HLD, HTN, Depression  Constitutional:  BP 138/88 (BP Location: Left Arm, Patient Position: Sitting)   Pulse 72   Temp 98.4 F (36.9 C) (Temporal)   Ht 6' (1.829 m)   Wt 255 lb 12.8 oz (116 kg)   SpO2 96% Comment: ra  BMI 34.69 kg/m   Brief Summary:  Omar Ruiz is a 71 y.o. male former smoker with obstructive sleep apnea.      Subjective:   He is here with his wife.  His home sleep study showed moderate sleep apnea.  He previously tried CPAP.  He was tried on multiple masks, but was never able to adjust to using CPAP and would not want to try this again.    He has a partial denture, and isn't sure he would be able to wear an oral appliance.   Physical Exam:   Appearance - well kempt   ENMT - no sinus tenderness, no oral exudate, no LAN, Mallampati 4 airway, no stridor, mild TMJ click  Respiratory - equal breath sounds bilaterally, no wheezing or rales  CV - s1s2 regular rate and rhythm, no murmurs  Ext - no clubbing, no edema  Skin - no rashes  Psych - normal mood and affect   Pulmonary testing:  PFT 03/18/17 >> FEV1 3.38 (87%), FEV1% 81, TLC 7.19 (94%), DLCO 79%  Sleep Tests:  HST 01/14/22 >> AHI 16.4, SpO2 low 70%  Cardiac Tests:  Echo 09/13/21 >> EF 65 to 70%, mild LVH, severe RA dilation, aortic root 37 mm  Social History:  He  reports that he quit smoking about 40 years ago. His smoking use included cigarettes. He has never used smokeless tobacco. He reports  current alcohol use of about 12.0 standard drinks of alcohol per week. He reports that he does not use drugs.  Family History:  His family history includes Asthma in an other family member; CAD in his brother; Diabetes in his mother and another family member; Heart disease in his father; Lung disease in an other family member.     Assessment/Plan:   Obstructive sleep apnea. - sleep study reviewed - discussed how sleep apnea can impact his health - treatment options reviewed - intolerant of CPAP therapy - uncertain whether an oral appliance would be an option with his dental history - will arrange for referral to Dr. Melida Quitter with ENT to have him assessed for an Inspire device  Coronary artery disease. - followed by Dr. Rudean Haskell with cardiology  Obesity. - discussed how weight can impact sleep and risk for sleep disordered breathing - discussed options to assist with weight loss: combination of diet modification, cardiovascular and strength training exercises  Time Spent Involved in Patient Care on Day of Examination:  26 minutes  Follow up:   Patient Instructions  Will arrange for referral to Dr. Melida Quitter with ENT to determine if you are a candidate for an Inspire device to treat obstructive sleep apnea  Follow up in 4 months in the [place] office  Medication List:   Allergies  as of 01/17/2022   No Known Allergies      Medication List        Accurate as of January 17, 2022  8:54 AM. If you have any questions, ask your nurse or doctor.          acetaminophen 500 MG tablet Commonly known as: TYLENOL Take 1,000 mg by mouth daily as needed for moderate pain or mild pain.   albuterol 108 (90 Base) MCG/ACT inhaler Commonly known as: VENTOLIN HFA Inhale 2 puffs into the lungs every 6 (six) hours as needed.   aspirin EC 81 MG tablet Take 81 mg by mouth daily. Swallow whole.   azithromycin 250 MG tablet Commonly known as: ZITHROMAX Take by  mouth.   cholecalciferol 25 MCG (1000 UNIT) tablet Commonly known as: VITAMIN D3 Take 1,000 Units by mouth daily.   methocarbamol 500 MG tablet Commonly known as: ROBAXIN Take by mouth.   methylPREDNISolone 4 MG Tbpk tablet Commonly known as: MEDROL DOSEPAK Take by mouth.   metoprolol succinate 25 MG 24 hr tablet Commonly known as: TOPROL-XL TAKE 1 TABLET(25 MG) BY MOUTH DAILY   metoprolol tartrate 50 MG tablet Commonly known as: LOPRESSOR Take 1 tablet (50 mg total) by mouth once for 1 dose.   multivitamin with minerals Tabs tablet Take 1 tablet by mouth daily.   naproxen 500 MG tablet Commonly known as: NAPROSYN Take 500 mg by mouth 2 (two) times daily.   nitroGLYCERIN 0.4 MG SL tablet Commonly known as: NITROSTAT Place 1 tablet (0.4 mg total) under the tongue every 5 (five) minutes as needed for chest pain.   sertraline 25 MG tablet Commonly known as: ZOLOFT Take 25 mg by mouth in the morning.   simvastatin 40 MG tablet Commonly known as: ZOCOR Take 40 mg by mouth in the morning.        Signature:  Chesley Mires, MD [place] Pager - [phone] 01/17/2022, 8:54 AM

## 2022-01-30 ENCOUNTER — Telehealth (HOSPITAL_COMMUNITY): Payer: Self-pay | Admitting: *Deleted

## 2022-01-30 NOTE — Telephone Encounter (Signed)
Patient returning call regarding upcoming cardiac imaging study; pt verbalizes understanding of appt date/time, parking situation and where to check in, medications ordered, and verified current allergies; name and call back number provided for further questions should they arise  Gordy Clement RN Navigator Cardiac Imaging [facility] [phone] office [phone] cell  Patient to take '50mg'$  metoprolol tartrate two hours prior to his cardiac CT scan. He is aware to arrive at 3pm.

## 2022-01-30 NOTE — Telephone Encounter (Signed)
Attempted to call patient regarding upcoming cardiac CT appointment. °Left message on voicemail with name and callback number ° °Caela Huot RN Navigator Cardiac Imaging °[facility] °[phone] Office °[phone] Cell ° °

## 2022-01-31 ENCOUNTER — Ambulatory Visit (HOSPITAL_COMMUNITY)
Admission: RE | Admit: 2022-01-31 | Discharge: 2022-01-31 | Disposition: A | Discharge status: Home / Self Care | Payer: Medicare PPO | Source: Ambulatory Visit | Attending: Nurse Practitioner | Admitting: Nurse Practitioner

## 2022-01-31 DIAGNOSIS — R072 Precordial pain: Secondary | ICD-10-CM

## 2022-01-31 MED ORDER — NITROGLYCERIN 0.4 MG SL SUBL
SUBLINGUAL_TABLET | SUBLINGUAL | Status: AC
  Filled 2022-01-31: qty 2

## 2022-01-31 MED ORDER — NITROGLYCERIN 0.4 MG SL SUBL
0.8000 mg | SUBLINGUAL_TABLET | Freq: Once | SUBLINGUAL | Status: AC
  Administered 2022-01-31: 0.8 mg via SUBLINGUAL

## 2022-01-31 MED ORDER — IOHEXOL 350 MG/ML SOLN
100.0000 mL | Freq: Once | INTRAVENOUS | Status: AC | PRN
Start: 2022-01-31 — End: 2022-01-31
  Administered 2022-01-31: 100 mL via INTRAVENOUS

## 2022-02-03 ENCOUNTER — Telehealth: Payer: Self-pay | Admitting: Internal Medicine

## 2022-02-03 DIAGNOSIS — R002 Palpitations: Secondary | ICD-10-CM

## 2022-02-03 DIAGNOSIS — I10 Essential (primary) hypertension: Secondary | ICD-10-CM

## 2022-02-03 MED ORDER — SODIUM CHLORIDE 0.9% FLUSH: 3.0000 mL | Freq: Two times a day (BID) | INTRAVENOUS | Status: DC

## 2022-02-03 NOTE — Telephone Encounter (Signed)
Pt is returning call, he said, if he unable to answer to call him at [phone]

## 2022-02-03 NOTE — Telephone Encounter (Signed)
  Omar Ruiz and his wife were contacted this morning to discuss the results of his recent cardiac CTA.  Patient was also advised that he had a new fractured vertebrae at T10 that was an incidental finding.  I advised him to follow-up with his PCP regarding neck steps for treatment of his vertebral fracture.     During our conversation patient stated that his chest discomfort is continual and ongoing.  I advised that after contacting Dr. Glenford Bayley his recommendation was to proceed with left heart catheterization if his chest discomfort was still prevalent and present.  Patient was in agreement to proceed with catheterization at this time.  We discussed the risk and benefits which is documented below under shared decision making.  Omar Ruiz had no further questions at this time and both he and his wife thanked me for the call today. I will forward this note to the triage pool for scheduling.  Shared Decision Making/Informed Consent The risks [stroke (1 in 1000), death (1 in 1000), kidney failure [usually temporary] (1 in 500), bleeding (1 in 200), allergic reaction [possibly serious] (1 in 200)], benefits (diagnostic support and management of coronary artery disease) and alternatives of a cardiac catheterization were discussed in detail with Omar Ruiz and he is willing to proceed.    Ambrose Pancoast, NP

## 2022-02-03 NOTE — Telephone Encounter (Signed)
Omar Ruiz with [facility] called to alert Omar Pancoast NP the radiology over read of his Cardiac CT:   T10 vertebral body consistent with acute to subacute fracture.  Will forward to him for his review.

## 2022-02-03 NOTE — Addendum Note (Signed)
Addended by: Tempie Donning on: 02/03/2022 11:46 AM   Modules accepted: Orders

## 2022-02-03 NOTE — Telephone Encounter (Signed)
Left message for patient to call back  

## 2022-02-03 NOTE — Telephone Encounter (Signed)
Call report  

## 2022-02-03 NOTE — Telephone Encounter (Signed)
Spoke with the patient and have scheduled him for a LHC on 9/28 with Dr. Tamala Julian. Instructions have been reviewed. Patient will pick up printed copy when he comes by for lab work tomorrow 9/26.

## 2022-02-04 ENCOUNTER — Ambulatory Visit: Payer: Medicare PPO | Attending: Nurse Practitioner

## 2022-02-04 DIAGNOSIS — I10 Essential (primary) hypertension: Secondary | ICD-10-CM

## 2022-02-04 DIAGNOSIS — R002 Palpitations: Secondary | ICD-10-CM

## 2022-02-04 NOTE — Addendum Note (Signed)
Addended by: Antonieta Iba on: 02/04/2022 09:08 AM   Modules accepted: Orders

## 2022-02-05 ENCOUNTER — Telehealth: Payer: Self-pay | Admitting: *Deleted

## 2022-02-05 LAB — BASIC METABOLIC PANEL
BUN/Creatinine Ratio: 17 (ref 10–24)
BUN: 15 mg/dL (ref 8–27)
CO2: 27 mmol/L (ref 20–29)
Calcium: 9.5 mg/dL (ref 8.6–10.2)
Chloride: 100 mmol/L (ref 96–106)
Creatinine, Ser: 0.9 mg/dL (ref 0.76–1.27)
Glucose: 94 mg/dL (ref 70–99)
Potassium: 4.3 mmol/L (ref 3.5–5.2)
Sodium: 138 mmol/L (ref 134–144)
eGFR: 92 mL/min/{1.73_m2} (ref >59)

## 2022-02-05 LAB — CBC
Hematocrit: 40.9 % (ref 37.5–51.0)
Hemoglobin: 13.9 g/dL (ref 13.0–17.7)
MCH: 31.3 pg (ref 26.6–33.0)
MCHC: 34 g/dL (ref 31.5–35.7)
MCV: 92 fL (ref 79–97)
Platelets: 228 10*3/uL (ref 150–450)
RBC: 4.44 x10E6/uL (ref 4.14–5.80)
RDW: 14.3 % (ref 11.6–15.4)
WBC: 6 10*3/uL (ref 3.4–10.8)

## 2022-02-05 NOTE — H&P (Signed)
Prior mid LAD stent CP at rest and with activity Un-interpretable coronary CTA

## 2022-02-05 NOTE — Telephone Encounter (Signed)
Cardiac Catheterization scheduled at [facility] for: Thursday February 06, 2022 10:30 AM Arrival time and place: [place] Entrance A at: 8:30 AM  Nothing to eat after midnight prior to procedure, clear liquids until 5 AM day of procedure.   Medication instructions: -Usual morning medications can be taken with sips of water including aspirin 81 mg.  Confirmed patient has responsible adult to drive home post procedure and be with patient first 24 hours after arriving home.  Patient reports no new symptoms concerning for COVID-19 in the past 10 days.  Reviewed procedure instructions with patient.

## 2022-02-06 ENCOUNTER — Encounter (HOSPITAL_COMMUNITY)
Admission: RE | Disposition: A | Discharge status: Home / Self Care | Payer: Self-pay | Source: Home / Self Care | Attending: Interventional Cardiology

## 2022-02-06 ENCOUNTER — Other Ambulatory Visit: Payer: Self-pay

## 2022-02-06 ENCOUNTER — Ambulatory Visit (HOSPITAL_COMMUNITY)
Admission: RE | Admit: 2022-02-06 | Discharge: 2022-02-06 | Disposition: A | Discharge status: Home / Self Care | Payer: Medicare PPO | Attending: Interventional Cardiology | Admitting: Interventional Cardiology

## 2022-02-06 DIAGNOSIS — R002 Palpitations: Secondary | ICD-10-CM | POA: Diagnosis not present

## 2022-02-06 DIAGNOSIS — Z79899 Other long term (current) drug therapy: Secondary | ICD-10-CM | POA: Diagnosis not present

## 2022-02-06 DIAGNOSIS — I2510 Atherosclerotic heart disease of native coronary artery without angina pectoris: Secondary | ICD-10-CM | POA: Diagnosis not present

## 2022-02-06 DIAGNOSIS — E785 Hyperlipidemia, unspecified: Secondary | ICD-10-CM | POA: Insufficient documentation

## 2022-02-06 DIAGNOSIS — Z7982 Long term (current) use of aspirin: Secondary | ICD-10-CM | POA: Insufficient documentation

## 2022-02-06 DIAGNOSIS — Y832 Surgical operation with anastomosis, bypass or graft as the cause of abnormal reaction of the patient, or of later complication, without mention of misadventure at the time of the procedure: Secondary | ICD-10-CM | POA: Diagnosis not present

## 2022-02-06 DIAGNOSIS — R079 Chest pain, unspecified: Secondary | ICD-10-CM | POA: Diagnosis present

## 2022-02-06 DIAGNOSIS — I10 Essential (primary) hypertension: Secondary | ICD-10-CM | POA: Diagnosis not present

## 2022-02-06 DIAGNOSIS — T82855A Stenosis of coronary artery stent, initial encounter: Secondary | ICD-10-CM | POA: Insufficient documentation

## 2022-02-06 DIAGNOSIS — G4733 Obstructive sleep apnea (adult) (pediatric): Secondary | ICD-10-CM | POA: Insufficient documentation

## 2022-02-06 DIAGNOSIS — R072 Precordial pain: Secondary | ICD-10-CM

## 2022-02-06 DIAGNOSIS — Z955 Presence of coronary angioplasty implant and graft: Secondary | ICD-10-CM | POA: Diagnosis not present

## 2022-02-06 HISTORY — PX: LEFT HEART CATH AND CORONARY ANGIOGRAPHY: CATH118249

## 2022-02-06 SURGERY — LEFT HEART CATH AND CORONARY ANGIOGRAPHY
Anesthesia: LOCAL

## 2022-02-06 MED ORDER — ASPIRIN 81 MG PO CHEW: 81.0000 mg | CHEWABLE_TABLET | ORAL | Status: DC

## 2022-02-06 MED ORDER — FENTANYL CITRATE (PF) 100 MCG/2ML IJ SOLN
INTRAMUSCULAR | Status: AC
  Filled 2022-02-06: qty 2

## 2022-02-06 MED ORDER — ASPIRIN 81 MG PO CHEW: 81.0000 mg | CHEWABLE_TABLET | Freq: Every day | ORAL | Status: DC

## 2022-02-06 MED ORDER — LIDOCAINE HCL (PF) 1 % IJ SOLN
INTRAMUSCULAR | Status: AC
  Filled 2022-02-06: qty 30

## 2022-02-06 MED ORDER — OXYCODONE HCL 5 MG PO TABS: 5.0000 mg | ORAL_TABLET | ORAL | Status: DC | PRN

## 2022-02-06 MED ORDER — SODIUM CHLORIDE 0.9 % WEIGHT BASED INFUSION
3.0000 mL/kg/h | INTRAVENOUS | Status: AC
  Administered 2022-02-06: 3 mL/kg/h via INTRAVENOUS

## 2022-02-06 MED ORDER — VERAPAMIL HCL 2.5 MG/ML IV SOLN
INTRAVENOUS | Status: AC
  Filled 2022-02-06: qty 2

## 2022-02-06 MED ORDER — SODIUM CHLORIDE 0.9 % IV SOLN: 250.0000 mL | INTRAVENOUS | Status: DC | PRN

## 2022-02-06 MED ORDER — HEPARIN (PORCINE) IN NACL 1000-0.9 UT/500ML-% IV SOLN
INTRAVENOUS | Status: DC | PRN
  Administered 2022-02-06 (×2): 500 mL

## 2022-02-06 MED ORDER — SODIUM CHLORIDE 0.9 % IV SOLN: INTRAVENOUS | Status: DC

## 2022-02-06 MED ORDER — SODIUM CHLORIDE 0.9% FLUSH: 3.0000 mL | INTRAVENOUS | Status: DC | PRN

## 2022-02-06 MED ORDER — FENTANYL CITRATE (PF) 100 MCG/2ML IJ SOLN
INTRAMUSCULAR | Status: DC | PRN
  Administered 2022-02-06: 50 ug via INTRAVENOUS

## 2022-02-06 MED ORDER — ACETAMINOPHEN 325 MG PO TABS: 650.0000 mg | ORAL_TABLET | ORAL | Status: DC | PRN

## 2022-02-06 MED ORDER — LABETALOL HCL 5 MG/ML IV SOLN: 10.0000 mg | INTRAVENOUS | Status: DC | PRN

## 2022-02-06 MED ORDER — HEPARIN SODIUM (PORCINE) 1000 UNIT/ML IJ SOLN
INTRAMUSCULAR | Status: DC | PRN
  Administered 2022-02-06: 6000 [IU] via INTRAVENOUS

## 2022-02-06 MED ORDER — MIDAZOLAM HCL 2 MG/2ML IJ SOLN
INTRAMUSCULAR | Status: DC | PRN
  Administered 2022-02-06: 1 mg via INTRAVENOUS

## 2022-02-06 MED ORDER — HYDRALAZINE HCL 20 MG/ML IJ SOLN: 10.0000 mg | INTRAMUSCULAR | Status: DC | PRN

## 2022-02-06 MED ORDER — LIDOCAINE HCL (PF) 1 % IJ SOLN
INTRAMUSCULAR | Status: DC | PRN
  Administered 2022-02-06: 2 mL

## 2022-02-06 MED ORDER — SODIUM CHLORIDE 0.9% FLUSH: 3.0000 mL | Freq: Two times a day (BID) | INTRAVENOUS | Status: DC

## 2022-02-06 MED ORDER — VERAPAMIL HCL 2.5 MG/ML IV SOLN
INTRAVENOUS | Status: DC | PRN
  Administered 2022-02-06: 10 mL via INTRA_ARTERIAL

## 2022-02-06 MED ORDER — HEPARIN SODIUM (PORCINE) 1000 UNIT/ML IJ SOLN
INTRAMUSCULAR | Status: AC
  Filled 2022-02-06: qty 10

## 2022-02-06 MED ORDER — ONDANSETRON HCL 4 MG/2ML IJ SOLN: 4.0000 mg | Freq: Four times a day (QID) | INTRAMUSCULAR | Status: DC | PRN

## 2022-02-06 MED ORDER — MIDAZOLAM HCL 2 MG/2ML IJ SOLN
INTRAMUSCULAR | Status: AC
  Filled 2022-02-06: qty 2

## 2022-02-06 MED ORDER — SODIUM CHLORIDE 0.9 % WEIGHT BASED INFUSION: 1.0000 mL/kg/h | INTRAVENOUS | Status: DC

## 2022-02-06 SURGICAL SUPPLY — 10 items
BAND CMPR LRG ZPHR (HEMOSTASIS) ×1
BAND ZEPHYR COMPRESS 30 LONG (HEMOSTASIS) IMPLANT
CATH 5FR JL3.5 JR4 ANG PIG MP (CATHETERS) IMPLANT
GLIDESHEATH SLEND A-KIT 6F 22G (SHEATH) IMPLANT
GUIDEWIRE INQWIRE 1.5J.035X260 (WIRE) IMPLANT
INQWIRE 1.5J .035X260CM (WIRE) ×1
KIT HEART LEFT (KITS) ×2 IMPLANT
PACK CARDIAC CATHETERIZATION (CUSTOM PROCEDURE TRAY) ×2 IMPLANT
SET ATX SIMPLICITY (MISCELLANEOUS) IMPLANT
TUBING CIL FLEX 10 FLL-RA (TUBING) ×2 IMPLANT

## 2022-02-06 NOTE — CV Procedure (Signed)
The coronaries are widely patent.  Right dominant coronary anatomy. The LAD contains luminal irregularities.  The mid LAD stent is widely patent. LV function is normal with EF 55%.  EDP 15 mmHg.

## 2022-02-06 NOTE — Interval H&P Note (Signed)
Cath Lab Visit (complete for each Cath Lab visit)  Clinical Evaluation Leading to the Procedure:   ACS: No.  Non-ACS:    Anginal Classification: CCS II  Anti-ischemic medical therapy: Minimal Therapy (1 class of medications)  Non-Invasive Test Results: No non-invasive testing performed  Prior CABG: No previous CABG      History and Physical Interval Note:  02/06/2022 12:35 PM  Omar Ruiz  has presented today for surgery, with the diagnosis of chest pain.  The various methods of treatment have been discussed with the patient and family. After consideration of risks, benefits and other options for treatment, the patient has consented to  Procedure(s): LEFT HEART CATH AND CORONARY ANGIOGRAPHY (N/A) as a surgical intervention.  The patient's history has been reviewed, patient examined, no change in status, stable for surgery.  I have reviewed the patient's chart and labs.  Questions were answered to the patient's satisfaction.     Belva Crome III

## 2022-02-07 ENCOUNTER — Encounter (HOSPITAL_COMMUNITY): Payer: Self-pay | Admitting: Interventional Cardiology

## 2022-02-28 ENCOUNTER — Other Ambulatory Visit: Payer: Self-pay

## 2022-02-28 ENCOUNTER — Encounter: Payer: Self-pay | Admitting: Internal Medicine

## 2022-02-28 ENCOUNTER — Ambulatory Visit: Payer: Medicare PPO | Attending: Internal Medicine | Admitting: Internal Medicine

## 2022-02-28 VITALS — BP 142/80 | HR 73 | Ht 72.0 in | Wt 255.8 lb

## 2022-02-28 DIAGNOSIS — T466X5A Adverse effect of antihyperlipidemic and antiarteriosclerotic drugs, initial encounter: Secondary | ICD-10-CM

## 2022-02-28 DIAGNOSIS — Z9861 Coronary angioplasty status: Secondary | ICD-10-CM

## 2022-02-28 DIAGNOSIS — E782 Mixed hyperlipidemia: Secondary | ICD-10-CM

## 2022-02-28 DIAGNOSIS — I2510 Atherosclerotic heart disease of native coronary artery without angina pectoris: Secondary | ICD-10-CM | POA: Diagnosis not present

## 2022-02-28 DIAGNOSIS — R0609 Other forms of dyspnea: Secondary | ICD-10-CM

## 2022-02-28 DIAGNOSIS — M791 Myalgia, unspecified site: Secondary | ICD-10-CM

## 2022-02-28 DIAGNOSIS — I700 Atherosclerosis of aorta: Secondary | ICD-10-CM

## 2022-02-28 DIAGNOSIS — I10 Essential (primary) hypertension: Secondary | ICD-10-CM

## 2022-02-28 MED ORDER — METOPROLOL SUCCINATE ER 25 MG PO TB24: 37.5000 mg | ORAL_TABLET | Freq: Every day | ORAL | 3 refills | Status: DC

## 2022-02-28 MED ORDER — EZETIMIBE 10 MG PO TABS: 10.0000 mg | ORAL_TABLET | Freq: Every day | ORAL | 3 refills | Status: DC

## 2022-02-28 NOTE — Patient Instructions (Signed)
Medication Instructions:  Your physician has recommended you make the following change in your medication:  START: ezetimibe (Zetia) 10 mg by mouth once daily  INCREASE: metoprolol succinate (Toprol-XL) to 37.5 mg by mouth once daily  *If you need a refill on your cardiac medications before your next appointment, please call your pharmacy*   Lab Work: IN 3 MONTHS IN [place]: FLP, ALT ( nothing to eat or drink 8-12 hours before except water and black coffee)  If you have labs (blood work) drawn today and your tests are completely normal, you will receive your results only by: [place] (if you have MyChart) OR A paper copy in the mail If you have any lab test that is abnormal or we need to change your treatment, we will call you to review the results.   Testing/Procedures: NONE   Follow-Up: At [facility], you and your health needs are our priority.  As part of our continuing mission to provide you with exceptional heart care, we have created designated Provider Care Teams.  These Care Teams include your primary Cardiologist (physician) and Advanced Practice Providers (APPs -  Physician Assistants and Nurse Practitioners) who all work together to provide you with the care you need, when you need it.  We recommend signing up for the patient portal called "MyChart".  Sign up information is provided on this After Visit Summary.  MyChart is used to connect with patients for Virtual Visits (Telemedicine).  Patients are able to view lab/test results, encounter notes, upcoming appointments, etc.  Non-urgent messages can be sent to your provider as well.   To learn more about what you can do with MyChart, go to [email or web address].    Your next appointment:   3-4 month(s)  The format for your next appointment:   In Person  Provider:   You will see one of the following Advanced Practice Providers on your designated Care Team:   Bernerd Pho, [place]    [place]

## 2022-02-28 NOTE — Progress Notes (Signed)
Cardiology Office Note:    Date:  02/28/2022   ID:  TRIPP GOINS, DOB 01/04/1951, MRN [id number]  PCP:  [name], NP   [facility] Providers Cardiologist:  Werner Lean, MD     Referring MD: [name], NP   CC: F/u Cath  History of Present Illness:    Omar Ruiz is a 71 y.o. male with a hx of CAD s/p 2011 LAD DES, HTN, HLD, and OSA. Seen in [place] in 2023.   2023: Has stroke in interim.  Has CP with APP f/u.  Then had chest pain.  A CCTA was performed despite having prior PCI.  Then had Cath.  No worsening obstruction disease.  Seen in f/u.  Patient notes that he is doing ok Since last visit notes that he still has rare chest pain.  Has rare palpitations.  Did not increase the BB. He does not take the ntiro because he doesn't always keep it with him.  Has used in once in 10 + years There are no interval hospital/ED visit.    No SOB/DOE and no PND/Orthopnea.  No weight gain or leg swelling.  No palpitations or syncope.  Was able to go to the beach with his wife.  Ambulatory blood pressure 144/81.  Past Medical History:  Diagnosis Date   Arthritis    CAD (coronary artery disease)    a. s/p DES to LAD in 2011 with low-risk NST in 02/2017   Cancer [facility])    skin cancer on hand, cancer removed   Chest pain 07/2004   2D Echo EF>55%   Hyperlipemia    Hypertension 10/2004   stress test EF 57%   Obesity    Palpitation    Sleep apnea     Past Surgical History:  Procedure Laterality Date   COLONOSCOPY  10/2004   COLONOSCOPY N/A 03/29/2013   Procedure: COLONOSCOPY;  Surgeon: [name], MD;  Location: [facility];  Service: Gastroenterology;  Laterality: N/A;   LEFT HEART CATH AND CORONARY ANGIOGRAPHY N/A 02/06/2022   Procedure: LEFT HEART CATH AND CORONARY ANGIOGRAPHY;  Surgeon: Belva Crome, MD;  Location: [place] [facility];  Service: Cardiovascular;  Laterality: N/A;   MOUTH SURGERY     NASAL SINUS SURGERY     SKIN CANCER  EXCISION     right hand    STENTS  09/05/2009   3.0x16m promus drug eluting stent for a 90% mid LAD artery stenosis   TOTAL KNEE ARTHROPLASTY Right 08/27/2020   Procedure: TOTAL KNEE ARTHROPLASTY;  Surgeon: [name] MD;  Location: [facility];  Service: Orthopedics;  Laterality: Right;  569m   TOTAL SHOULDER ARTHROPLASTY Right 12/24/2017   Procedure: RIGHT TOTAL SHOULDER ARTHROPLASTY;  Surgeon: ChTania AdeMD;  [place] [facility] Service: [place]ics;  Laterality: Right;    Current Medications: Current Meds  Medication Sig   acetaminophen (TYLENOL) 500 MG tablet Take 1,000 mg by mouth daily as needed for moderate pain or mild pain.   albuterol (VENTOLIN HFA) 108 (90 Base) MCG/ACT inhaler Inhale 2 puffs into the lungs every 6 (six) hours as needed for wheezing or shortness of breath.   aspirin EC 81 MG tablet Take 81 mg by mouth daily. Swallow whole.   cholecalciferol (VITAMIN D3) 25 MCG (1000 UNIT) tablet Take 1,000 Units by mouth daily.   ezetimibe (ZETIA) 10 MG tablet Take 1 tablet (10 mg total) by mouth daily.   gabapentin (NEURONTIN) 100 MG capsule Take 100 mg by  mouth daily.   metoprolol succinate (TOPROL XL) 25 MG 24 hr tablet Take 1.5 tablets (37.5 mg total) by mouth daily.   Multiple Vitamin (MULTIVITAMIN WITH MINERALS) TABS tablet Take 1 tablet by mouth daily.   naproxen (NAPROSYN) 500 MG tablet Take 500 mg by mouth 2 (two) times daily.   nitroGLYCERIN (NITROSTAT) 0.4 MG SL tablet Place 1 tablet (0.4 mg total) under the tongue every 5 (five) minutes as needed for chest pain.   sertraline (ZOLOFT) 25 MG tablet Take 25 mg by mouth in the morning.   simvastatin (ZOCOR) 40 MG tablet Take 40 mg by mouth in the morning.   [DISCONTINUED] metoprolol succinate (TOPROL-XL) 25 MG 24 hr tablet TAKE 1 TABLET(25 MG) BY MOUTH DAILY   Current Facility-Administered Medications for the 02/28/22 encounter (Office Visit) with Werner Lean, MD  Medication   sodium chloride flush  (NS) 0.9 % injection 3 mL     Allergies:   Patient has no known allergies.   Social History   Socioeconomic History   Marital status: Married    Spouse name: Not on file   Number of children: Not on file   Years of education: 12   Highest education level: Not on file  Occupational History   Not on file  Tobacco Use   Smoking status: Former    Types: Cigarettes    Quit date: 05/12/1981    Years since quitting: 40.8   Smokeless tobacco: Never  Vaping Use   Vaping Use: Never used  Substance and Sexual Activity   Alcohol use: Yes    Alcohol/week: 12.0 standard drinks of alcohol    Types: 12 Cans of beer per week   Drug use: No   Sexual activity: Yes  Other Topics Concern   Not on file  Social History Narrative   Not on file   Social Determinants of Health   Financial Resource Strain: Not on file  Food Insecurity: Not on file  Transportation Needs: Not on file  Physical Activity: Not on file  Stress: Not on file  Social Connections: Not on file     Family History: The patient's family history includes Asthma in an other family member; CAD in his brother; Diabetes in his mother and another family member; Heart disease in his father; Lung disease in an other family member.  ROS:   Please see the history of present illness.     All other systems reviewed and are negative.  EKGs/Labs/Other Studies Reviewed:    The following studies were reviewed today:  EKG:   07/16/21: SR rate 74  LEFT HEART CATH AND CORONARY ANGIOGRAPHY 02/06/2022  Narrative CONCLUSIONS: Left main was normal Mid LAD contains de novo segmental 30 to 40% narrowing.  LAD stent contains distal 30% in-stent restenosis.  LAD does not contain any obstructive disease. Circumflex contains 40% ostial to proximal narrowing and 30% distal narrowing.  No obstructive disease is noted. RCA is dominant and widely patent. LV function is normal.  EF is 55%.  EDP is normal.  RECOMMENDATIONS: Continue preventive  therapy and risk factor modification.   No results found for this or any previous visit from the past 3650 days.   No results found for this or any previous visit from the past 3650 days.   NM Myocar Multi W/Spect W/Wall Motion / EF 02/23/2017  Narrative  No diagnostic ST segment changes to indicate ischemia. No arrhythmias noted. Low risk Duke treadmill score of 7.  No significant myocardial perfusion defects  to indicate scar or ischemia.  This is a low risk study.  Nuclear stress EF: 63%.   Exercise Tolerance Test 12/22/2019  Narrative  Blood pressure demonstrated a hypertensive response to exercise.  Upsloping ST segment depression ST segment depression was noted during stress in the V4, V5 and V6 leads.  Negative exercise stress test for ischemia  Duke treadmill score of at lest 5 supporting low risk for major cardiac events. Did not reach true max exercise capacity, test stopped due to knee pain.   ECHO COMPLETE WO IMAGING ENHANCING AGENT 03/23/2017  Narrative *[facility]* [address], [place] [address] [phone]  ------------------------------------------------------------------- Transthoracic Echocardiography  Patient:    Vallen, Calabrese MR #:       [id number] Study Date: 03/23/2017 Gender:     M Age:        61 Height:     185.4 cm Weight:     113.4 kg BSA:        2.45 m^2 Pt. Status: Room:  [place], MD [place], MD [place], MD PERFORMING   [facility], [facility] SONOGRAPHER  Alvino Chapel, RCS  cc:  ------------------------------------------------------------------- LV EF: 60% -   65%  ------------------------------------------------------------------- History:   PMH:   Coronary artery disease.  Chronic obstructive pulmonary disease.  Risk factors:  Hypertension. Dyslipidemia.  ------------------------------------------------------------------- Study  Conclusions  - Left ventricle: The cavity size was normal. Systolic function was normal. The estimated ejection fraction was in the range of 60% to 65%. Wall motion was normal; there were no regional wall motion abnormalities. Doppler parameters are consistent with abnormal left ventricular relaxation (grade 1 diastolic dysfunction). Mild focal basal septal hypertrophy.  ------------------------------------------------------------------- Labs, prior tests, procedures, and surgery: Stent in 09/05/2009.  ------------------------------------------------------------------- Study data:  No prior study was available for comparison.  Study status:  Routine.  Procedure:  The patient reported no pain pre or post test. Transthoracic echocardiography. Image quality was adequate.  Study completion:  There were no complications. Transthoracic echocardiography.  M-mode, complete 2D, spectral Doppler, and color Doppler.  Birthdate:  Patient birthdate: [birth year].  Age:  Patient is 71 yr old.  Sex:  Gender: male. BMI: 33 kg/m^2.  Blood pressure:     148/87  Patient status: Inpatient.  Study date:  Study date: 03/23/2017. Study time: 11:32 AM.  Location:  Echo laboratory.  -------------------------------------------------------------------  ------------------------------------------------------------------- Left ventricle:  The cavity size was normal. Systolic function was normal. The estimated ejection fraction was in the range of 60% to 65%. Wall motion was normal; there were no regional wall motion abnormalities. Mild focal basal septal hypertrophy. Doppler parameters are consistent with abnormal left ventricular relaxation (grade 1 diastolic dysfunction). There was no evidence of elevated ventricular filling pressure by Doppler parameters.  ------------------------------------------------------------------- Aortic valve:   Trileaflet.  Doppler:   There was no stenosis. There was no  regurgitation.  ------------------------------------------------------------------- Aorta:  Aortic root: The aortic root was normal in size.  ------------------------------------------------------------------- Mitral valve:   Structurally normal valve.   Leaflet separation was normal.  Doppler:  Transvalvular velocity was within the normal range. There was no evidence for stenosis. There was trivial regurgitation.  ------------------------------------------------------------------- Left atrium:  The atrium was normal in size.  ------------------------------------------------------------------- Atrial septum:  No defect or patent foramen ovale was identified.  ------------------------------------------------------------------- Right ventricle:  The cavity size was normal. Wall thickness was normal. Systolic function was normal.  -------------------------------------------------------------------  Pulmonic valve:    The valve appears to be grossly normal. Doppler:  There was no significant regurgitation.  ------------------------------------------------------------------- Tricuspid valve:   Structurally normal valve.   Leaflet separation was normal.  Doppler:  Transvalvular velocity was within the normal range. There was no regurgitation.  ------------------------------------------------------------------- Right atrium:  The atrium was normal in size.  ------------------------------------------------------------------- Pericardium:  There was no pericardial effusion.  ------------------------------------------------------------------- Systemic veins: Inferior vena cava: The vessel was normal in size. The respirophasic diameter changes were in the normal range (>= 50%), consistent with normal central venous pressure.  ------------------------------------------------------------------- Measurements  Left ventricle                           Value        Reference LV ID, ED,  PLAX chordal                  47.6  mm     43 - 52 LV ID, ES, PLAX chordal                  28.9  mm     23 - 38 LV fx shortening, PLAX chordal           39    %      >=29 LV PW thickness, ED                      9.9   mm     ---------- IVS/LV PW ratio, ED                      1.19         <=1.3 Stroke volume, 2D                        108   ml     ---------- Stroke volume/bsa, 2D                    44    ml/m^2 ---------- LV ejection fraction, 1-p A4C            60    %      ---------- LV end-diastolic volume, 2-p             109   ml     ---------- LV end-systolic volume, 2-p              38    ml     ---------- LV ejection fraction, 2-p                66    %      ---------- Stroke volume, 2-p                       72    ml     ---------- LV end-diastolic volume/bsa, 2-p         45    ml/m^2 ---------- LV end-systolic volume/bsa, 2-p          15    ml/m^2 ---------- Stroke volume/bsa, 2-p                   29.2  ml/m^2 ---------- LV e&', lateral                           8.81  cm/s   ----------  LV E/e&', lateral                         7.01         ---------- LV e&', medial                            7.18  cm/s   ---------- LV E/e&', medial                          8.61         ---------- LV e&', average                           8     cm/s   ---------- LV E/e&', average                         7.73         ----------  Ventricular septum                       Value        Reference IVS thickness, ED                        11.8  mm     ----------  LVOT                                     Value        Reference LVOT ID, S                               24    mm     ---------- LVOT area                                4.52  cm^2   ---------- LVOT peak velocity, S                    83.9  cm/s   ---------- LVOT mean velocity, S                    53.7  cm/s   ---------- LVOT VTI, S                              23.8  cm     ---------- LVOT peak gradient, S                    3     mm Hg   ----------  Aorta                                    Value        Reference Aortic root ID, ED                       36    mm     ----------  Left atrium  Value        Reference LA ID, A-P, ES                           33    mm     ---------- LA ID/bsa, A-P                           1.35  cm/m^2 <=2.2 LA volume, S                             71.4  ml     ---------- LA volume/bsa, S                         29.2  ml/m^2 ---------- LA volume, ES, 1-p A4C                   66.2  ml     ---------- LA volume/bsa, ES, 1-p A4C               27    ml/m^2 ---------- LA volume, ES, 1-p A2C                   70    ml     ---------- LA volume/bsa, ES, 1-p A2C               28.6  ml/m^2 ----------  Mitral valve                             Value        Reference Mitral E-wave peak velocity              61.8  cm/s   ---------- Mitral A-wave peak velocity              60    cm/s   ---------- Mitral deceleration time         (H)     264   ms     150 - 230 Mitral E/A ratio, peak                   1            ----------  Right atrium                             Value        Reference RA ID, S-I, ES, A4C              (H)     53    mm     34 - 49 RA area, ES, A4C                         16.3  cm^2   8.3 - 19.5 RA volume, ES, A/L                       44.8  ml     ---------- RA volume/bsa, ES, A/L                   18.3  ml/m^2 ----------  Systemic veins  Value        Reference Estimated CVP                            8     mm Hg  ----------  Right ventricle                          Value        Reference RV ID, ED, PLAX                          35    mm     19 - 38  Legend: (L)  and  (H)  mark values outside specified reference range.  ------------------------------------------------------------------- Prepared and Electronically Authenticated by  Kate Sable, MD 2018-11-12T12:42:42   No results found for this or any previous visit from  the past 3650 days.   No results found for this or any previous visit from the past 3650 days.   LONG TERM MONITOR (8-14 DAYS) INTERPRETATION 02/05/2022  Narrative   Patient had a minimum heart rate of 53 bpm, maximum heart rate of 197 bpm, and average heart rate of 73 bpm.   Predominant underlying rhythm was sinus rhythm.   Paroxysmal SVT lasting 17 seconds at longest.   Isolated PACs were rare (<1.0%).   Isolated PVCs were rare (<1.0%).   Triggered and diary events associated with sinus rhythm and PVCs.  Asymptomatic SVT    CT CORONARY MORPH W/CTA COR W/SCORE W/CA W/CM &/OR WO/CM 01/31/2022  Addendum 02/03/2022  8:19 AM ADDENDUM REPORT: 02/03/2022 08:17  EXAM: OVER-READ INTERPRETATION  CT CHEST  The following report is an over-read performed by radiologist Dr. Sabino Dick [facility] on 02/03/2022. This over-read does not include interpretation of cardiac or coronary anatomy or pathology. The coronary CTA interpretation by the cardiologist is attached.  COMPARISON:  January 04, 2022.  FINDINGS: No significant abnormality seen involving the visualized portions of the extracardiac vascular structures. Visualized mediastinum is unremarkable. Visualized portion of upper abdomen is unremarkable. Visualized pulmonary parenchyma is unremarkable. There is interval development of mild compression deformity and increased sclerosis involving the T10 vertebral body consistent with acute to subacute fracture.  IMPRESSION: Interval development of mild compression deformity and increased sclerosis of T10 vertebral body consistent with acute to subacute fracture. These results will be called to the ordering clinician or representative by the Radiologist Assistant, and communication documented in the PACS or zVision Dashboard.   Electronically Signed By: Marijo Conception M.D. On: 02/03/2022 08:17  Narrative CLINICAL DATA:  43M with chest  pain  EXAM: Cardiac/Coronary CTA  TECHNIQUE: The patient was scanned on a [employer].  FINDINGS: A 100 kV prospective scan was triggered in the descending thoracic aorta at 111 HU's. Axial non-contrast 3 mm slices were carried out through the heart. The data set was analyzed on a dedicated work station and scored using the [place]. Gantry rotation speed was 250 msecs and collimation was .6 mm. 0.8 mg of sl NTG was given. The 3D data set was reconstructed in 5% intervals of the 35-75 % of the R-R cycle. Phases were analyzed on a dedicated work station using MPR, MIP and VRT modes. The patient received 80 cc of contrast.  Coronary Arteries:  Normal coronary origin.  Right dominance.  RCA is a large dominant artery that gives rise to  PDA and PLA. Step artifact at ostium of RCA and in proximal RCA. Mixed plaque in proximal RCA causes 24-49% stenosis. Mixed plaque in distal RCA causes 25-49% stenosis.  Left main is a large artery that gives rise to LAD and LCX arteries.  LAD is a large vessel. Mixed plaque in proximal to mid LAD causes 50-69% stenosis. High risk plaque features including positive remodeling, napkin ring sign, and low attenuation plaque. There is a stent in the mid LAD; stent appears patent but unable to evaluate for stenosis within stent given small caliber stent. Recommend functional evaluation for ischemia.  LCX is a non-dominant artery that gives rise to one large OM1 branch. Mixed plaque in proximal LCX causes 25-49% stenosis. High risk plaque features including positive remodeling, napkin ring sign, and low attenuation plaque. Step artifact in proximal LCX. Mixed plaque in mid LCX causes 25-49% stenosis  Other findings:  Left Ventricle: Normal size  Left Atrium: Mild enlargement  Pulmonary Veins: Normal configuration  Right Ventricle: Moderate enlargement  Right Atrium: Moderate enlargement  Cardiac valves: No  calcifications  Thoracic aorta: Normal size  Pulmonary Arteries: Normal size  Systemic Veins: Normal drainage  Pericardium: Normal thickness  IMPRESSION: 1. Coronary calcium score not performed given coronary stent  2. Normal coronary origin with right dominance.  3. Unable to send study for CTFFR due to presence of coronary stent  4. There is a stent in the mid LAD; stent appears patent but unable to exclude stenosis within stent given its size.  5. Mixed plaque in proximal to mid LAD causes moderate (50-69%) stenosis, with high risk plaque features including positive remodeling, napkin ring sign, and low attenuation plaque.  6. Mixed plaque in proximal LCX causes mild (25-49%) stenosis, with high risk plaque features including positive remodeling, napkin ring sign, and low attenuation plaque. Step artifact prevents interpretation of part of proximal LCX. Mixed plaque in mid LCX causes mild (25-49%) stenosis  7. Step artifact prevents interpretation of ostium of RCA and part of proximal RCA. Mixed plaque in proximal RCA causes mild (24-49%) stenosis. Mixed plaque in distal RCA causes mild (25-49%) stenosis.  8. Given step artifacts preventing interpretation of certain coronary segments, and considering cannot exclude stenosis in mid LAD stent due to its size, recommend alternative evaluation for ischemia such as nuclear stress testing  CAD-RADS N Non-diagnostic study. Obstructive CAD can't be excluded. Alternative evaluation is recommended.  Electronically Signed: By: Oswaldo Milian M.D. On: 02/02/2022 20:27     Recent Labs: 09/13/2021: Magnesium 2.1; TSH 3.824 01/04/2022: ALT 24 02/04/2022: BUN 15; Creatinine, Ser 0.90; Hemoglobin 13.9; Platelets 228; Potassium 4.3; Sodium 138  Recent Lipid Panel    Component Value Date/Time   CHOL 146 09/13/2021 0356   TRIG 98 09/13/2021 0356   HDL 42 09/13/2021 0356   CHOLHDL 3.5 09/13/2021 0356   VLDL 20 09/13/2021  0356   LDLCALC 84 09/13/2021 0356        Physical Exam:    VS:  BP (!) 142/80   Pulse 73   Ht 6' (1.829 m)   Wt 255 lb 12.8 oz (116 kg)   SpO2 96%   BMI 34.69 kg/m     Wt Readings from Last 3 Encounters:  02/28/22 255 lb 12.8 oz (116 kg)  02/06/22 255 lb (115.7 kg)  01/17/22 255 lb 12.8 oz (116 kg)    Gen: No distress Neck: No JVD,  Ears: bilateral Frank Sign Cardiac: No Rubs or Gallops, no murmur, RRR, +2 radial  pulses Respiratory: Clear to auscultation bilaterally, normal effort, normal  respiratory rate GI: Soft, nontender, non-distended  MS: no edema;  moves all extremities Integument: Skin feels warm Neuro:  At time of evaluation, alert and oriented to person/place/time/situation  Psych: Normal affect, patient feels well   ASSESSMENT:    1. Coronary artery disease involving native coronary artery of native heart without angina pectoris   2. Mixed hyperlipidemia   3. CAD S/P percutaneous coronary angioplasty   4. Essential hypertension   5. Dyspnea on exertion   6. Aortic atherosclerosis ([place])   7. Myalgia due to statin     PLAN:    Coronary Artery Disease; Obstructive HTN HLD OSA no long on CPAP Mild aortic dilation Statin myalgia on crestor - will increase to succinate 37.5 mg PO daily, if still having CP start Imdur 30 mg - anatomy: obstructive LAD s/p PCI - continue ASA 81 mg - continue statin, goal LDL < 55, will start zetia 10 mg and labs in 3/4 months; continue simvastatin - continue nitrates as PRN and gave education  4 months with [place] APP then either me or change to [place] MD   Medication Adjustments/Labs and Tests Ordered: Current medicines are reviewed at length with the patient today.  Concerns regarding medicines are outlined above.  Orders Placed This Encounter  Procedures   Lipid panel   ALT   Meds ordered this encounter  Medications   ezetimibe (ZETIA) 10 MG tablet    Sig: Take 1 tablet (10 mg total) by mouth daily.     Dispense:  90 tablet    Refill:  3   metoprolol succinate (TOPROL XL) 25 MG 24 hr tablet    Sig: Take 1.5 tablets (37.5 mg total) by mouth daily.    Dispense:  135 tablet    Refill:  3    Patient Instructions  Medication Instructions:  Your physician has recommended you make the following change in your medication:  START: ezetimibe (Zetia) 10 mg by mouth once daily  INCREASE: metoprolol succinate (Toprol-XL) to 37.5 mg by mouth once daily  *If you need a refill on your cardiac medications before your next appointment, please call your pharmacy*   Lab Work: IN 3 MONTHS IN [place]: FLP, ALT ( nothing to eat or drink 8-12 hours before except water and black coffee)  If you have labs (blood work) drawn today and your tests are completely normal, you will receive your results only by: [place] (if you have MyChart) OR A paper copy in the mail If you have any lab test that is abnormal or we need to change your treatment, we will call you to review the results.   Testing/Procedures: NONE   Follow-Up: At [facility], you and your health needs are our priority.  As part of our continuing mission to provide you with exceptional heart care, we have created designated Provider Care Teams.  These Care Teams include your primary Cardiologist (physician) and Advanced Practice Providers (APPs -  Physician Assistants and Nurse Practitioners) who all work together to provide you with the care you need, when you need it.  We recommend signing up for the patient portal called "MyChart".  Sign up information is provided on this After Visit Summary.  MyChart is used to connect with patients for Virtual Visits (Telemedicine).  Patients are able to view lab/test results, encounter notes, upcoming appointments, etc.  Non-urgent messages can be sent to your provider as well.   To learn more  about what you can do with MyChart, go to [email or web address].    Your next  appointment:   3-4 month(s)  The format for your next appointment:   In Person  Provider:   You will see one of the following Advanced Practice Providers on your designated Care Team:   Bernerd Pho, [place]    Important Information About Sugar         Signed, Werner Lean, MD  02/28/2022 10:42 AM    [place]

## 2022-03-03 ENCOUNTER — Other Ambulatory Visit: Payer: Self-pay | Admitting: Otolaryngology

## 2022-03-03 ENCOUNTER — Encounter (HOSPITAL_BASED_OUTPATIENT_CLINIC_OR_DEPARTMENT_OTHER): Payer: Self-pay | Admitting: Otolaryngology

## 2022-03-03 NOTE — Progress Notes (Signed)
   03/03/22 1058  PAT Phone Screen  Do You Have Diabetes? No  Do You Have Hypertension? Yes  Have You Ever Been to the ER for Asthma? No  Have You Taken Oral Steroids in the Past 3 Months? No  Do you Take Phenteramine or any Other Diet Drugs? No  Recent  Lab Work, EKG, CXR? Yes  Where was this test performed? in epic  Do you have a history of heart problems? Cardiologist;Yes  Have You Ever Had Tests on Your Heart? Yes  Where? in epic, cath 01/2022 [facility], echo 09/2021, LT monitor 10/23, EKG 01/2022  Any Recent Hospitalizations? No  Height 6' (1.829 m)  Weight 115.7 kg  Pat Appointment Scheduled No  Reason for No Appointment Not Needed

## 2022-03-04 ENCOUNTER — Ambulatory Visit (HOSPITAL_BASED_OUTPATIENT_CLINIC_OR_DEPARTMENT_OTHER): Payer: Medicare PPO | Admitting: Anesthesiology

## 2022-03-04 ENCOUNTER — Encounter (HOSPITAL_BASED_OUTPATIENT_CLINIC_OR_DEPARTMENT_OTHER)
Admission: RE | Disposition: A | Discharge status: Home / Self Care | Payer: Self-pay | Source: Home / Self Care | Attending: Otolaryngology

## 2022-03-04 ENCOUNTER — Other Ambulatory Visit: Payer: Self-pay

## 2022-03-04 ENCOUNTER — Encounter (HOSPITAL_BASED_OUTPATIENT_CLINIC_OR_DEPARTMENT_OTHER): Payer: Self-pay | Admitting: Otolaryngology

## 2022-03-04 ENCOUNTER — Ambulatory Visit (HOSPITAL_COMMUNITY)
Admission: RE | Admit: 2022-03-04 | Discharge: 2022-03-04 | Disposition: A | Discharge status: Home / Self Care | Payer: Medicare PPO | Attending: Otolaryngology | Admitting: Otolaryngology

## 2022-03-04 DIAGNOSIS — Z9989 Dependence on other enabling machines and devices: Secondary | ICD-10-CM

## 2022-03-04 DIAGNOSIS — I10 Essential (primary) hypertension: Secondary | ICD-10-CM | POA: Diagnosis not present

## 2022-03-04 DIAGNOSIS — M1990 Unspecified osteoarthritis, unspecified site: Secondary | ICD-10-CM | POA: Diagnosis not present

## 2022-03-04 DIAGNOSIS — Z79899 Other long term (current) drug therapy: Secondary | ICD-10-CM | POA: Diagnosis not present

## 2022-03-04 DIAGNOSIS — Z87891 Personal history of nicotine dependence: Secondary | ICD-10-CM | POA: Diagnosis not present

## 2022-03-04 DIAGNOSIS — Z6834 Body mass index (BMI) 34.0-34.9, adult: Secondary | ICD-10-CM | POA: Diagnosis not present

## 2022-03-04 DIAGNOSIS — G4733 Obstructive sleep apnea (adult) (pediatric): Secondary | ICD-10-CM | POA: Insufficient documentation

## 2022-03-04 DIAGNOSIS — M4802 Spinal stenosis, cervical region: Secondary | ICD-10-CM | POA: Insufficient documentation

## 2022-03-04 DIAGNOSIS — I2510 Atherosclerotic heart disease of native coronary artery without angina pectoris: Secondary | ICD-10-CM

## 2022-03-04 DIAGNOSIS — Z955 Presence of coronary angioplasty implant and graft: Secondary | ICD-10-CM | POA: Diagnosis not present

## 2022-03-04 DIAGNOSIS — J449 Chronic obstructive pulmonary disease, unspecified: Secondary | ICD-10-CM | POA: Insufficient documentation

## 2022-03-04 HISTORY — PX: DRUG INDUCED ENDOSCOPY: SHX6808

## 2022-03-04 SURGERY — DRUG INDUCED SLEEP ENDOSCOPY
Anesthesia: General | Site: Nose | Laterality: Bilateral

## 2022-03-04 MED ORDER — OXYMETAZOLINE HCL 0.05 % NA SOLN
NASAL | Status: DC | PRN
  Administered 2022-03-04: 1 via TOPICAL

## 2022-03-04 MED ORDER — PROPOFOL 500 MG/50ML IV EMUL
INTRAVENOUS | Status: DC | PRN
  Administered 2022-03-04: 100 ug/kg/min via INTRAVENOUS

## 2022-03-04 MED ORDER — PROMETHAZINE HCL 25 MG/ML IJ SOLN: 6.2500 mg | INTRAMUSCULAR | Status: DC | PRN

## 2022-03-04 MED ORDER — LIDOCAINE-EPINEPHRINE 1 %-1:100000 IJ SOLN
INTRAMUSCULAR | Status: AC
  Filled 2022-03-04: qty 2

## 2022-03-04 MED ORDER — FENTANYL CITRATE (PF) 100 MCG/2ML IJ SOLN: 25.0000 ug | INTRAMUSCULAR | Status: DC | PRN

## 2022-03-04 MED ORDER — OXYMETAZOLINE HCL 0.05 % NA SOLN
NASAL | Status: AC
  Filled 2022-03-04: qty 30

## 2022-03-04 MED ORDER — PROPOFOL 10 MG/ML IV BOLUS
INTRAVENOUS | Status: DC | PRN
  Administered 2022-03-04: 20 mg via INTRAVENOUS
  Administered 2022-03-04: 10 mg via INTRAVENOUS

## 2022-03-04 MED ORDER — ONDANSETRON HCL 4 MG/2ML IJ SOLN
INTRAMUSCULAR | Status: DC | PRN
  Administered 2022-03-04: 4 mg via INTRAVENOUS

## 2022-03-04 MED ORDER — PROPOFOL 10 MG/ML IV BOLUS
INTRAVENOUS | Status: AC
  Filled 2022-03-04: qty 20

## 2022-03-04 MED ORDER — LACTATED RINGERS IV SOLN: INTRAVENOUS | Status: DC

## 2022-03-04 SURGICAL SUPPLY — 14 items
BAG COUNTER SPONGE SURGICOUNT (BAG) IMPLANT
BAG SPNG CNTER NS LX DISP (BAG)
CANISTER SUCT 1200ML W/VALVE (MISCELLANEOUS) ×2 IMPLANT
GLOVE BIO SURGEON STRL SZ7.5 (GLOVE) ×2 IMPLANT
KIT BASIN OR (CUSTOM PROCEDURE TRAY) ×2 IMPLANT
NDL PRECISIONGLIDE 27X1.5 (NEEDLE) IMPLANT
NEEDLE PRECISIONGLIDE 27X1.5 (NEEDLE) IMPLANT
PATTIES SURGICAL .5 X3 (DISPOSABLE) ×2 IMPLANT
SHEET MEDIUM DRAPE 40X70 STRL (DRAPES) IMPLANT
SOL ANTI FOG 6CC (MISCELLANEOUS) ×2 IMPLANT
SOLUTION ANTI FOG 6CC (MISCELLANEOUS) ×1
SYR CONTROL 10ML LL (SYRINGE) IMPLANT
TOWEL GREEN STERILE FF (TOWEL DISPOSABLE) ×2 IMPLANT
TUBE CONNECTING 20X1/4 (TUBING) ×2 IMPLANT

## 2022-03-04 NOTE — H&P (Signed)
Omar Ruiz is an 71 y.o. male.   Chief Complaint: Sleep apnea HPI: 71 year old male with obstructive sleep apnea who has not been able to tolerate CPAP.  Past Medical History:  Diagnosis Date   Arthritis    CAD (coronary artery disease)    a. s/p DES to LAD in 2011 with low-risk NST in 02/2017   Cancer [facility])    skin cancer on hand, cancer removed   Chest pain 07/2004   2D Echo EF>55%   Hyperlipemia    Hypertension 10/2004   stress test EF 57%   Obesity    Palpitation    Sleep apnea     Past Surgical History:  Procedure Laterality Date   COLONOSCOPY  10/2004   COLONOSCOPY N/A 03/29/2013   Procedure: COLONOSCOPY;  Surgeon: [name], MD;  [facility];  Service: Gastroenterology;  Laterality: N/A;   LEFT HEART CATH AND CORONARY ANGIOGRAPHY N/A 02/06/2022   Procedure: LEFT HEART CATH AND CORONARY ANGIOGRAPHY;  Surgeon: Belva Crome, MD;  Location: [place] CV LAB;  Service: Cardiovascular;  Laterality: N/A;   MOUTH SURGERY     NASAL SINUS SURGERY     SKIN CANCER EXCISION     right hand    STENTS  09/05/2009   3.0x32m promus drug eluting stent for a 90% mid LAD artery stenosis   TOTAL KNEE ARTHROPLASTY Right 08/27/2020   Procedure: TOTAL KNEE ARTHROPLASTY;  Surgeon: [name] MD;  Location: [facility];  Service: Orthopedics;  Laterality: Right;  561m   TOTAL SHOULDER ARTHROPLASTY Right 12/24/2017   Procedure: RIGHT TOTAL SHOULDER ARTHROPLASTY;  Surgeon: ChTania AdeMD;  [place] [facility] Service: [place]ics;  Laterality: Right;    Family History  Problem Relation Age of Onset   Diabetes Mother    Heart disease Father    Lung disease Other    Asthma Other    Diabetes Other    CAD Brother        3 brothers hx of cad   Social History:  reports that he quit smoking about 40 years ago. His smoking use included cigarettes. He has never used smokeless tobacco. He reports current alcohol use of about 12.0 standard drinks of alcohol per week. He  reports that he does not use drugs.  Allergies: No Known Allergies  Facility-Administered Medications Prior to Admission  Medication Dose Route Frequency Provider Last Rate Last Admin   sodium chloride flush (NS) 0.9 % injection 3 mL  3 mL Intravenous Q12H DiMarylu Lund NP       Medications Prior to Admission  Medication Sig Dispense Refill   acetaminophen (TYLENOL) 500 MG tablet Take 1,000 mg by mouth daily as needed for moderate pain or mild pain.     aspirin EC 81 MG tablet Take 81 mg by mouth daily. Swallow whole.     cholecalciferol (VITAMIN D3) 25 MCG (1000 UNIT) tablet Take 1,000 Units by mouth daily.     gabapentin (NEURONTIN) 100 MG capsule Take 100 mg by mouth daily.     metoprolol succinate (TOPROL XL) 25 MG 24 hr tablet Take 1.5 tablets (37.5 mg total) by mouth daily. 135 tablet 3   Misc Natural Products (YUMVS BEET ROOT-TART CHERRY PO) Take by mouth.     Multiple Vitamin (MULTIVITAMIN WITH MINERALS) TABS tablet Take 1 tablet by mouth daily.     naproxen (NAPROSYN) 500 MG tablet Take 500 mg by mouth 2 (two) times daily.  sertraline (ZOLOFT) 25 MG tablet Take 25 mg by mouth in the morning.     simvastatin (ZOCOR) 40 MG tablet Take 40 mg by mouth in the morning.     albuterol (VENTOLIN HFA) 108 (90 Base) MCG/ACT inhaler Inhale 2 puffs into the lungs every 6 (six) hours as needed for wheezing or shortness of breath.     ezetimibe (ZETIA) 10 MG tablet Take 1 tablet (10 mg total) by mouth daily. 90 tablet 3   nitroGLYCERIN (NITROSTAT) 0.4 MG SL tablet Place 1 tablet (0.4 mg total) under the tongue every 5 (five) minutes as needed for chest pain. 25 tablet 3    No results found for this or any previous visit (from the past 48 hour(s)). No results found.  Review of Systems  All other systems reviewed and are negative.   Blood pressure (!) 133/90, pulse (!) 59, temperature 97.7 F (36.5 C), temperature source Oral, resp. rate 16, height 6' (1.829 m), weight 116.7 kg,  SpO2 95 %. Physical Exam Constitutional:      Appearance: Normal appearance. He is normal weight.  HENT:     Head: Normocephalic and atraumatic.     Right Ear: External ear normal.     Left Ear: External ear normal.     Nose: Nose normal.     Mouth/Throat:     Mouth: Mucous membranes are moist.     Pharynx: Oropharynx is clear.  Eyes:     Extraocular Movements: Extraocular movements intact.     Conjunctiva/sclera: Conjunctivae normal.     Pupils: Pupils are equal, round, and reactive to light.  Cardiovascular:     Rate and Rhythm: Normal rate.  Pulmonary:     Effort: Pulmonary effort is normal.  Musculoskeletal:     Cervical back: Normal range of motion.  Skin:    General: Skin is warm and dry.  Neurological:     General: No focal deficit present.     Mental Status: He is alert and oriented to person, place, and time.  Psychiatric:        Mood and Affect: Mood normal.        Behavior: Behavior normal.        Thought Content: Thought content normal.        Judgment: Judgment normal.      Assessment/Plan Obstructive sleep apnea  To OR for sleep endoscopy.  Melida Quitter, MD 03/04/2022, 12:08 PM

## 2022-03-04 NOTE — Op Note (Signed)
Preop diagnosis: Obstructive sleep apnea Postop diagnosis: same Procedure: Drug-induced sleep endoscopy Surgeon: Cy Bresee Anesth: IV sedation Compl: None Findings: There is 100% anterior-posterior collapse at the velum making him a candidate for hypoglossal nerve stimulator placement.  There was also anterior-posterior collapse at the tongue base. Description:  After discussing risks, benefits, and alternatives, the patient was brought to the operative suite and placed on the operative table in the supine position.  Anesthesia was induced and the patient was given light sedation to simulate natural sleep. When the proper level was reached, an Afrin-soaked pledget was placed in the right nasal passage for a couple of minutes and then removed.  The fiberoptic laryngoscope was then passed to view the pharynx and larynx.  Findings are noted above and the exam was recorded.  After completion, the scope was removed and the patient was returned to anesthesia for wakeup and was moved to the recovery room in stable condition.  

## 2022-03-04 NOTE — Discharge Instructions (Signed)

## 2022-03-04 NOTE — Anesthesia Postprocedure Evaluation (Signed)
Anesthesia Post Note  Patient: Omar Ruiz  Procedure(s) Performed: DRUG INDUCED ENDOSCOPY (Bilateral: Nose)     Patient location during evaluation: PACU Anesthesia Type: General Level of consciousness: sedated and patient cooperative Pain management: pain level controlled Vital Signs Assessment: post-procedure vital signs reviewed and stable Respiratory status: spontaneous breathing Cardiovascular status: stable Anesthetic complications: no   No notable events documented.  Last Vitals:  Vitals:   03/04/22 1303 03/04/22 1315  BP: 122/74 125/79  Pulse: (!) 56 60  Resp: 11 13  Temp: (!) 36.4 C   SpO2: 99% 96%    Last Pain:  Vitals:   03/04/22 1332  TempSrc:   PainSc: 0-No pain                 Nolon Nations

## 2022-03-04 NOTE — Transfer of Care (Signed)
Immediate Anesthesia Transfer of Care Note  Patient: Omar Ruiz  Procedure(s) Performed: DRUG INDUCED ENDOSCOPY (Bilateral: Nose)  Patient Location: PACU  Anesthesia Type:MAC  Level of Consciousness: drowsy  Airway & Oxygen Therapy: Patient Spontanous Breathing and Patient connected to face mask oxygen  Post-op Assessment: Report given to RN and Post -op Vital signs reviewed and stable  Post vital signs: Reviewed and stable  Last Vitals:  Vitals Value Taken Time  BP 122/74 03/04/22 1302  Temp    Pulse 56 03/04/22 1303  Resp 11 03/04/22 1303  SpO2 99 % 03/04/22 1303  Vitals shown include unvalidated device data.  Last Pain:  Vitals:   03/04/22 1010  TempSrc: Oral  PainSc: 0-No pain         Complications: No notable events documented.

## 2022-03-04 NOTE — Anesthesia Preprocedure Evaluation (Addendum)
Anesthesia Evaluation  Patient identified by MRN, date of birth, ID band Patient awake    Reviewed: Allergy & Precautions, NPO status , Patient's Chart, lab work & pertinent test results, reviewed documented beta blocker date and time   History of Anesthesia Complications Negative for: history of anesthetic complications  Airway Mallampati: II  TM Distance: >3 FB Neck ROM: Full    Dental  (+) Partial Upper, Dental Advisory Given   Pulmonary sleep apnea and Continuous Positive Airway Pressure Ventilation , COPD, former smoker,  08/23/2020 SARS coronavirus NEG   Pulmonary exam normal breath sounds clear to auscultation       Cardiovascular hypertension, Pt. on medications and Pt. on home beta blockers (-) angina+ CAD and + Cardiac Stents  Normal cardiovascular exam Rhythm:Regular Rate:Normal  LHC 01/2022 CONCLUSIONS: ? Left main was normal ? Mid LAD contains de novo segmental 30 to 40% narrowing.  LAD stent contains distal 30% in-stent restenosis.  LAD does not contain any obstructive disease. ? Circumflex contains 40% ostial to proximal narrowing and 30% distal narrowing.  No obstructive disease is noted. ? RCA is dominant and widely patent. ? LV function is normal.  EF is 55%.  EDP is normal.  RECOMMENDATIONS: ? Continue preventive therapy and risk factor modification.   '18 ECHO: EF 60-65%, Grade 1 DD, no significant valvular abnormalities '21 exercise stress test negative for ischemia   Neuro/Psych PSYCHIATRIC DISORDERS Depression C-Spine MRI 02/2022 1. No acute fracture.  2. Multilevel degenerative disc disease and facet arthrosisas described above. This is most notable for severe bilateral neuroforaminal stenosis and mild spinal canal narrowing at C6-C7; severe LEFT and moderate RIGHT neuroforaminal stenosis at C4-C5 and C5-C6; severe bilateral neuroforaminal stenosis (more pronounced on the LEFT) at C3-C4; moderate  LEFT neuroforaminal stenosis at C2-C3.      GI/Hepatic negative GI ROS, Neg liver ROS,   Endo/Other  negative endocrine ROS  Renal/GU negative Renal ROS     Musculoskeletal  (+) Arthritis , Osteoarthritis,    Abdominal   Peds  Hematology negative hematology ROS (+)   Anesthesia Other Findings   Reproductive/Obstetrics                            Anesthesia Physical  Anesthesia Plan  ASA: 3  Anesthesia Plan: General   Post-op Pain Management:  Regional for Post-op pain and Minimal or no pain anticipated   Induction: Intravenous  PONV Risk Score and Plan: 1 and Ondansetron, Propofol infusion, TIVA and Treatment may vary due to age or medical condition  Airway Management Planned: Simple Face Mask and Natural Airway  Additional Equipment: None  Intra-op Plan:   Post-operative Plan:   Informed Consent: I have reviewed the patients History and Physical, chart, labs and discussed the procedure including the risks, benefits and alternatives for the proposed anesthesia with the patient or authorized representative who has indicated his/her understanding and acceptance.     Dental advisory given  Plan Discussed with: CRNA  Anesthesia Plan Comments:       Anesthesia Quick Evaluation

## 2022-03-04 NOTE — Brief Op Note (Signed)
03/04/2022  1:04 PM  PATIENT:  [name]  71 y.o. male  PRE-OPERATIVE DIAGNOSIS:  Obstructive Sleep Apnea BMI 34.0-34.9,adult  POST-OPERATIVE DIAGNOSIS:  Obstructive Sleep Apnea  PROCEDURE:  Procedure(s): DRUG INDUCED ENDOSCOPY (Bilateral)  SURGEON:  Surgeon(s) and Role:    Melida Quitter, MD - Primary  PHYSICIAN ASSISTANT:   ASSISTANTS: none   ANESTHESIA:   IV sedation  EBL:  None   BLOOD ADMINISTERED:none  DRAINS: none   LOCAL MEDICATIONS USED:  NONE  SPECIMEN:  No Specimen  DISPOSITION OF SPECIMEN:  N/A  COUNTS:  YES  TOURNIQUET:  * No tourniquets in log *  DICTATION: .Note written in EPIC  PLAN OF CARE: Discharge to home after PACU  PATIENT DISPOSITION:  PACU - hemodynamically stable.   Delay start of Pharmacological VTE agent (>24hrs) due to surgical blood loss or risk of bleeding: no

## 2022-03-05 ENCOUNTER — Encounter (HOSPITAL_BASED_OUTPATIENT_CLINIC_OR_DEPARTMENT_OTHER): Payer: Self-pay | Admitting: Otolaryngology

## 2022-03-05 NOTE — Progress Notes (Signed)
Left message stating courtesy call and if any questions or concerns please call the doctors office.  

## 2022-03-17 ENCOUNTER — Other Ambulatory Visit: Payer: Self-pay | Admitting: Otolaryngology

## 2022-04-08 NOTE — Progress Notes (Signed)
Surgical Instructions    Your procedure is scheduled on Wednesday December 6th.  Report to [facility] Main Entrance "A" at 8 A.M., then check in with the Admitting office.  Call this number if you have problems the morning of surgery:  [phone]   If you have any questions prior to your surgery date call [phone]: Open Monday-Friday 8am-4pm If you experience any cold or flu symptoms such as cough, fever, chills, shortness of breath, etc. between now and your scheduled surgery, please notify us at the above number     Remember:  Do not eat after midnight the night before your surgery  You may drink clear liquids until 7am the morning of your surgery.   Clear liquids allowed are: Water, Non-Citrus Juices (without pulp), Carbonated Beverages, Clear Tea, Black Coffee ONLY (NO MILK, CREAM OR POWDERED CREAMER of any kind), and Gatorade    Take these medicines the morning of surgery with A SIP OF WATER: ezetimibe (ZETIA) 10 MG tablet  metoprolol succinate (TOPROL XL) 25 MG 24 hr tablet  sertraline (ZOLOFT) 25 MG tablet  simvastatin (ZOCOR) 40 MG tablet   IF NEEDED  acetaminophen (TYLENOL) 500 MG tablet  albuterol (VENTOLIN HFA) 108 (90 Base) MCG/ACT inhaler  - please bring with you to the hospital nitroGLYCERIN (NITROSTAT) 0.4 MG SL tablet   Follow your surgeon's instructions on when to stop Aspirin.  If no instructions were given by your surgeon then you will need to call the office to get those instructions.    As of today, STOP taking any Aspirin (unless otherwise instructed by your surgeon) Aleve, Naproxen, Ibuprofen, Motrin, Advil, Goody's, BC's, all herbal medications, fish oil, and all vitamins.           Do not wear jewelry. Do not wear lotions, powders, cologne or deodorant. Do not shave 48 hours prior to surgery.  Men may shave face and neck. Do not bring valuables to the hospital. Do not wear nail polish  [place] is not responsible for any belongings or  valuables.    Do NOT Smoke (Tobacco/Vaping)  24 hours prior to your procedure  If you use a CPAP at night, you may bring your mask for your overnight stay.   Contacts, glasses, hearing aids, dentures or partials may not be worn into surgery, please bring cases for these belongings   For patients admitted to the hospital, discharge time will be determined by your treatment team.   Patients discharged the day of surgery will not be allowed to drive home, and someone needs to stay with them for 24 hours.   SURGICAL WAITING ROOM VISITATION Patients having surgery or a procedure may have no more than 2 support people in the waiting area - these visitors may rotate.   Children under the age of 73 must have an adult with them who is not the patient. If the patient needs to stay at the hospital during part of their recovery, the visitor guidelines for inpatient rooms apply. Pre-op nurse will coordinate an appropriate time for 1 support person to accompany patient in pre-op.  This support person may not rotate.   Please refer to [email or web address] for the visitor guidelines for Inpatients (after your surgery is over and you are in a regular room).    Special instructions:    Oral Hygiene is also important to reduce your risk of infection.  Remember - BRUSH YOUR TEETH THE MORNING OF SURGERY WITH YOUR REGULAR TOOTHPASTE   [place]- Preparing For Surgery  Before surgery, you can play an important role. Because skin is not sterile, your skin needs to be as free of germs as possible. You can reduce the number of germs on your skin by washing with CHG (chlorahexidine gluconate) Soap before surgery.  CHG is an antiseptic cleaner which kills germs and bonds with the skin to continue killing germs even after washing.     Please do not use if you have an allergy to CHG or antibacterial soaps. If your skin becomes reddened/irritated stop using the  CHG.  Do not shave (including legs and underarms) for at least 48 hours prior to first CHG shower. It is OK to shave your face.  Please follow these instructions carefully.     Shower the NIGHT BEFORE SURGERY and the MORNING OF SURGERY with CHG Soap.   If you chose to wash your hair, wash your hair first as usual with your normal shampoo. After you shampoo, rinse your hair and body thoroughly to remove the shampoo.  Then [employer] and genitals (private parts) with your normal soap and rinse thoroughly to remove soap.  After that Use CHG Soap as you would any other liquid soap. You can apply CHG directly to the skin and wash gently with a scrungie or a clean washcloth.   Apply the CHG Soap to your body ONLY FROM THE NECK DOWN.  Do not use on open wounds or open sores. Avoid contact with your eyes, ears, mouth and genitals (private parts). Wash Face and genitals (private parts)  with your normal soap.   Wash thoroughly, paying special attention to the area where your surgery will be performed.  Thoroughly rinse your body with warm water from the neck down.  DO NOT shower/wash with your normal soap after using and rinsing off the CHG Soap.  Pat yourself dry with a CLEAN TOWEL.  Wear CLEAN PAJAMAS to bed the night before surgery  Place CLEAN SHEETS on your bed the night before your surgery  DO NOT SLEEP WITH PETS.   Day of Surgery:  Take a shower with CHG soap. Wear Clean/Comfortable clothing the morning of surgery Do not apply any deodorants/lotions.   Remember to brush your teeth WITH YOUR REGULAR TOOTHPASTE.    If you received a COVID test during your pre-op visit, it is requested that you wear a mask when out in public, stay away from anyone that may not be feeling well, and notify your surgeon if you develop symptoms. If you have been in contact with anyone that has tested positive in the last 10 days, please notify your surgeon.    Please read over the following fact sheets  that you were given.

## 2022-04-09 ENCOUNTER — Encounter (HOSPITAL_COMMUNITY): Payer: Self-pay

## 2022-04-09 ENCOUNTER — Other Ambulatory Visit: Payer: Self-pay

## 2022-04-09 ENCOUNTER — Encounter (HOSPITAL_COMMUNITY)
Admission: RE | Admit: 2022-04-09 | Discharge: 2022-04-09 | Disposition: A | Discharge status: Home / Self Care | Payer: Medicare PPO | Source: Ambulatory Visit | Attending: Otolaryngology | Admitting: Otolaryngology

## 2022-04-09 VITALS — BP 158/92 | HR 66 | Temp 97.4°F | Resp 18 | Ht 73.0 in | Wt 258.0 lb

## 2022-04-09 DIAGNOSIS — Z01812 Encounter for preprocedural laboratory examination: Secondary | ICD-10-CM | POA: Diagnosis present

## 2022-04-09 DIAGNOSIS — Z79899 Other long term (current) drug therapy: Secondary | ICD-10-CM | POA: Diagnosis not present

## 2022-04-09 DIAGNOSIS — Z01818 Encounter for other preprocedural examination: Secondary | ICD-10-CM

## 2022-04-09 DIAGNOSIS — I700 Atherosclerosis of aorta: Secondary | ICD-10-CM | POA: Insufficient documentation

## 2022-04-09 LAB — CBC
HCT: 43 % (ref 39.0–52.0)
Hemoglobin: 14 g/dL (ref 13.0–17.0)
MCH: 30.4 pg (ref 26.0–34.0)
MCHC: 32.6 g/dL (ref 30.0–36.0)
MCV: 93.3 fL (ref 80.0–100.0)
Platelets: 215 10*3/uL (ref 150–400)
RBC: 4.61 MIL/uL (ref 4.22–5.81)
RDW: 13.6 % (ref 11.5–15.5)
WBC: 6 10*3/uL (ref 4.0–10.5)
nRBC: 0 % (ref 0.0–0.2)

## 2022-04-09 LAB — BASIC METABOLIC PANEL
Anion gap: 9 (ref 5–15)
BUN: 13 mg/dL (ref 8–23)
CO2: 28 mmol/L (ref 22–32)
Calcium: 9.2 mg/dL (ref 8.9–10.3)
Chloride: 104 mmol/L (ref 98–111)
Creatinine, Ser: 0.92 mg/dL (ref 0.61–1.24)
GFR, Estimated: >60 mL/min (ref >60)
Glucose, Bld: 93 mg/dL (ref 70–99)
Potassium: 4.2 mmol/L (ref 3.5–5.1)
Sodium: 141 mmol/L (ref 135–145)

## 2022-04-09 LAB — SURGICAL PCR SCREEN
MRSA, PCR: NEGATIVE
Staphylococcus aureus: NEGATIVE

## 2022-04-09 NOTE — Progress Notes (Signed)
PCP - [name], NP Cardiologist - Dr. Rudean Haskell  PPM/ICD - denies   Chest x-ray - 01/04/22 EKG - 02/06/22 Stress Test - 12/22/19 ECHO - 09/13/21 Cardiac Cath - 02/06/22  Sleep Study - 01/14/22, OSA+ CPAP - denies (cannot tolerate)  DM- denies   Blood Thinner Instructions: n/a Aspirin Instructions: f/u with surgeon  ERAS Protcol - yes, no drink   COVID TEST- n/a   Anesthesia review: yes, cardiac hx  Patient denies shortness of breath, fever, cough and chest pain at PAT appointment   All instructions explained to the patient, with a verbal understanding of the material. Patient agrees to go over the instructions while at home for a better understanding. The opportunity to ask questions was provided.

## 2022-04-11 NOTE — Progress Notes (Signed)
Anesthesia Chart Review:  Follows with cardiology for history of CAD s/p DES to LAD in 2011, CVA, HTN, HLD, OSA intolerant to CPAP.  Patient recently had heart cath 02/06/2021 for evaluation of chest pain showed no worsening of known CAD, no obstructive disease.  Last seen by Dr. Gasper Sells 02/28/2022.  Patient reported he still had rare chest pain and rare palpitations.  Reports he is only used his nitro once in 10+ years.  His metoprolol was increased.  No other changes to management.  Recommended follow-up in 4 months.  Preop labs reviewed, WNL.  EKG 02/06/2022: NSR.  Rate 61.  Cath 02/06/2022: CONCLUSIONS:  Left main was normal  Mid LAD contains de novo segmental 30 to 40% narrowing.  LAD stent contains distal 30% in-stent restenosis.  LAD does not contain any obstructive disease.  Circumflex contains 40% ostial to proximal narrowing and 30% distal narrowing.  No obstructive disease is noted.  RCA is dominant and widely patent.  LV function is normal.  EF is 55%.  EDP is normal.   RECOMMENDATIONS:  Continue preventive therapy and risk factor modification.  Cardiac event monitor 02/05/2022:   Patient had a minimum heart rate of 53 bpm, maximum heart rate of 197 bpm, and average heart rate of 73 bpm.   Predominant underlying rhythm was sinus rhythm.   Paroxysmal SVT lasting 17 seconds at longest.   Isolated PACs were rare (<1.0%).   Isolated PVCs were rare (<1.0%).   Triggered and diary events associated with sinus rhythm and PVCs.  TTE 09/13/2021:  1. Left ventricular ejection fraction, by estimation, is 65 to 70%. The  left ventricle has normal function. The left ventricle has no regional  wall motion abnormalities. The left ventricular internal cavity size was  mildly dilated. There is mild left  ventricular hypertrophy of the basal-septal segment. Left ventricular  diastolic parameters are indeterminate.   2. Right ventricular systolic function is normal. The right  ventricular  size is severely enlarged. A Prominent moderator band is visualized.   3. Right atrial size was severely dilated.   4. The mitral valve is normal in structure. No evidence of mitral valve  regurgitation. No evidence of mitral stenosis.   5. The aortic valve is normal in structure. Aortic valve regurgitation is  not visualized. No aortic stenosis is present.   6. Aortic dilatation noted. There is borderline dilatation of the aortic  root, measuring 37 mm. There is borderline dilatation of the ascending  aorta, measuring 37 mm.   7. The inferior vena cava is normal in size with greater than 50%  respiratory variability, suggesting right atrial pressure of 3 mmHg.   8. Agitated saline contrast bubble study was negative, with no evidence  of any interatrial shunt.   Conclusion(s)/Recommendation(s): No intracardiac source of embolism  detected on this transthoracic study. Consider a transesophageal  echocardiogram to exclude cardiac source of embolism if clinically  indicated.     Omar Ruiz [facility] Short Stay Center/Anesthesiology Phone [phone] 04/11/2022 3:19 PM

## 2022-04-11 NOTE — Anesthesia Preprocedure Evaluation (Signed)
Anesthesia Evaluation  Patient identified by MRN, date of birth, ID band Patient awake    Reviewed: Allergy & Precautions, NPO status , Patient's Chart, lab work & pertinent test results, reviewed documented beta blocker date and time   History of Anesthesia Complications Negative for: history of anesthetic complications  Airway Mallampati: II  TM Distance: >3 FB Neck ROM: Full    Dental  (+) Missing,    Pulmonary sleep apnea , former smoker   Pulmonary exam normal        Cardiovascular hypertension, Pt. on medications and Pt. on home beta blockers + Cardiac Stents (2011)  Normal cardiovascular exam     Neuro/Psych    Depression       GI/Hepatic   Endo/Other    Renal/GU      Musculoskeletal  (+) Arthritis ,    Abdominal   Peds  Hematology   Anesthesia Other Findings   Reproductive/Obstetrics                             Anesthesia Physical Anesthesia Plan  ASA: 3  Anesthesia Plan: General   Post-op Pain Management: Ofirmev IV (intra-op)*   Induction: Intravenous  PONV Risk Score and Plan: 3 and Treatment may vary due to age or medical condition, Midazolam, Dexamethasone and Ondansetron  Airway Management Planned: Oral ETT  Additional Equipment: None  Intra-op Plan:   Post-operative Plan: Extubation in OR  Informed Consent: I have reviewed the patients History and Physical, chart, labs and discussed the procedure including the risks, benefits and alternatives for the proposed anesthesia with the patient or authorized representative who has indicated his/her understanding and acceptance.     Dental advisory given  Plan Discussed with: CRNA  Anesthesia Plan Comments: (PAT note by Karoline Caldwell, PA-C: Follows with cardiology for history of CAD s/p DES to LAD in 2011, CVA, HTN, HLD, OSA intolerant to CPAP.  Patient recently had heart cath 02/06/2021 for evaluation of chest pain  showed no worsening of known CAD, no obstructive disease.  Last seen by Dr. Gasper Sells 02/28/2022.  Patient reported he still had rare chest pain and rare palpitations.  Reports he is only used his nitro once in 10+ years.  His metoprolol was increased.  No other changes to management.  Recommended follow-up in 4 months.  Preop labs reviewed, WNL.  EKG 02/06/2022: NSR.  Rate 61.  Cath 02/06/2022: CONCLUSIONS:  Left main was normal  Mid LAD contains de novo segmental 30 to 40% narrowing.  LAD stent contains distal 30% in-stent restenosis.  LAD does not contain any obstructive disease.  Circumflex contains 40% ostial to proximal narrowing and 30% distal narrowing.  No obstructive disease is noted.  RCA is dominant and widely patent.  LV function is normal.  EF is 55%.  EDP is normal.   RECOMMENDATIONS:  Continue preventive therapy and risk factor modification.  Cardiac event monitor 02/05/2022:  Patient had a minimum heart rate of 53 bpm, maximum heart rate of 197 bpm, and average heart rate of 73 bpm.  Predominant underlying rhythm was sinus rhythm.  Paroxysmal SVT lasting 17 seconds at longest.  Isolated PACs were rare (<1.0%).  Isolated PVCs were rare (<1.0%).  Triggered and diary events associated with sinus rhythm and PVCs.  TTE 09/13/2021: 1. Left ventricular ejection fraction, by estimation, is 65 to 70%. The  left ventricle has normal function. The left ventricle has no regional  wall motion abnormalities. The left  ventricular internal cavity size was  mildly dilated. There is mild left  ventricular hypertrophy of the basal-septal segment. Left ventricular  diastolic parameters are indeterminate.  2. Right ventricular systolic function is normal. The right ventricular  size is severely enlarged. A Prominent moderator band is visualized.  3. Right atrial size was severely dilated.  4. The mitral valve is normal in structure. No evidence of mitral valve   regurgitation. No evidence of mitral stenosis.  5. The aortic valve is normal in structure. Aortic valve regurgitation is  not visualized. No aortic stenosis is present.  6. Aortic dilatation noted. There is borderline dilatation of the aortic  root, measuring 37 mm. There is borderline dilatation of the ascending  aorta, measuring 37 mm.  7. The inferior vena cava is normal in size with greater than 50%  respiratory variability, suggesting right atrial pressure of 3 mmHg.  8. Agitated saline contrast bubble study was negative, with no evidence  of any interatrial shunt.   Conclusion(s)/Recommendation(s): No intracardiac source of embolism  detected on this transthoracic study. Consider a transesophageal  echocardiogram to exclude cardiac source of embolism if clinically  indicated.     )        Anesthesia Quick Evaluation

## 2022-04-16 ENCOUNTER — Ambulatory Visit (HOSPITAL_COMMUNITY): Payer: Medicare PPO | Admitting: Physician Assistant

## 2022-04-16 ENCOUNTER — Encounter (HOSPITAL_COMMUNITY)
Admission: RE | Disposition: A | Discharge status: Home / Self Care | Payer: Self-pay | Source: Home / Self Care | Attending: Otolaryngology

## 2022-04-16 ENCOUNTER — Ambulatory Visit (HOSPITAL_BASED_OUTPATIENT_CLINIC_OR_DEPARTMENT_OTHER): Payer: Medicare PPO | Admitting: Physician Assistant

## 2022-04-16 ENCOUNTER — Ambulatory Visit (HOSPITAL_COMMUNITY)
Admission: RE | Admit: 2022-04-16 | Discharge: 2022-04-16 | Disposition: A | Discharge status: Home / Self Care | Payer: Medicare PPO | Attending: Otolaryngology | Admitting: Otolaryngology

## 2022-04-16 ENCOUNTER — Ambulatory Visit (HOSPITAL_COMMUNITY): Payer: Medicare PPO

## 2022-04-16 ENCOUNTER — Other Ambulatory Visit: Payer: Self-pay

## 2022-04-16 DIAGNOSIS — M1990 Unspecified osteoarthritis, unspecified site: Secondary | ICD-10-CM | POA: Insufficient documentation

## 2022-04-16 DIAGNOSIS — Z955 Presence of coronary angioplasty implant and graft: Secondary | ICD-10-CM | POA: Insufficient documentation

## 2022-04-16 DIAGNOSIS — Z87891 Personal history of nicotine dependence: Secondary | ICD-10-CM | POA: Diagnosis not present

## 2022-04-16 DIAGNOSIS — Z789 Other specified health status: Secondary | ICD-10-CM | POA: Diagnosis not present

## 2022-04-16 DIAGNOSIS — Z6833 Body mass index (BMI) 33.0-33.9, adult: Secondary | ICD-10-CM | POA: Insufficient documentation

## 2022-04-16 DIAGNOSIS — F32A Depression, unspecified: Secondary | ICD-10-CM | POA: Insufficient documentation

## 2022-04-16 DIAGNOSIS — I10 Essential (primary) hypertension: Secondary | ICD-10-CM | POA: Diagnosis not present

## 2022-04-16 DIAGNOSIS — G4733 Obstructive sleep apnea (adult) (pediatric): Secondary | ICD-10-CM | POA: Insufficient documentation

## 2022-04-16 DIAGNOSIS — I2510 Atherosclerotic heart disease of native coronary artery without angina pectoris: Secondary | ICD-10-CM | POA: Diagnosis not present

## 2022-04-16 DIAGNOSIS — E669 Obesity, unspecified: Secondary | ICD-10-CM | POA: Insufficient documentation

## 2022-04-16 DIAGNOSIS — Z791 Long term (current) use of non-steroidal anti-inflammatories (NSAID): Secondary | ICD-10-CM | POA: Insufficient documentation

## 2022-04-16 HISTORY — PX: IMPLANTATION OF HYPOGLOSSAL NERVE STIMULATOR: SHX6827

## 2022-04-16 SURGERY — INSERTION, HYPOGLOSSAL NERVE STIMULATOR
Anesthesia: General | Site: Neck | Laterality: Right

## 2022-04-16 MED ORDER — OXYCODONE HCL 5 MG/5ML PO SOLN: 5.0000 mg | Freq: Once | ORAL | Status: DC | PRN

## 2022-04-16 MED ORDER — PROPOFOL 10 MG/ML IV BOLUS
INTRAVENOUS | Status: AC
  Filled 2022-04-16: qty 20

## 2022-04-16 MED ORDER — MIDAZOLAM HCL 2 MG/2ML IJ SOLN
INTRAMUSCULAR | Status: AC
  Filled 2022-04-16: qty 2

## 2022-04-16 MED ORDER — LIDOCAINE 2% (20 MG/ML) 5 ML SYRINGE
INTRAMUSCULAR | Status: DC | PRN
  Administered 2022-04-16: 100 mg via INTRAVENOUS

## 2022-04-16 MED ORDER — FENTANYL CITRATE (PF) 250 MCG/5ML IJ SOLN
INTRAMUSCULAR | Status: AC
  Filled 2022-04-16: qty 5

## 2022-04-16 MED ORDER — MIDAZOLAM HCL 2 MG/2ML IJ SOLN
INTRAMUSCULAR | Status: DC | PRN
  Administered 2022-04-16: 2 mg via INTRAVENOUS

## 2022-04-16 MED ORDER — DEXAMETHASONE SODIUM PHOSPHATE 10 MG/ML IJ SOLN
INTRAMUSCULAR | Status: AC
  Filled 2022-04-16: qty 1

## 2022-04-16 MED ORDER — ORAL CARE MOUTH RINSE: 15.0000 mL | Freq: Once | OROMUCOSAL | Status: AC

## 2022-04-16 MED ORDER — HYDROCODONE-ACETAMINOPHEN 5-325 MG PO TABS: 1.0000 | ORAL_TABLET | Freq: Four times a day (QID) | ORAL | 0 refills | Status: DC | PRN

## 2022-04-16 MED ORDER — PHENYLEPHRINE HCL-NACL 20-0.9 MG/250ML-% IV SOLN
INTRAVENOUS | Status: AC
  Filled 2022-04-16: qty 250

## 2022-04-16 MED ORDER — CHLORHEXIDINE GLUCONATE 0.12 % MT SOLN
15.0000 mL | Freq: Once | OROMUCOSAL | Status: AC
  Administered 2022-04-16: 15 mL via OROMUCOSAL
  Filled 2022-04-16: qty 15

## 2022-04-16 MED ORDER — ONDANSETRON HCL 4 MG/2ML IJ SOLN
INTRAMUSCULAR | Status: DC | PRN
  Administered 2022-04-16: 4 mg via INTRAVENOUS

## 2022-04-16 MED ORDER — CEFAZOLIN SODIUM-DEXTROSE 2-4 GM/100ML-% IV SOLN
2.0000 g | INTRAVENOUS | Status: AC
  Administered 2022-04-16: 2 g via INTRAVENOUS
  Filled 2022-04-16: qty 100

## 2022-04-16 MED ORDER — 0.9 % SODIUM CHLORIDE (POUR BTL) OPTIME
TOPICAL | Status: DC | PRN
  Administered 2022-04-16: 1000 mL

## 2022-04-16 MED ORDER — PHENYLEPHRINE 80 MCG/ML (10ML) SYRINGE FOR IV PUSH (FOR BLOOD PRESSURE SUPPORT)
PREFILLED_SYRINGE | INTRAVENOUS | Status: DC | PRN
  Administered 2022-04-16: 160 ug via INTRAVENOUS

## 2022-04-16 MED ORDER — DEXAMETHASONE SODIUM PHOSPHATE 10 MG/ML IJ SOLN
INTRAMUSCULAR | Status: DC | PRN
  Administered 2022-04-16: 10 mg via INTRAVENOUS

## 2022-04-16 MED ORDER — FENTANYL CITRATE (PF) 100 MCG/2ML IJ SOLN: 25.0000 ug | INTRAMUSCULAR | Status: DC | PRN

## 2022-04-16 MED ORDER — LIDOCAINE-EPINEPHRINE 1 %-1:100000 IJ SOLN
INTRAMUSCULAR | Status: DC | PRN
  Administered 2022-04-16: 5.5 mL

## 2022-04-16 MED ORDER — SUCCINYLCHOLINE CHLORIDE 200 MG/10ML IV SOSY
PREFILLED_SYRINGE | INTRAVENOUS | Status: DC | PRN
  Administered 2022-04-16: 160 mg via INTRAVENOUS

## 2022-04-16 MED ORDER — PROPOFOL 1000 MG/100ML IV EMUL
INTRAVENOUS | Status: AC
  Filled 2022-04-16: qty 200

## 2022-04-16 MED ORDER — EPHEDRINE SULFATE-NACL 50-0.9 MG/10ML-% IV SOSY
PREFILLED_SYRINGE | INTRAVENOUS | Status: DC | PRN
  Administered 2022-04-16 (×2): 10 mg via INTRAVENOUS

## 2022-04-16 MED ORDER — LACTATED RINGERS IV SOLN: INTRAVENOUS | Status: DC | PRN

## 2022-04-16 MED ORDER — PROPOFOL 10 MG/ML IV BOLUS
INTRAVENOUS | Status: DC | PRN
  Administered 2022-04-16: 170 mg via INTRAVENOUS
  Administered 2022-04-16: 30 mg via INTRAVENOUS

## 2022-04-16 MED ORDER — FENTANYL CITRATE (PF) 250 MCG/5ML IJ SOLN
INTRAMUSCULAR | Status: DC | PRN
  Administered 2022-04-16 (×2): 100 ug via INTRAVENOUS

## 2022-04-16 MED ORDER — PHENYLEPHRINE HCL-NACL 20-0.9 MG/250ML-% IV SOLN
INTRAVENOUS | Status: DC | PRN
  Administered 2022-04-16: 50 ug/min via INTRAVENOUS

## 2022-04-16 MED ORDER — ACETAMINOPHEN 10 MG/ML IV SOLN: 1000.0000 mg | Freq: Once | INTRAVENOUS | Status: DC | PRN

## 2022-04-16 MED ORDER — LIDOCAINE-EPINEPHRINE 1 %-1:100000 IJ SOLN
INTRAMUSCULAR | Status: AC
  Filled 2022-04-16: qty 1

## 2022-04-16 MED ORDER — ONDANSETRON HCL 4 MG/2ML IJ SOLN
INTRAMUSCULAR | Status: AC
  Filled 2022-04-16: qty 2

## 2022-04-16 MED ORDER — OXYCODONE HCL 5 MG PO TABS: 5.0000 mg | ORAL_TABLET | Freq: Once | ORAL | Status: DC | PRN

## 2022-04-16 MED ORDER — LACTATED RINGERS IV SOLN: INTRAVENOUS | Status: DC

## 2022-04-16 SURGICAL SUPPLY — 70 items
ACC NRSTM 4 TRQ WRNCH STRL (MISCELLANEOUS)
ADH SKN CLS APL DERMABOND .7 (GAUZE/BANDAGES/DRESSINGS) ×2
BAG COUNTER SPONGE SURGICOUNT (BAG) ×2 IMPLANT
BAG SPNG CNTER NS LX DISP (BAG) ×1
BLADE SURG 15 STRL LF DISP TIS (BLADE) ×6 IMPLANT
BLADE SURG 15 STRL SS (BLADE) ×1
CANISTER SUCT 3000ML PPV (MISCELLANEOUS) ×2 IMPLANT
CORD BIPOLAR FORCEPS 12FT (ELECTRODE) ×2 IMPLANT
COVER PROBE W GEL 5X96 (DRAPES) ×2 IMPLANT
COVER SURGICAL LIGHT HANDLE (MISCELLANEOUS) ×2 IMPLANT
DERMABOND ADVANCED .7 DNX12 (GAUZE/BANDAGES/DRESSINGS) ×4 IMPLANT
DRAPE HEAD BAR (DRAPES) ×2 IMPLANT
DRAPE INCISE IOBAN 66X45 STRL (DRAPES) ×2 IMPLANT
DRAPE MICROSCOPE LEICA 54X105 (DRAPES) ×2 IMPLANT
DRAPE UTILITY XL STRL (DRAPES) ×2 IMPLANT
DRSG TEGADERM 4X10 (GAUZE/BANDAGES/DRESSINGS) IMPLANT
DRSG TEGADERM 4X4.75 (GAUZE/BANDAGES/DRESSINGS) ×6 IMPLANT
ELECT COATED BLADE 2.86 ST (ELECTRODE) ×2 IMPLANT
ELECT EMG 18 NIMS (NEUROSURGERY SUPPLIES) ×1
ELECT REM PT RETURN 9FT ADLT (ELECTROSURGICAL) ×1
ELECTRODE EMG 18 NIMS (NEUROSURGERY SUPPLIES) ×2 IMPLANT
ELECTRODE REM PT RTRN 9FT ADLT (ELECTROSURGICAL) ×2 IMPLANT
FORCEPS BIPOLAR SPETZLER 8 1.0 (NEUROSURGERY SUPPLIES) ×2 IMPLANT
GAUZE 4X4 16PLY ~~LOC~~+RFID DBL (SPONGE) ×2 IMPLANT
GAUZE SPONGE 4X4 12PLY STRL (GAUZE/BANDAGES/DRESSINGS) ×2 IMPLANT
GAUZE SPONGE 4X4 12PLY STRL LF (GAUZE/BANDAGES/DRESSINGS) IMPLANT
GENERATOR PULSE INSPIRE (Generator) ×1 IMPLANT
GENERATOR PULSE INSPIRE IV (Generator) ×2 IMPLANT
GLOVE BIO SURGEON STRL SZ7.5 (GLOVE) ×2 IMPLANT
GOWN STRL REUS W/ TWL LRG LVL3 (GOWN DISPOSABLE) ×6 IMPLANT
GOWN STRL REUS W/TWL LRG LVL3 (GOWN DISPOSABLE) ×3
IV CATH AUTO 14GX1.75 SAFE ORG (IV SOLUTION) IMPLANT
KIT BASIN OR (CUSTOM PROCEDURE TRAY) ×2 IMPLANT
KIT NEURO ACCESSORY W/WRENCH (MISCELLANEOUS) IMPLANT
KIT TURNOVER KIT B (KITS) ×2 IMPLANT
LEAD SENSING RESP INSPIRE (Lead) ×1 IMPLANT
LEAD SENSING RESP INSPIRE IV (Lead) ×2 IMPLANT
LEAD SLEEP STIM INSPIRE IV/V (Lead) ×2 IMPLANT
LEAD SLEEP STIMULATION INSPIRE (Lead) ×1 IMPLANT
LOOP VESSEL MAXI BLUE (MISCELLANEOUS) ×2 IMPLANT
LOOP VESSEL MINI RED (MISCELLANEOUS) ×2 IMPLANT
MARKER SKIN DUAL TIP RULER LAB (MISCELLANEOUS) ×4 IMPLANT
NDL BEVEL SYR 25X1 (NEEDLE) IMPLANT
NDL HYPO 25GX1X1/2 BEV (NEEDLE) ×2 IMPLANT
NEEDLE BEVEL SYR 25X1 (NEEDLE) ×1 IMPLANT
NEEDLE HYPO 25GX1X1/2 BEV (NEEDLE) ×1 IMPLANT
NS IRRIG 1000ML POUR BTL (IV SOLUTION) ×2 IMPLANT
PAD ARMBOARD 7.5X6 YLW CONV (MISCELLANEOUS) ×2 IMPLANT
PASSER CATH 38CM DISP (INSTRUMENTS) ×2 IMPLANT
PENCIL SMOKE EVACUATOR (MISCELLANEOUS) ×2 IMPLANT
POSITIONER HEAD DONUT 9IN (MISCELLANEOUS) ×2 IMPLANT
PROBE NERVE STIMULATOR (NEUROSURGERY SUPPLIES) ×2 IMPLANT
REMOTE CONTROL SLEEP INSPIRE (MISCELLANEOUS) ×2 IMPLANT
SET WALTER ACTIVATION W/DRAPE (SET/KITS/TRAYS/PACK) ×2 IMPLANT
SOL PREP POV-IOD 4OZ 10% (MISCELLANEOUS) IMPLANT
SPONGE INTESTINAL PEANUT (DISPOSABLE) ×2 IMPLANT
STAPLER VISISTAT 35W (STAPLE) ×2 IMPLANT
SUT SILK 2 0 SH (SUTURE) ×2 IMPLANT
SUT SILK 3 0 REEL (SUTURE) ×2 IMPLANT
SUT SILK 3 0 SH 30 (SUTURE) ×4 IMPLANT
SUT SILK 3-0 (SUTURE) ×1
SUT SILK 3-0 RB1 30XBRD (SUTURE) ×1
SUT VIC AB 3-0 SH 27 (SUTURE) ×2
SUT VIC AB 3-0 SH 27X BRD (SUTURE) ×4 IMPLANT
SUT VIC AB 4-0 PS2 27 (SUTURE) ×4 IMPLANT
SUTURE SILK 3-0 RB1 30XBRD (SUTURE) ×2 IMPLANT
SYR 10ML LL (SYRINGE) ×2 IMPLANT
TAPE CLOTH SURG 4X10 WHT LF (GAUZE/BANDAGES/DRESSINGS) ×2 IMPLANT
TOWEL GREEN STERILE (TOWEL DISPOSABLE) ×2 IMPLANT
TRAY ENT MC OR (CUSTOM PROCEDURE TRAY) ×2 IMPLANT

## 2022-04-16 NOTE — Anesthesia Postprocedure Evaluation (Signed)
Anesthesia Post Note  Patient: Omar Ruiz  Procedure(s) Performed: IMPLANTATION OF HYPOGLOSSAL NERVE STIMULATOR (Right: Neck)     Patient location during evaluation: PACU Anesthesia Type: General Level of consciousness: awake and alert Pain management: pain level controlled Vital Signs Assessment: post-procedure vital signs reviewed and stable Respiratory status: spontaneous breathing, nonlabored ventilation and respiratory function stable Cardiovascular status: blood pressure returned to baseline Postop Assessment: no apparent nausea or vomiting Anesthetic complications: no   No notable events documented.  Last Vitals:  Vitals:   04/16/22 1315 04/16/22 1332  BP: 131/87 138/82  Pulse: 86 84  Resp: 15 17  Temp:    SpO2: 93% 94%    Last Pain:  Vitals:   04/16/22 1246  TempSrc:   PainSc: 0-No pain                 Marthenia Rolling

## 2022-04-16 NOTE — H&P (Signed)
Omar Ruiz is an 71 y.o. male.   Chief Complaint: Sleep apnea HPI: 71 year old male with obstructive sleep apnea who has not been able to tolerate CPAP.  Past Medical History:  Diagnosis Date   Arthritis    CAD (coronary artery disease)    a. s/p DES to LAD in 2011 with low-risk NST in 02/2017   Cancer [facility])    skin cancer on hand, cancer removed   Chest pain 07/2004   2D Echo EF>55%   Hyperlipemia    Hypertension 10/2004   stress test EF 57%   Obesity    Palpitation    Sleep apnea     Past Surgical History:  Procedure Laterality Date   COLONOSCOPY  10/2004   COLONOSCOPY N/A 03/29/2013   Procedure: COLONOSCOPY;  Surgeon: [name], MD;  Location: [facility];  Service: Gastroenterology;  Laterality: N/A;   DRUG INDUCED ENDOSCOPY Bilateral 03/04/2022   Procedure: DRUG INDUCED ENDOSCOPY;  Surgeon: Melida Quitter, MD;  Location: [facility];  Service: ENT;  Laterality: Bilateral;   LEFT HEART CATH AND CORONARY ANGIOGRAPHY N/A 02/06/2022   Procedure: LEFT HEART CATH AND CORONARY ANGIOGRAPHY;  Surgeon: Belva Crome, MD;  Location: [facility];  Service: Cardiovascular;  Laterality: N/A;   MOUTH SURGERY     NASAL SINUS SURGERY     SKIN CANCER EXCISION     right hand    STENTS  09/05/2009   3.0x33m promus drug eluting stent for a 90% mid LAD artery stenosis   TOTAL KNEE ARTHROPLASTY Right 08/27/2020   Procedure: TOTAL KNEE ARTHROPLASTY;  Surgeon: [name] MD;  Location: [facility];  Service: Orthopedics;  Laterality: Right;  568m   TOTAL SHOULDER ARTHROPLASTY Right 12/24/2017   Procedure: RIGHT TOTAL SHOULDER ARTHROPLASTY;  Surgeon: ChTania AdeMD;  [place] [facility] Service: [place]ics;  Laterality: Right;    Family History  Problem Relation Age of Onset   Diabetes Mother    Heart disease Father    Lung disease Other    Asthma Other    Diabetes Other    CAD Brother        3 brothers hx of cad   Social History:  reports that he  quit smoking about 40 years ago. His smoking use included cigarettes. He has never used smokeless tobacco. He reports current alcohol use of about 12.0 standard drinks of alcohol per week. He reports that he does not use drugs.  Allergies: No Known Allergies  Facility-Administered Medications Prior to Admission  Medication Dose Route Frequency Provider Last Rate Last Admin   sodium chloride flush (NS) 0.9 % injection 3 mL  3 mL Intravenous Q12H DiMarylu Lund NP       Medications Prior to Admission  Medication Sig Dispense Refill   acetaminophen (TYLENOL) 500 MG tablet Take 1,000 mg by mouth daily as needed for moderate pain or mild pain.     albuterol (VENTOLIN HFA) 108 (90 Base) MCG/ACT inhaler Inhale 2 puffs into the lungs every 6 (six) hours as needed for wheezing or shortness of breath.     aspirin EC 81 MG tablet Take 81 mg by mouth daily. Swallow whole.     cholecalciferol (VITAMIN D3) 25 MCG (1000 UNIT) tablet Take 1,000 Units by mouth daily.     ezetimibe (ZETIA) 10 MG tablet Take 1 tablet (10 mg total) by mouth daily. 90 tablet 3   gabapentin (NEURONTIN) 100 MG capsule Take 100 mg  by mouth at bedtime.     metoprolol succinate (TOPROL XL) 25 MG 24 hr tablet Take 1.5 tablets (37.5 mg total) by mouth daily. 135 tablet 3   Misc Natural Products (YUMVS BEET ROOT-TART CHERRY PO) Take by mouth.     Multiple Vitamin (MULTIVITAMIN WITH MINERALS) TABS tablet Take 1 tablet by mouth daily.     naproxen (NAPROSYN) 500 MG tablet Take 500 mg by mouth 2 (two) times daily.     nitroGLYCERIN (NITROSTAT) 0.4 MG SL tablet Place 1 tablet (0.4 mg total) under the tongue every 5 (five) minutes as needed for chest pain. 25 tablet 3   sertraline (ZOLOFT) 25 MG tablet Take 25 mg by mouth in the morning.     simvastatin (ZOCOR) 40 MG tablet Take 40 mg by mouth in the morning.      No results found for this or any previous visit (from the past 48 hour(s)). No results found.  Review of Systems  All  other systems reviewed and are negative.   Blood pressure 118/76, pulse 66, temperature 97.7 F (36.5 C), temperature source Oral, resp. rate 17, height '6\' 1"'$  (1.854 m), weight 115.7 kg, SpO2 95 %. Physical Exam Constitutional:      Appearance: Normal appearance. He is normal weight.  HENT:     Head: Normocephalic and atraumatic.     Right Ear: External ear normal.     Left Ear: External ear normal.     Nose: Nose normal.     Mouth/Throat:     Mouth: Mucous membranes are moist.     Pharynx: Oropharynx is clear.  Eyes:     Extraocular Movements: Extraocular movements intact.     Conjunctiva/sclera: Conjunctivae normal.     Pupils: Pupils are equal, round, and reactive to light.  Cardiovascular:     Rate and Rhythm: Normal rate.  Pulmonary:     Effort: Pulmonary effort is normal.  Musculoskeletal:     Cervical back: Normal range of motion.  Skin:    General: Skin is warm and dry.  Neurological:     General: No focal deficit present.     Mental Status: He is alert and oriented to person, place, and time.  Psychiatric:        Mood and Affect: Mood normal.        Behavior: Behavior normal.        Thought Content: Thought content normal.        Judgment: Judgment normal.      Assessment/Plan Obstructive sleep apnea, BMI 33.64.  To OR for hypoglossal nerve stimulator placement.  Melida Quitter, MD 04/16/2022, 9:55 AM

## 2022-04-16 NOTE — Anesthesia Procedure Notes (Signed)
Procedure Name: Intubation Date/Time: 04/16/2022 10:40 AM  Performed by: Mosetta Pigeon, CRNAPre-anesthesia Checklist: Patient identified, Emergency Drugs available, Suction available and Patient being monitored Patient Re-evaluated:Patient Re-evaluated prior to induction Oxygen Delivery Method: Circle system utilized Preoxygenation: Pre-oxygenation with 100% oxygen Induction Type: IV induction Ventilation: Mask ventilation without difficulty Laryngoscope Size: Glidescope and 3 Grade View: Grade I Tube type: Oral Tube size: 7.5 mm Number of attempts: 1 Airway Equipment and Method: Rigid stylet and Video-laryngoscopy Placement Confirmation: ETT inserted through vocal cords under direct vision, positive ETCO2 and breath sounds checked- equal and bilateral Secured at: 23 cm Tube secured with: Tape Dental Injury: Teeth and Oropharynx as per pre-operative assessment  Comments: Glidescope used d/t limited neck movement

## 2022-04-16 NOTE — Addendum Note (Signed)
Addendum  created 04/16/22 1420 by Mosetta Pigeon, CRNA   Child order released for a procedure order, Clinical Note Signed, Intraprocedure Blocks edited, LDA created via procedure documentation, SmartForm saved

## 2022-04-16 NOTE — Brief Op Note (Signed)
04/16/2022  12:32 PM  PATIENT:  Omar Ruiz  71 y.o. male  PRE-OPERATIVE DIAGNOSIS:  Obstructive Sleep Apnea Body Mass Index 34.0-34.9,adult  POST-OPERATIVE DIAGNOSIS:  Obstructive Sleep Apnea Body Mass Index 34.0-34.9,adult  PROCEDURE:  Procedure(s): IMPLANTATION OF HYPOGLOSSAL NERVE STIMULATOR (Right)  SURGEON:  Surgeon(s) and Role:    Melida Quitter, MD - Primary  PHYSICIAN ASSISTANT:   ASSISTANTS: RNFA   ANESTHESIA:   general  EBL:  Minimal   BLOOD ADMINISTERED:none  DRAINS: none   LOCAL MEDICATIONS USED:  LIDOCAINE   SPECIMEN:  No Specimen  DISPOSITION OF SPECIMEN:  N/A  COUNTS:  YES  TOURNIQUET:  * No tourniquets in log *  DICTATION: .Note written in EPIC  PLAN OF CARE: Discharge to home after PACU  PATIENT DISPOSITION:  PACU - hemodynamically stable.   Delay start of Pharmacological VTE agent (>24hrs) due to surgical blood loss or risk of bleeding: no

## 2022-04-16 NOTE — Op Note (Signed)

## 2022-04-16 NOTE — Transfer of Care (Signed)
Immediate Anesthesia Transfer of Care Note  Patient: Omar Ruiz  Procedure(s) Performed: IMPLANTATION OF HYPOGLOSSAL NERVE STIMULATOR (Right: Neck)  Patient Location: PACU  Anesthesia Type:General  Level of Consciousness: awake, alert , oriented, and patient cooperative  Airway & Oxygen Therapy: Patient Spontanous Breathing and Patient connected to nasal cannula oxygen  Post-op Assessment: Report given to RN and Post -op Vital signs reviewed and stable  Post vital signs: stable  Last Vitals:  Vitals Value Taken Time  BP    Temp    Pulse 82 04/16/22 1252  Resp 9 04/16/22 1252  SpO2 94 % 04/16/22 1252  Vitals shown include unvalidated device data.  Last Pain:  Vitals:   04/16/22 0828  TempSrc:   PainSc: 0-No pain         Complications: No notable events documented.

## 2022-04-17 ENCOUNTER — Encounter (HOSPITAL_COMMUNITY): Payer: Self-pay | Admitting: Otolaryngology

## 2022-05-15 IMAGING — MR MR HEAD W/O CM
11 of 12 series · 41 of 48 positions shown · IV contrast (gadavist)
Comparison: Noncontrast CT head dated a prior

CLINICAL DATA: Dizziness and slurred speech starting at 7 p.m.



[Series 1: DWI · axial · 4.0mm · 0.96mm/px · z∈[-62,+76]mm · 5 of 36 slices shown (1 of 6)]
[im 1/36]
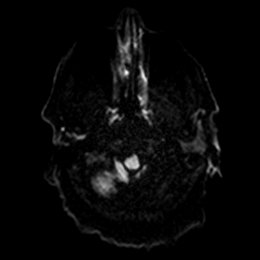
[im 9/36]
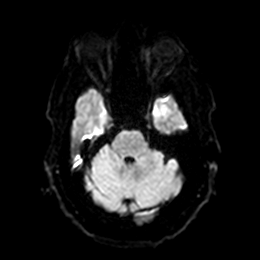
[im 18/36]
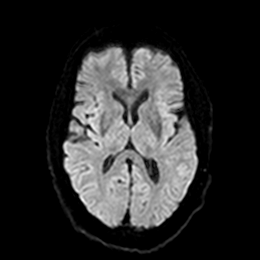
[im 27/36]
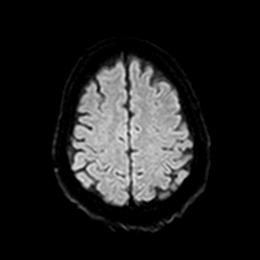
[im 36/36]
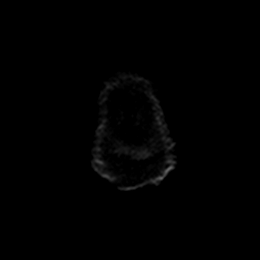

[Series 1: DWI · axial · 4.0mm · 0.96mm/px · z∈[-62,+76]mm · 4 of 36 slices shown (2 of 6)]
[im 1/36]
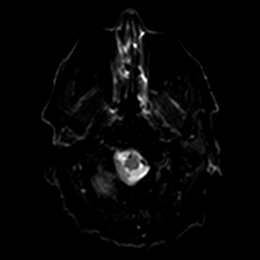
[im 12/36]
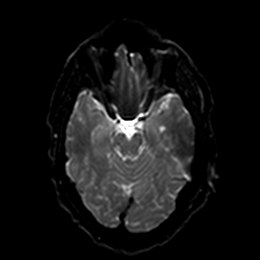
[im 24/36]
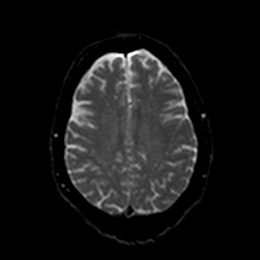
[im 36/36]
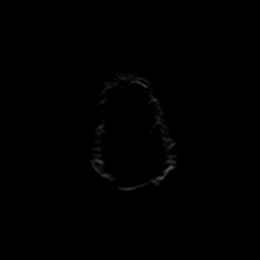

[Series 2: DWI · axial · 4.0mm · 0.96mm/px · z∈[-62,+76]mm · 4 of 36 slices shown (3 of 6)]
[im 1/36]
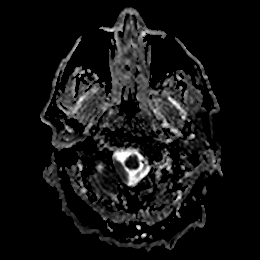
[im 12/36]
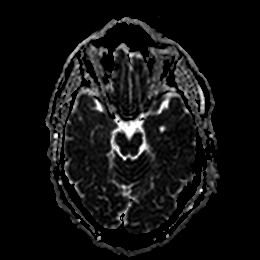
[im 24/36]
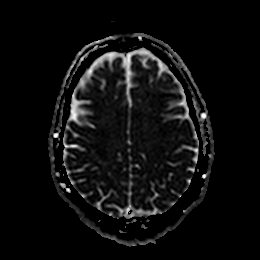
[im 36/36]
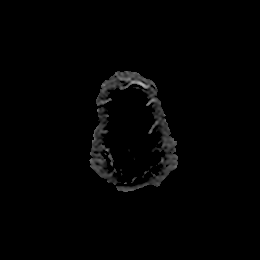

[Series 3: DWI · coronal · 4.0mm · 0.88mm/px · 4 of 36 slices shown (4 of 6)]
[im 1/36]
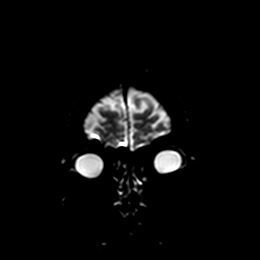
[im 12/36]
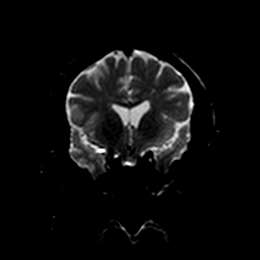
[im 24/36]
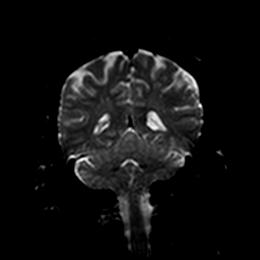
[im 36/36]
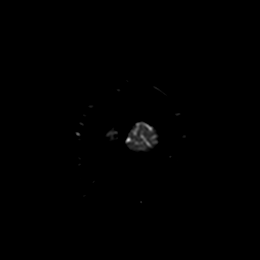

[Series 3: DWI · coronal · 4.0mm · 0.88mm/px · 4 of 36 slices shown (5 of 6)]
[im 1/36]
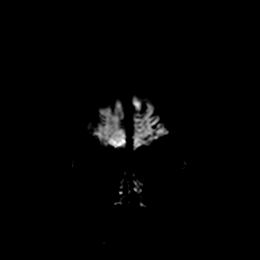
[im 12/36]
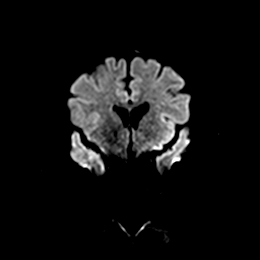
[im 24/36]
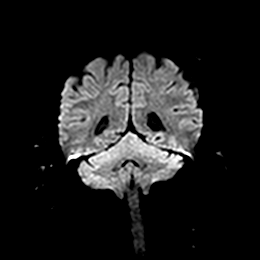
[im 36/36]
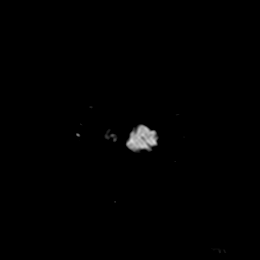

[Series 4: DWI · coronal · 4.0mm · 0.88mm/px · 4 of 36 slices shown (6 of 6)]
[im 1/36]
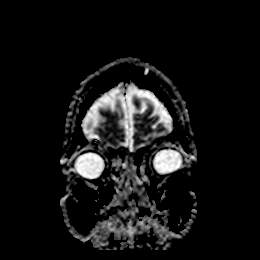
[im 12/36]
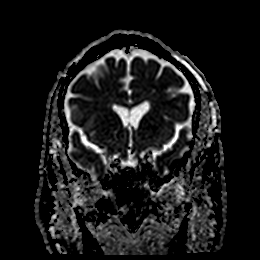
[im 24/36]
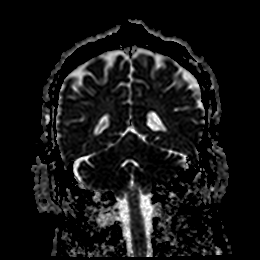
[im 36/36]
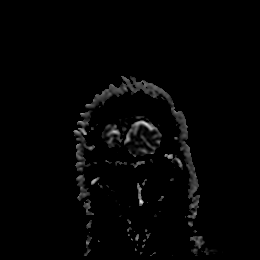

[Series 5: T1 · sagittal · 5.0mm · 0.87mm/px · 3 of 23 slices shown]
[im 1/23]
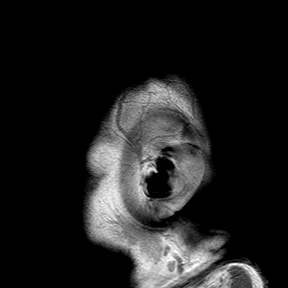
[im 12/23]
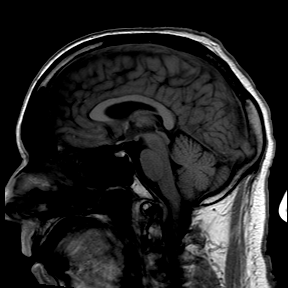
[im 23/23]
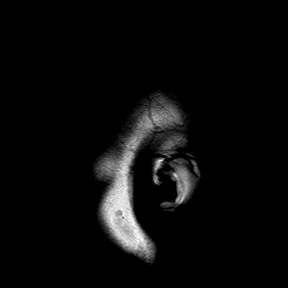

[Series 6: T2 · axial · 5.0mm · 0.72mm/px · z∈[-69,+82]mm · 3 of 23 slices shown (1 of 2)]
[im 1/23]
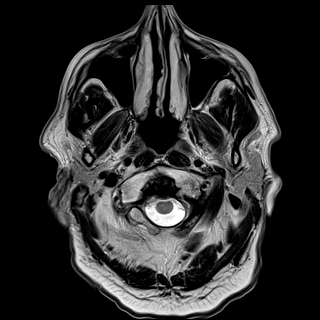
[im 12/23]
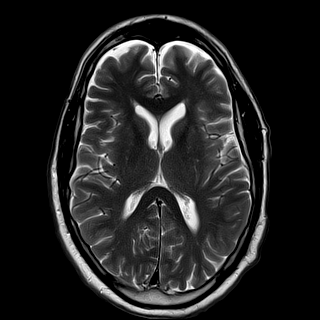
[im 23/23]
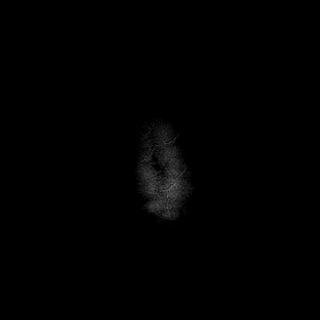

[Series 7: ax hemo · axial · 5.0mm · 0.86mm/px · z∈[-66,+75]mm · 3 of 25 slices shown]
[im 1/25]
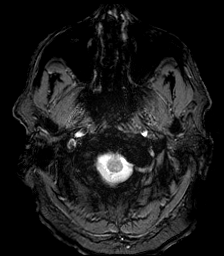
[im 13/25]
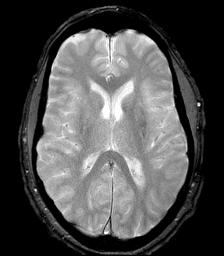
[im 25/25]
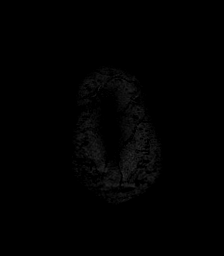

[Series 8: FLAIR · axial · 4.0mm · 0.43mm/px · z∈[-68,+77]mm · 4 of 38 slices shown]
[im 1/38]
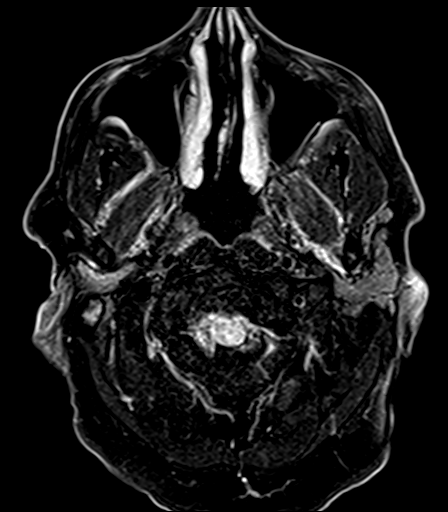
[im 13/38]
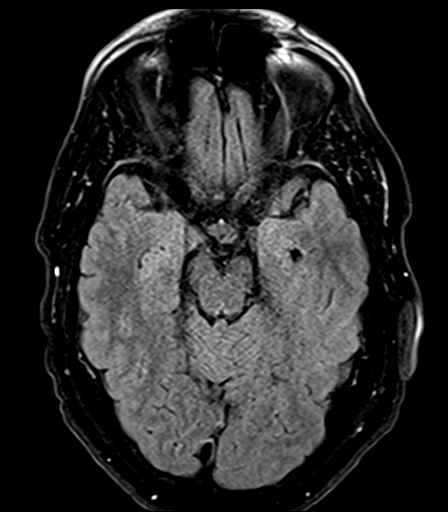
[im 25/38]
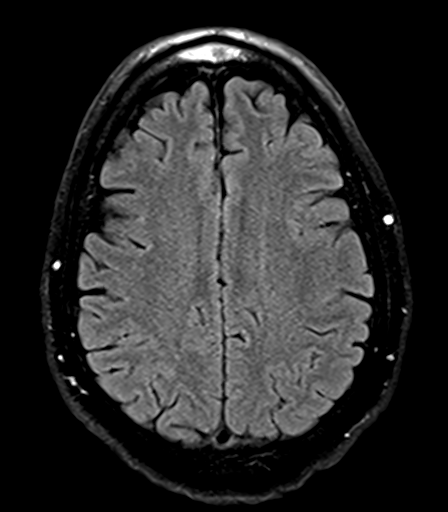
[im 38/38]
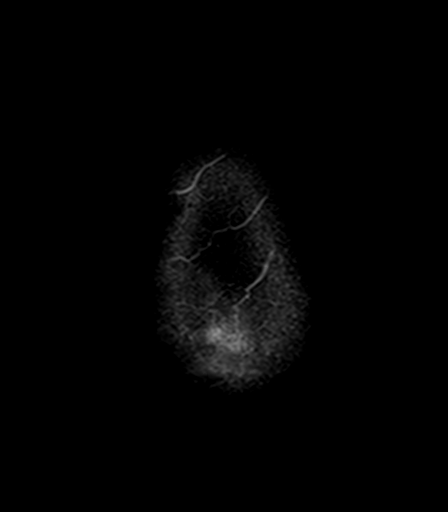

[Series 10: T2 · coronal · 5.0mm · 0.72mm/px · 3 of 28 slices shown (2 of 2)]
[im 1/28]
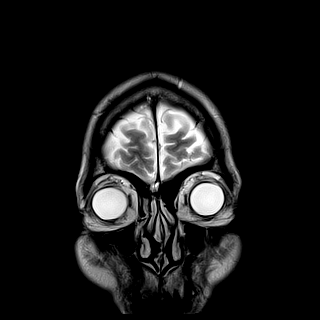
[im 14/28]
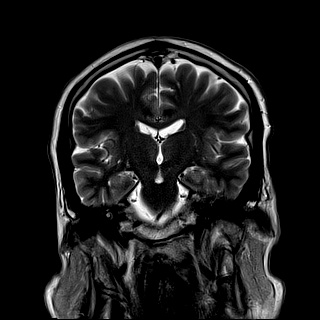
[im 28/28]
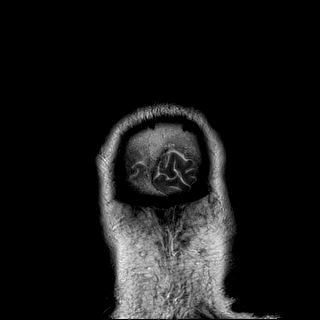

[41 of 48 positions shown; findings below may reference images not displayed]

FINDINGS: MRI HEAD FINDINGS

Brain: No acute intracranial, extra-axial fluid collection, or acute
infarct

Parenchymal volume is normal. The ventricles are normal in size.
Gray-white differentiation is preserved. Parenchymal signal is
normal, with no of white matter microangiopathic change.

There is no mass lesion.  There is no mass effect or midline shift.

Vascular: See below.

Skull and upper cervical spine: Normal marrow signal.

Sinuses/Orbits: The paranasal sinuses are clear. The globes are
unremarkable.

Other: None.

MRA HEAD FINDINGS

Anterior circulation: Intracranial ICAs are patent. The bilateral
MCAs are patent.

The bilateral ACAs are patent. The anterior communicating artery is
normal. There is no aneurysm.

Posterior circulation:

The bilateral V4 segments are patent. PICA or seen on the left not
well seen on the right. The basilar artery is patent.

The bilateral PCAs are patent. Prominent bilateral posterior
communicating arteries are identified.

There is no aneurysm or AVM.

Anatomic variants: None.

MRA NECK FINDINGS

Aortic arch: Imaged aortic arch is normal. The origins of the major
branch vessels are patent. The subclavian arteries are patent to the
level imaged.

Right carotid system: The right common, internal, and external
carotid arteries are patent common hemodynamically stenosis or
occlusion. There is no evidence of dissection or aneurysm.

Left carotid system: The left common, internal, and external carotid
arteries are patent, without hemodynamically significant stenosis
occlusion. There is evidence of dissection or aneurysm.

Vertebral arteries: Vertebral arteries with antegrade flow. There is
no hemodynamically significant or occlusion. There is no evidence of
dissection or aneurysm.

Other: None
IMPRESSION: 1. Normal noncontrast brain MRI.  No acute pathology
2. Normal vasculature of the head and neck no hemodynamically
stenosis or occlusion.

## 2022-05-15 IMAGING — DX DG CHEST 2V
2 series · 2 of 2 positions shown · non-contrast
Comparison: Radiographs 12/16/2017 and 09/13/2015.

CLINICAL DATA: Shortness of breath.  Asthma/COPD.  Ex-smoker.

EXAM:
CHEST - 2 VIEW

[chest pa]
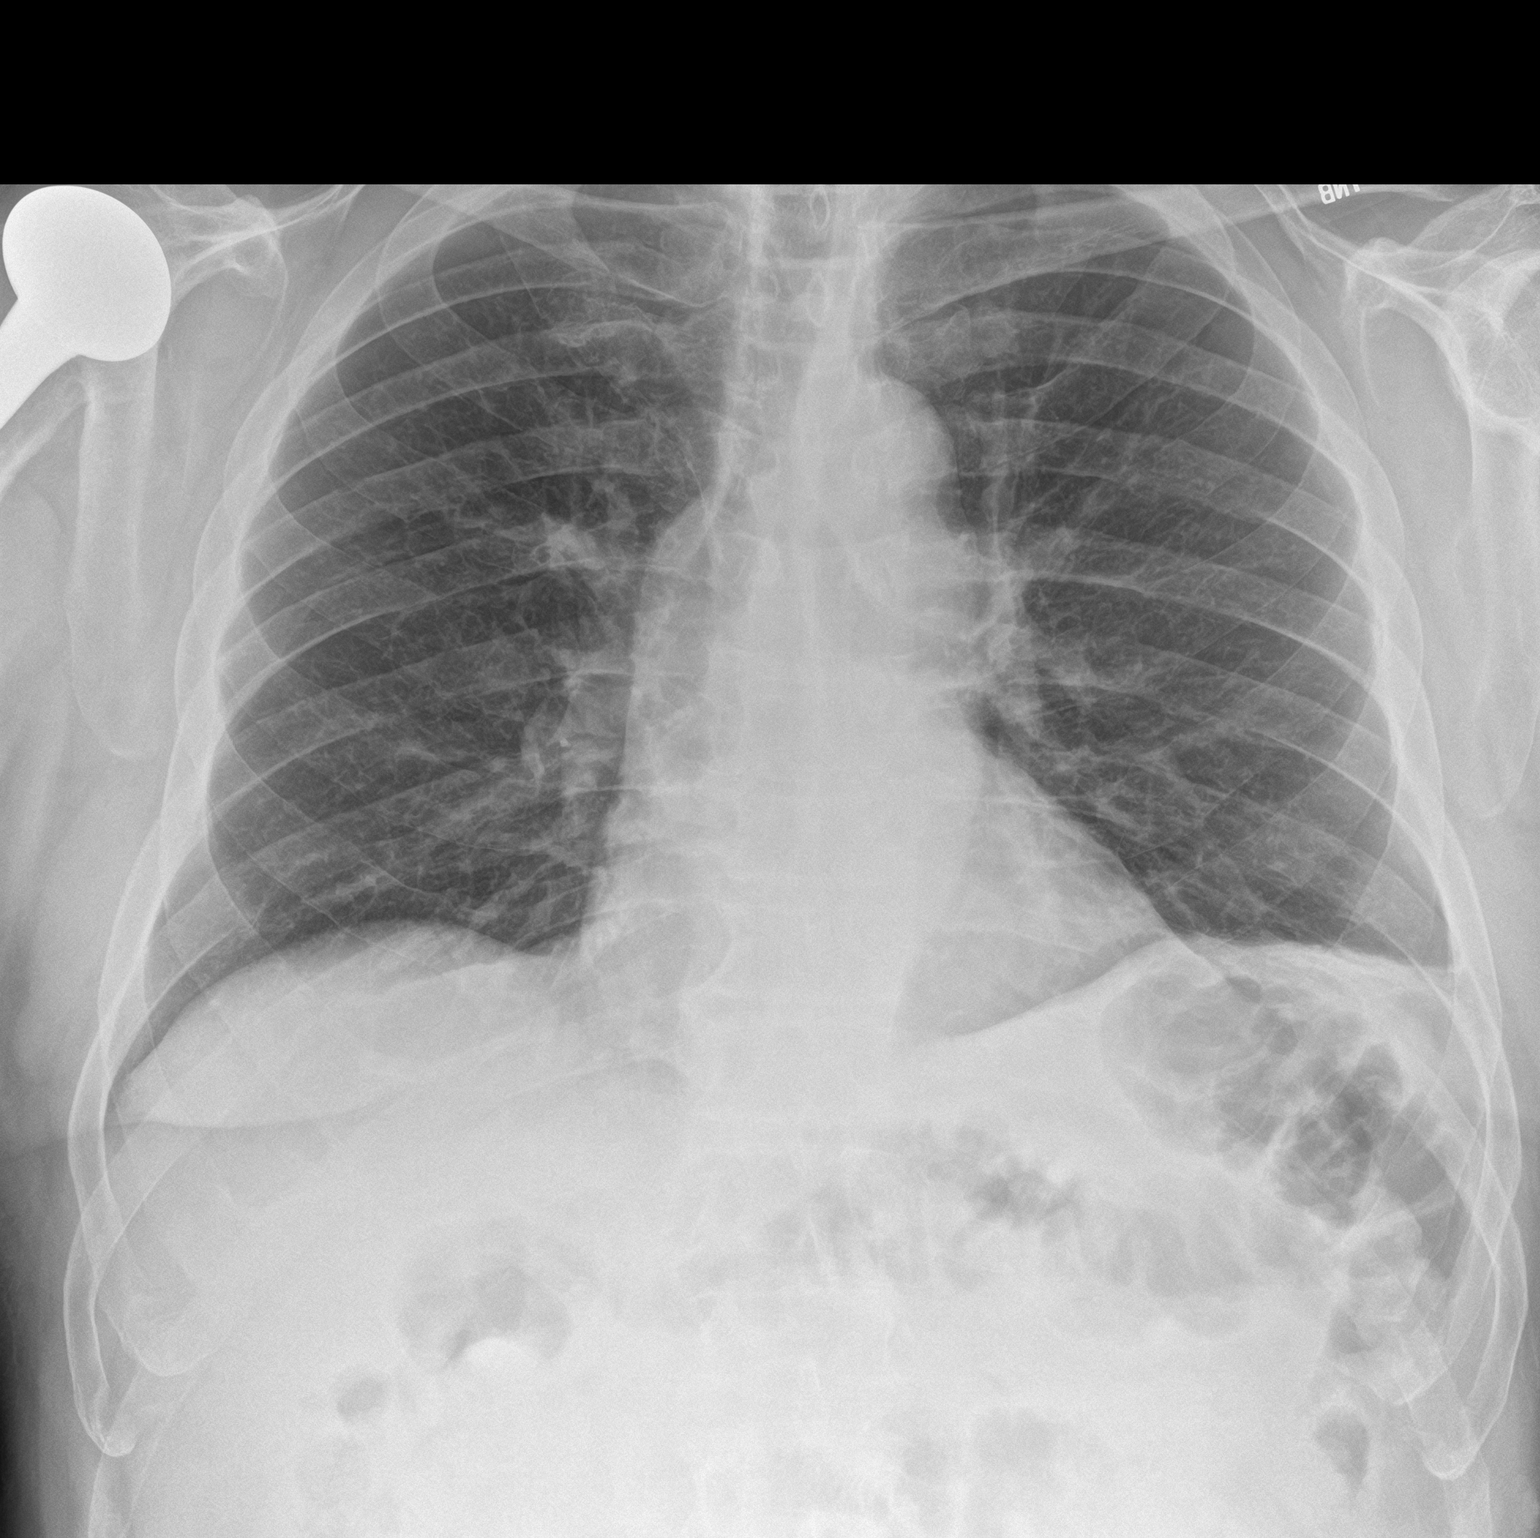

[chest lat]
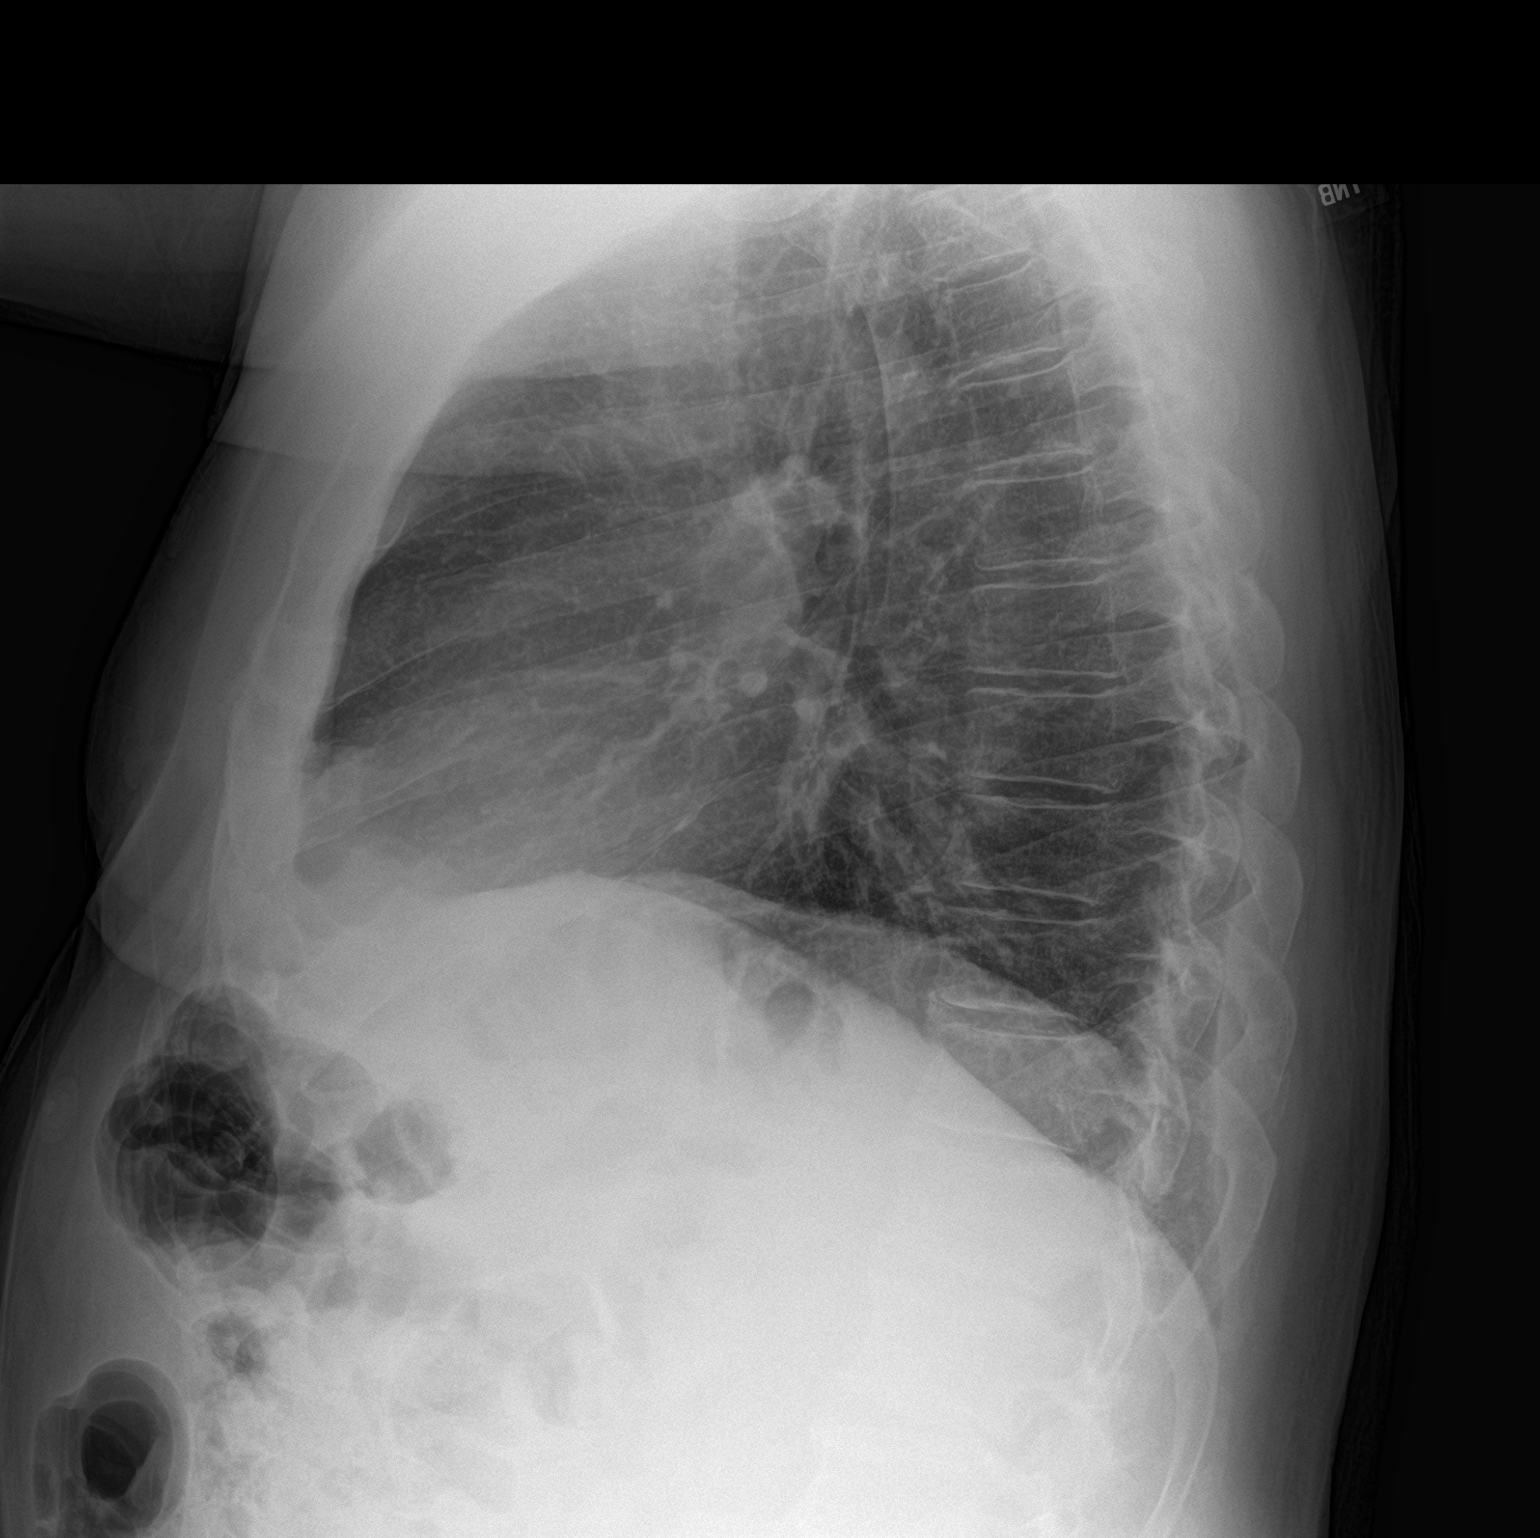

[2 of 2 positions shown; findings below may reference images not displayed]

FINDINGS: The heart size and mediastinal contours are stable. There is mild
scarring laterally at the left lung base which appears unchanged.
The lungs are otherwise clear. No pleural effusion or pneumothorax.
No acute osseous findings. Interval right total shoulder
arthroplasty.
IMPRESSION: No evidence of active cardiopulmonary process.

## 2022-06-03 ENCOUNTER — Ambulatory Visit (HOSPITAL_BASED_OUTPATIENT_CLINIC_OR_DEPARTMENT_OTHER): Payer: Medicare PPO | Admitting: Pulmonary Disease

## 2022-06-04 ENCOUNTER — Other Ambulatory Visit (HOSPITAL_COMMUNITY)
Admission: RE | Admit: 2022-06-04 | Discharge: 2022-06-04 | Disposition: A | Discharge status: Home / Self Care | Payer: Medicare PPO | Source: Ambulatory Visit | Attending: Internal Medicine | Admitting: Internal Medicine

## 2022-06-04 DIAGNOSIS — I2510 Atherosclerotic heart disease of native coronary artery without angina pectoris: Secondary | ICD-10-CM | POA: Diagnosis present

## 2022-06-04 DIAGNOSIS — E782 Mixed hyperlipidemia: Secondary | ICD-10-CM | POA: Diagnosis not present

## 2022-06-04 LAB — LIPID PANEL
Cholesterol: 130 mg/dL (ref 0–200)
HDL: 48 mg/dL (ref >40)
LDL Cholesterol: 60 mg/dL (ref 0–99)
Total CHOL/HDL Ratio: 2.7 RATIO
Triglycerides: 108 mg/dL (ref <150)
VLDL: 22 mg/dL (ref 0–40)

## 2022-06-04 LAB — ALT: ALT: 24 U/L (ref 0–44)

## 2022-06-07 ENCOUNTER — Encounter: Payer: Self-pay | Admitting: Physician Assistant

## 2022-06-07 NOTE — Progress Notes (Unsigned)
Cardiology Office Note    Date:  06/09/2022   ID:  Omar Ruiz, DOB [birth year], MRN [id number]  PCP:  [name], NP  Cardiologist:  Werner Lean, MD  Electrophysiologist:  None   Chief Complaint: f/u CAD  History of Present Illness:   Omar Ruiz is a 72 y.o. male with history of CAD s/p DES to LAD 2011, HTN, HLD (prior myalgia on Crestor), OSA managed by [facility] (recent Inspire placed), possible TIA,, dilation of aorta, PSVT/PACs/PVCs on monitor who is seen for followup.   He had prior PCI in 2011 as outlined with interim periodic stress testing since then that was reassuring. In 09/2021 he was admitted with vertigo and slurred speech - stroke was ruled out, possible TIA vs peripheral vertigo -> tx with ASA + Plavix x 3 weeks then ASA '81mg'$  daily. He also had a mechanical fall in 12/2021 with incidental pickup of dilation of aortic root and ascending segment (4.2, 4.0cm respectively). He saw Ambrose Pancoast in follow-up 01/2022 with intermittent chest pressure and palpitations. Cor CT was challenging given prior stent. This also incidentally noted compression fx. Monitor showed predominantly NSR 53-197bpm, avg 73bpm, 20 runs of PSVT (17 sec max), rare PACs/PVCs. Due to inconclusive CT, was set up for cath showing 30-40% mLAD, 30% distal ISR of LAD stent, 30-40% Cx, EF 55%, LVEDP normal. Saw Dr. Gasper Sells in follow-up and was doing well, metoprolol increased and Zetia added.  He is seen back for follow-up doing well. Palpitations are not really bothersome at this point. No CP or SOB reported. His main complaint was recently having to participate in PT as the fall he had in August messed up his knee for which he had surgery on many years ago. He also reports his Inspire device will be turned on soon.   Labwork independently reviewed: 06/04/22 LDL 60, trig 108, ALT wnl 03/2022 CBC wnl, Cr 0.92, K 4.2 12/2021 alb 3.8, AST ALT OK 09/2021 TSH Wnl, A1c 5.8  Past History    Past Medical History:  Diagnosis Date   Arthritis    CAD (coronary artery disease)    a. s/p DES to LAD in 2011 with low-risk NST in 02/2017   Cancer [facility])    skin cancer on hand, cancer removed   Chest pain 07/2004   2D Echo EF>55%   Hyperlipemia    Hypertension 10/2004   stress test EF 57%   Obesity    Palpitation    PSVT (paroxysmal supraventricular tachycardia)    Sleep apnea     Past Surgical History:  Procedure Laterality Date   COLONOSCOPY  10/2004   COLONOSCOPY N/A 03/29/2013   Procedure: COLONOSCOPY;  Surgeon: [name], MD;  [facility];  Service: Gastroenterology;  Laterality: N/A;   DRUG INDUCED ENDOSCOPY Bilateral 03/04/2022   Procedure: DRUG INDUCED ENDOSCOPY;  Surgeon: Melida Quitter, MD;  Location: [place];  Service: ENT;  Laterality: Bilateral;   IMPLANTATION OF HYPOGLOSSAL NERVE STIMULATOR Right 04/16/2022   Procedure: IMPLANTATION OF HYPOGLOSSAL NERVE STIMULATOR;  Surgeon: Melida Quitter, MD;  Location: [place];  Service: ENT;  Laterality: Right;   LEFT HEART CATH AND CORONARY ANGIOGRAPHY N/A 02/06/2022   Procedure: LEFT HEART CATH AND CORONARY ANGIOGRAPHY;  Surgeon: Belva Crome, MD;  Location: [facility];  Service: Cardiovascular;  Laterality: N/A;   MOUTH SURGERY     NASAL SINUS SURGERY     SKIN CANCER EXCISION     right hand  STENTS  09/05/2009   3.0x58m promus drug eluting stent for a 90% mid LAD artery stenosis   TOTAL KNEE ARTHROPLASTY Right 08/27/2020   Procedure: TOTAL KNEE ARTHROPLASTY;  Surgeon: [name] MD;  Location: [facility];  Service: Orthopedics;  Laterality: Right;  582m   TOTAL SHOULDER ARTHROPLASTY Right 12/24/2017   Procedure: RIGHT TOTAL SHOULDER ARTHROPLASTY;  Surgeon: ChTania AdeMD;  [facility] Service: [facility]opedics;  Laterality: Right;    Current Medications: Current Meds  Medication Sig   acetaminophen (TYLENOL) 500 MG tablet Take 1,000 mg by mouth daily as needed for  moderate pain or mild pain.   albuterol (VENTOLIN HFA) 108 (90 Base) MCG/ACT inhaler Inhale 2 puffs into the lungs every 6 (six) hours as needed for wheezing or shortness of breath.   aspirin EC 81 MG tablet Take 81 mg by mouth daily. Swallow whole.   cholecalciferol (VITAMIN D3) 25 MCG (1000 UNIT) tablet Take 1,000 Units by mouth daily.   ezetimibe (ZETIA) 10 MG tablet Take 1 tablet (10 mg total) by mouth daily.   metoprolol succinate (TOPROL XL) 25 MG 24 hr tablet Take 1.5 tablets (37.5 mg total) by mouth daily.   Misc Natural Products (YUMVS BEET ROOT-TART CHERRY PO) Take by mouth.   Multiple Vitamin (MULTIVITAMIN WITH MINERALS) TABS tablet Take 1 tablet by mouth daily.   naproxen (NAPROSYN) 500 MG tablet Take 500 mg by mouth 2 (two) times daily.   nitroGLYCERIN (NITROSTAT) 0.4 MG SL tablet Place 1 tablet (0.4 mg total) under the tongue every 5 (five) minutes as needed for chest pain.   sertraline (ZOLOFT) 25 MG tablet Take 25 mg by mouth in the morning.   simvastatin (ZOCOR) 40 MG tablet Take 40 mg by mouth in the morning.    Allergies:   Patient has no known allergies.   Social History   Socioeconomic History   Marital status: Married    Spouse name: Not on file   Number of children: 2   Years of education: 12   Highest education level: Not on file  Occupational History   Not on file  Tobacco Use   Smoking status: Former    Types: Cigarettes    Quit date: 05/12/1981    Years since quitting: 41.1   Smokeless tobacco: Never  Vaping Use   Vaping Use: Never used  Substance and Sexual Activity   Alcohol use: Yes    Alcohol/week: 12.0 standard drinks of alcohol    Types: 12 Cans of beer per week   Drug use: No   Sexual activity: Yes  Other Topics Concern   Not on file  Social History Narrative   Not on file   Social Determinants of Health   Financial Resource Strain: Not on file  Food Insecurity: Not on file  Transportation Needs: Not on file  Physical Activity: Not on  file  Stress: Not on file  Social Connections: Not on file     Family History:  The patient's family history includes Asthma in an other family member; CAD in his brother; Diabetes in his mother and another family member; Heart disease in his father; Lung disease in an other family member.  ROS:   Please see the history of present illness.  All other systems are reviewed and otherwise negative.    EKG(s)/Additional Testing   EKG:  EKG is not ordered today  CV Studies: Cardiac studies reviewed are outlined and summarized above. Otherwise please see EMR for full report.  Recent  Labs: 09/13/2021: Magnesium 2.1; TSH 3.824 04/09/2022: BUN 13; Creatinine, Ser 0.92; Hemoglobin 14.0; Platelets 215; Potassium 4.2; Sodium 141 06/04/2022: ALT 24  Recent Lipid Panel    Component Value Date/Time   CHOL 130 06/04/2022 0842   TRIG 108 06/04/2022 0842   HDL 48 06/04/2022 0842   CHOLHDL 2.7 06/04/2022 0842   VLDL 22 06/04/2022 0842   LDLCALC 60 06/04/2022 0842    PHYSICAL EXAM:    VS:  BP 124/80   Pulse 67   Ht '6\' 1"'$  (1.854 m)   Wt 261 lb (118.4 kg)   SpO2 94%   BMI 34.43 kg/m   BMI: Body mass index is 34.43 kg/m.  GEN: Well nourished, well developed male in no acute distress HEENT: normocephalic, atraumatic Neck: no JVD, carotid bruits, or masses Cardiac: RRR; no murmurs, rubs, or gallops, no edema  Respiratory:  clear to auscultation bilaterally, normal work of breathing GI: soft, nontender, nondistended, + BS MS: no deformity or atrophy Skin: warm and dry, no rash Neuro:  Alert and Oriented x 3, Strength and sensation are intact, follows commands Psych: euthymic mood, full affect  Wt Readings from Last 3 Encounters:  06/09/22 261 lb (118.4 kg)  04/16/22 255 lb (115.7 kg)  04/09/22 258 lb (117 kg)     ASSESSMENT & PLAN:   1. CAD with HLD - doing well without recurrent angina. Continue ASA, BB, simvastatin, ezetimibe. Discussed risk of bleeding while on NSAIDs with ASA  (naproxen on list), discussed talking to PCP about alternatives for pain control if needed.  2. Essential HTN - controlled on Toprol.  3. Dilation of aorta - discussed finding with patient. Noted on CT 12/2021 but not mentioned on cor CT 01/2022. We will repeat CT angio aorta in 12/2022 to trend. Also discussed screening of first degree relatives and avoidance of fluoroquinolones when possible as he does have a family hx of aneurysm. BP is controlled. Continue BB.  4. PSVT - improved palpitations on current metoprolol dose, continue for now. Patient will notify if palpitations worsen.  5. Possible h/o TIA - he indicates he has since followed up with neurology. He remains on ASA at this time.     Disposition: F/u with Dr. Gasper Sells or myself in 01/2023 after 12/2022 CT. BMET should be obtained before CTA.   Medication Adjustments/Labs and Tests Ordered: Current medicines are reviewed at length with the patient today.  Concerns regarding medicines are outlined above. Medication changes, Labs and Tests ordered today are summarized above and listed in the Patient Instructions accessible in Encounters.    Signed, Charlie Pitter, PA-C  06/09/2022 1:52 PM    [facility] Location in [place]. [place], [place] [address] Ph: [phone]; Fax [phone]

## 2022-06-09 ENCOUNTER — Encounter: Payer: Self-pay | Admitting: Physician Assistant

## 2022-06-09 ENCOUNTER — Ambulatory Visit: Payer: Medicare PPO | Attending: Physician Assistant | Admitting: Physician Assistant

## 2022-06-09 VITALS — BP 124/80 | HR 67 | Ht 73.0 in | Wt 261.0 lb

## 2022-06-09 DIAGNOSIS — I10 Essential (primary) hypertension: Secondary | ICD-10-CM

## 2022-06-09 DIAGNOSIS — I77819 Aortic ectasia, unspecified site: Secondary | ICD-10-CM

## 2022-06-09 DIAGNOSIS — I2510 Atherosclerotic heart disease of native coronary artery without angina pectoris: Secondary | ICD-10-CM | POA: Diagnosis not present

## 2022-06-09 DIAGNOSIS — G459 Transient cerebral ischemic attack, unspecified: Secondary | ICD-10-CM

## 2022-06-09 DIAGNOSIS — E785 Hyperlipidemia, unspecified: Secondary | ICD-10-CM

## 2022-06-09 NOTE — Patient Instructions (Addendum)
Since you mentioned that aneurysms run in your family, I would also suggest that you discuss this diagnosis with first degree relatives so they get screened. Given the enlargement seen of your aorta, I would suggest to avoid antibiotics in the fluoroquinolone class.  Medication Instructions:  Your physician recommends that you continue on your current medications as directed. Please refer to the Current Medication list given to you today.  *If you need a refill on your cardiac medications before your next appointment, please call your pharmacy*   Lab Work: NONE   If you have labs (blood work) drawn today and your tests are completely normal, you will receive your results only by: [place] (if you have MyChart) OR A paper copy in the mail If you have any lab test that is abnormal or we need to change your treatment, we will call you to review the results.   Testing/Procedures: Non-Cardiac CT Angiography (CTA), is a special type of CT scan that uses a computer to produce multi-dimensional views of major blood vessels throughout the body. In CT angiography, a contrast material is injected through an IV to help visualize the blood vessels    Follow-Up: At [facility], you and your health needs are our priority.  As part of our continuing mission to provide you with exceptional heart care, we have created designated Provider Care Teams.  These Care Teams include your primary Cardiologist (physician) and Advanced Practice Providers (APPs -  Physician Assistants and Nurse Practitioners) who all work together to provide you with the care you need, when you need it.  We recommend signing up for the patient portal called "MyChart".  Sign up information is provided on this After Visit Summary.  MyChart is used to connect with patients for Virtual Visits (Telemedicine).  Patients are able to view lab/test results, encounter notes, upcoming appointments, etc.  Non-urgent messages can be  sent to your provider as well.   To learn more about what you can do with MyChart, go to [email or web address].    Your next appointment:    September   Provider:   You may see Werner Lean, MD or one of the following Advanced Practice Providers on your designated Care Team:   Melina Copa, PA-C   Other Instructions Thank you for choosing [facility]!

## 2022-06-24 ENCOUNTER — Ambulatory Visit (INDEPENDENT_AMBULATORY_CARE_PROVIDER_SITE_OTHER): Payer: Medicare PPO | Admitting: Pulmonary Disease

## 2022-06-24 ENCOUNTER — Encounter (HOSPITAL_BASED_OUTPATIENT_CLINIC_OR_DEPARTMENT_OTHER): Payer: Self-pay | Admitting: Pulmonary Disease

## 2022-06-24 VITALS — BP 132/70 | HR 64 | Temp 98.6°F | Wt 261.0 lb

## 2022-06-24 DIAGNOSIS — G4733 Obstructive sleep apnea (adult) (pediatric): Secondary | ICD-10-CM

## 2022-06-24 NOTE — Patient Instructions (Signed)
Follow-up in 4 weeks

## 2022-06-24 NOTE — Progress Notes (Signed)
[facility]  Chief Complaint  Patient presents with   Follow-up    Pt is here to get his Dawna Part activiated today. Pt states it was implanted 04/16/22    Past Surgical History:  He  has a past surgical history that includes STENTS (09/05/2009); Colonoscopy (10/2004); Colonoscopy (N/A, 03/29/2013); Nasal sinus surgery; Skin cancer excision; Total shoulder arthroplasty (Right, 12/24/2017); Mouth surgery; Total knee arthroplasty (Right, 08/27/2020); LEFT HEART CATH AND CORONARY ANGIOGRAPHY (N/A, 02/06/2022); Drug induced endoscopy (Bilateral, 03/04/2022); and Implantation of hypoglossal nerve stimulator (Right, 04/16/2022).  Past Medical History:  Osteoarthritis, CAD s/p stent, HLD, HTN, Depression  Constitutional:  BP 132/70 (BP Location: Left Arm, Patient Position: Sitting, Cuff Size: Normal)   Pulse 64   Temp 98.6 F (37 C) (Oral)   Wt 261 lb (118.4 kg)   SpO2 94%   BMI 34.43 kg/m   Brief Summary:  Omar Ruiz is a 72 y.o. male former smoker with obstructive sleep apnea.      Subjective:   He is here with his wife.  He had Inspire device implanted 04/16/22.    Device set up completed, and set a 1 V.  He briefly had throat tightness but this resolved with adjustment in voltage.   Physical Exam:   Appearance - well kempt   ENMT - no sinus tenderness, no oral exudate, no LAN, Mallampati 4 airway, no stridor, mild TMJ click  Respiratory - equal breath sounds bilaterally, no wheezing or rales  CV - s1s2 regular rate and rhythm, no murmurs  Ext - no clubbing, no edema  Skin - no rashes  Psych - normal mood and affect   Pulmonary testing:  PFT 03/18/17 >> FEV1 3.38 (87%), FEV1% 81, TLC 7.19 (94%), DLCO 79%  Sleep Tests:  HST 01/14/22 >> AHI 16.4, SpO2 low 70%  Cardiac Tests:  Echo 09/13/21 >> EF 65 to 70%, mild LVH, severe RA dilation, aortic root 37 mm  Social History:  He  reports that he quit smoking about 41  years ago. His smoking use included cigarettes. He has never used smokeless tobacco. He reports current alcohol use of about 12.0 standard drinks of alcohol per week. He reports that he does not use drugs.  Family History:  His family history includes Asthma in an other family member; CAD in his brother; Diabetes in his mother and another family member; Heart disease in his father; Lung disease in an other family member.     Assessment/Plan:   Obstructive sleep apnea. - intolerant of CPAP - Inspire device implanted 04/16/22 - Device activated 06/24/22 - he will need in lab study in about 2 months  Coronary artery disease. - followed by Dr. Rudean Haskell with cardiology  Obesity. - discussed how weight can impact sleep and risk for sleep disordered breathing - discussed options to assist with weight loss: combination of diet modification, cardiovascular and strength training exercises  Time Spent Involved in Patient Care on Day of Examination:  35 minutes  Follow up:   Patient Instructions  Follow up in 4 weeks  Medication List:   Allergies as of 06/24/2022   No Known Allergies      Medication List        Accurate as of June 24, 2022  3:07 PM. If you have any questions, ask your nurse or doctor.          STOP taking these medications    gabapentin 100 MG capsule Commonly known as:  NEURONTIN Stopped by: Chesley Mires, MD   HYDROcodone-acetaminophen 5-325 MG tablet Commonly known as: Norco Stopped by: Chesley Mires, MD       TAKE these medications    acetaminophen 500 MG tablet Commonly known as: TYLENOL Take 1,000 mg by mouth daily as needed for moderate pain or mild pain.   albuterol 108 (90 Base) MCG/ACT inhaler Commonly known as: VENTOLIN HFA Inhale 2 puffs into the lungs every 6 (six) hours as needed for wheezing or shortness of breath.   aspirin EC 81 MG tablet Take 81 mg by mouth daily. Swallow whole.   cholecalciferol 25 MCG (1000  UNIT) tablet Commonly known as: VITAMIN D3 Take 1,000 Units by mouth daily.   ezetimibe 10 MG tablet Commonly known as: ZETIA Take 1 tablet (10 mg total) by mouth daily.   metoprolol succinate 25 MG 24 hr tablet Commonly known as: Toprol XL Take 1.5 tablets (37.5 mg total) by mouth daily.   multivitamin with minerals Tabs tablet Take 1 tablet by mouth daily.   naproxen 500 MG tablet Commonly known as: NAPROSYN Take 500 mg by mouth 2 (two) times daily.   nitroGLYCERIN 0.4 MG SL tablet Commonly known as: NITROSTAT Place 1 tablet (0.4 mg total) under the tongue every 5 (five) minutes as needed for chest pain.   sertraline 25 MG tablet Commonly known as: ZOLOFT Take 25 mg by mouth in the morning.   simvastatin 40 MG tablet Commonly known as: ZOCOR Take 40 mg by mouth in the morning.   YUMVS BEET ROOT-TART CHERRY PO Take by mouth.        Signature:  Chesley Mires, MD [place] Pager - [phone] 06/24/2022, 3:07 PM

## 2022-06-25 ENCOUNTER — Encounter: Payer: Self-pay | Admitting: Pulmonary Disease

## 2022-07-17 ENCOUNTER — Ambulatory Visit (INDEPENDENT_AMBULATORY_CARE_PROVIDER_SITE_OTHER): Payer: Medicare PPO | Admitting: Pulmonary Disease

## 2022-07-17 ENCOUNTER — Encounter (HOSPITAL_BASED_OUTPATIENT_CLINIC_OR_DEPARTMENT_OTHER): Payer: Self-pay | Admitting: Pulmonary Disease

## 2022-07-17 VITALS — BP 132/80 | HR 76 | Ht 73.0 in | Wt 261.4 lb

## 2022-07-17 DIAGNOSIS — G4733 Obstructive sleep apnea (adult) (pediatric): Secondary | ICD-10-CM

## 2022-07-17 DIAGNOSIS — Z789 Other specified health status: Secondary | ICD-10-CM | POA: Diagnosis not present

## 2022-07-17 NOTE — Patient Instructions (Signed)
Follow up in 8 weeks 

## 2022-07-17 NOTE — Progress Notes (Signed)
[facility]  Chief Complaint  Patient presents with   Follow-up    Pt states he has been doing okay since last visit. Has some issues with the inspire.    Past Surgical History:  He  has a past surgical history that includes STENTS (09/05/2009); Colonoscopy (10/2004); Colonoscopy (N/A, 03/29/2013); Nasal sinus surgery; Skin cancer excision; Total shoulder arthroplasty (Right, 12/24/2017); Mouth surgery; Total knee arthroplasty (Right, 08/27/2020); LEFT HEART CATH AND CORONARY ANGIOGRAPHY (N/A, 02/06/2022); Drug induced endoscopy (Bilateral, 03/04/2022); and Implantation of hypoglossal nerve stimulator (Right, 04/16/2022).  Past Medical History:  Osteoarthritis, CAD s/p stent, HLD, HTN, Depression  Constitutional:  BP 132/80 (BP Location: Left Arm, Patient Position: Sitting, Cuff Size: Normal)   Pulse 76   Ht '6\' 1"'$  (1.854 m)   Wt 261 lb 5.7 oz (118.6 kg)   SpO2 98% Comment: RA  BMI 34.48 kg/m   Brief Summary:  Omar Ruiz is a 72 y.o. male former smoker with obstructive sleep apnea.      Subjective:   Here with Inspire team.  Has setting from 1.3 to 2 V.  Tolerating well.  Sleeping better.  Doesn't need to nap anymore.  Still snores some, but not as loud.  Has noticed episodes of drooling.  Physical Exam:   Appearance - well kempt   ENMT - no sinus tenderness, no oral exudate, no LAN, Mallampati 4 airway, no stridor, mild TMJ click  Respiratory - equal breath sounds bilaterally, no wheezing or rales  CV - s1s2 regular rate and rhythm, no murmurs  Ext - no clubbing, no edema  Skin - no rashes  Psych - normal mood and affect   Pulmonary testing:  PFT 03/18/17 >> FEV1 3.38 (87%), FEV1% 81, TLC 7.19 (94%), DLCO 79%  Sleep Tests:  HST 01/14/22 >> AHI 16.4, SpO2 low 70%  Cardiac Tests:  Echo 09/13/21 >> EF 65 to 70%, mild LVH, severe RA dilation, aortic root 37 mm  Social History:  He  reports that he quit smoking about  41 years ago. His smoking use included cigarettes. He has never used smokeless tobacco. He reports current alcohol use of about 12.0 standard drinks of alcohol per week. He reports that he does not use drugs.  Family History:  His family history includes Asthma in an other family member; CAD in his brother; Diabetes in his mother and another family member; Heart disease in his father; Lung disease in an other family member.     Assessment/Plan:   Obstructive sleep apnea. - intolerant of CPAP - Inspire device implanted 04/16/22 - Device activated 06/24/22 - f/u in 8 to 10 weeks, and if doing okay will then schedule in lab sleep study  Coronary artery disease. - followed by Dr. Rudean Haskell with cardiology  Obesity. - discussed how weight can impact sleep and risk for sleep disordered breathing - discussed options to assist with weight loss: combination of diet modification, cardiovascular and strength training exercises  Time Spent Involved in Patient Care on Day of Examination:  26 minutes  Follow up:   Patient Instructions  Follow up in 8 weeks  Medication List:   Allergies as of 07/17/2022   No Known Allergies      Medication List        Accurate as of July 17, 2022 11:32 AM. If you have any questions, ask your nurse or doctor.          acetaminophen 500 MG tablet Commonly known  as: TYLENOL Take 1,000 mg by mouth daily as needed for moderate pain or mild pain.   albuterol 108 (90 Base) MCG/ACT inhaler Commonly known as: VENTOLIN HFA Inhale 2 puffs into the lungs every 6 (six) hours as needed for wheezing or shortness of breath.   aspirin EC 81 MG tablet Take 81 mg by mouth daily. Swallow whole.   cholecalciferol 25 MCG (1000 UNIT) tablet Commonly known as: VITAMIN D3 Take 1,000 Units by mouth daily.   ezetimibe 10 MG tablet Commonly known as: ZETIA Take 1 tablet (10 mg total) by mouth daily.   metoprolol succinate 25 MG 24 hr tablet Commonly  known as: Toprol XL Take 1.5 tablets (37.5 mg total) by mouth daily.   multivitamin with minerals Tabs tablet Take 1 tablet by mouth daily.   naproxen 500 MG tablet Commonly known as: NAPROSYN Take 500 mg by mouth 2 (two) times daily.   nitroGLYCERIN 0.4 MG SL tablet Commonly known as: NITROSTAT Place 1 tablet (0.4 mg total) under the tongue every 5 (five) minutes as needed for chest pain.   sertraline 25 MG tablet Commonly known as: ZOLOFT Take 25 mg by mouth in the morning.   simvastatin 40 MG tablet Commonly known as: ZOCOR Take 40 mg by mouth in the morning.   YUMVS BEET ROOT-TART CHERRY PO Take by mouth.        Signature:  Chesley Mires, MD [place] Pager - [phone] 07/17/2022, 11:32 AM

## 2022-07-18 ENCOUNTER — Encounter: Payer: Self-pay | Admitting: Pulmonary Disease

## 2022-09-02 ENCOUNTER — Encounter (HOSPITAL_BASED_OUTPATIENT_CLINIC_OR_DEPARTMENT_OTHER): Payer: Self-pay | Admitting: Pulmonary Disease

## 2022-09-02 ENCOUNTER — Ambulatory Visit (INDEPENDENT_AMBULATORY_CARE_PROVIDER_SITE_OTHER): Payer: Medicare PPO | Admitting: Pulmonary Disease

## 2022-09-02 VITALS — BP 134/68 | HR 68 | Temp 97.7°F | Ht 72.0 in | Wt 256.6 lb

## 2022-09-02 DIAGNOSIS — G4733 Obstructive sleep apnea (adult) (pediatric): Secondary | ICD-10-CM | POA: Diagnosis not present

## 2022-09-02 NOTE — Progress Notes (Signed)
[facility]  Chief Complaint  Patient presents with   Follow-up    Follow up. Follow up on inspire.     Past Surgical History:  He  has a past surgical history that includes STENTS (09/05/2009); Colonoscopy (10/2004); Colonoscopy (N/A, 03/29/2013); Nasal sinus surgery; Skin cancer excision; Total shoulder arthroplasty (Right, 12/24/2017); Mouth surgery; Total knee arthroplasty (Right, 08/27/2020); LEFT HEART CATH AND CORONARY ANGIOGRAPHY (N/A, 02/06/2022); Drug induced endoscopy (Bilateral, 03/04/2022); and Implantation of hypoglossal nerve stimulator (Right, 04/16/2022).  Past Medical History:  Osteoarthritis, CAD s/p stent, HLD, HTN, Depression  Constitutional:  BP 134/68 (BP Location: Left Arm, Patient Position: Sitting, Cuff Size: Normal)   Pulse 68   Temp 97.7 F (36.5 C) (Oral)   Ht 6' (1.829 m)   Wt 256 lb 9.6 oz (116.4 kg)   SpO2 98%   BMI 34.80 kg/m   Brief Summary:  Omar Ruiz is a 72 y.o. male former smoker with obstructive sleep apnea.      Subjective:   Here with Inspire team.  He tried level 5 and 4, but was uncomfortable.  Did well with level 3.  Not having neck pain.  No issues with speech or swallowing.  Sleeping well.  Using device 8 hrs 3 min per night with 1.3 V.  Physical Exam:   Appearance - well kempt   ENMT - no sinus tenderness, no oral exudate, no LAN, Mallampati 4 airway, no stridor, mild TMJ click  Respiratory - equal breath sounds bilaterally, no wheezing or rales  CV - s1s2 regular rate and rhythm, no murmurs  Ext - no clubbing, no edema  Skin - no rashes  Psych - normal mood and affect   Pulmonary testing:  PFT 03/18/17 >> FEV1 3.38 (87%), FEV1% 81, TLC 7.19 (94%), DLCO 79%  Sleep Tests:  HST 01/14/22 >> AHI 16.4, SpO2 low 70%  Cardiac Tests:  Echo 09/13/21 >> EF 65 to 70%, mild LVH, severe RA dilation, aortic root 37 mm  Social History:  He  reports that he quit smoking about 41  years ago. His smoking use included cigarettes. He has never used smokeless tobacco. He reports current alcohol use of about 12.0 standard drinks of alcohol per week. He reports that he does not use drugs.  Family History:  His family history includes Asthma in an other family member; CAD in his brother; Diabetes in his mother and another family member; Heart disease in his father; Lung disease in an other family member.     Assessment/Plan:   Obstructive sleep apnea. - intolerant of CPAP - Inspire device implanted 04/16/22 - Device activated 06/24/22 - will arrange for in lab Inspire titration study  Coronary artery disease. - followed by Dr. Riley Lam with cardiology  Obesity. - discussed how weight can impact sleep and risk for sleep disordered breathing - discussed options to assist with weight loss: combination of diet modification, cardiovascular and strength training exercises  Time Spent Involved in Patient Care on Day of Examination:  25 minutes  Follow up:   Patient Instructions  Will arrange for an in lab Inspire titration sleep study Will call to arrange for follow up after sleep study reviewed   Medication List:   Allergies as of 09/02/2022   No Known Allergies      Medication List        Accurate as of September 02, 2022 10:55 AM. If you have any questions, ask your nurse or doctor.  acetaminophen 500 MG tablet Commonly known as: TYLENOL Take 1,000 mg by mouth daily as needed for moderate pain or mild pain.   albuterol 108 (90 Base) MCG/ACT inhaler Commonly known as: VENTOLIN HFA Inhale 2 puffs into the lungs every 6 (six) hours as needed for wheezing or shortness of breath.   aspirin EC 81 MG tablet Take 81 mg by mouth daily. Swallow whole.   cholecalciferol 25 MCG (1000 UNIT) tablet Commonly known as: VITAMIN D3 Take 1,000 Units by mouth daily.   ezetimibe 10 MG tablet Commonly known as: ZETIA Take 1 tablet (10 mg total)  by mouth daily.   metoprolol succinate 25 MG 24 hr tablet Commonly known as: Toprol XL Take 1.5 tablets (37.5 mg total) by mouth daily.   multivitamin with minerals Tabs tablet Take 1 tablet by mouth daily.   naproxen 500 MG tablet Commonly known as: NAPROSYN Take 500 mg by mouth 2 (two) times daily.   nitroGLYCERIN 0.4 MG SL tablet Commonly known as: NITROSTAT Place 1 tablet (0.4 mg total) under the tongue every 5 (five) minutes as needed for chest pain.   sertraline 25 MG tablet Commonly known as: ZOLOFT Take 25 mg by mouth in the morning.   simvastatin 40 MG tablet Commonly known as: ZOCOR Take 40 mg by mouth in the morning.   YUMVS BEET ROOT-TART CHERRY PO Take by mouth.        Signature:  Coralyn Helling, MD [facility] Pager - [phone] 09/02/2022, 10:55 AM

## 2022-09-02 NOTE — Patient Instructions (Signed)
Will arrange for an in lab Inspire titration sleep study Will call to arrange for follow up after sleep study reviewed

## 2022-09-23 ENCOUNTER — Ambulatory Visit (HOSPITAL_BASED_OUTPATIENT_CLINIC_OR_DEPARTMENT_OTHER): Payer: Medicare PPO | Attending: Pulmonary Disease | Admitting: Pulmonary Disease

## 2022-09-23 DIAGNOSIS — G4733 Obstructive sleep apnea (adult) (pediatric): Secondary | ICD-10-CM | POA: Diagnosis not present

## 2022-09-24 ENCOUNTER — Telehealth: Payer: Self-pay | Admitting: Pulmonary Disease

## 2022-09-24 DIAGNOSIS — G4733 Obstructive sleep apnea (adult) (pediatric): Secondary | ICD-10-CM

## 2022-09-24 NOTE — Procedures (Signed)
Patient Name: Omar Ruiz, Omar Ruiz Date: 09/23/2022 Gender: Male D.O.B: 22-Feb-1951 Age (years): 73 Referring Provider: Coralyn Helling MD, ABSM Height (inches): 72 Interpreting Physician: Coralyn Helling MD, ABSM Weight (lbs): 258 RPSGT: Lowry Ram BMI: 35 MRN: [id number] Neck Size: 18.00  CLINICAL INFORMATION The patient is referred for a hypoglossal nerve stimulator titration study to treat sleep apnea.  Date of HST 01/14/22: AHI 16.4, SpO2 low 70%.  SLEEP STUDY TECHNIQUE As per the AASM Manual for the Scoring of Sleep and Associated Events v2.3 (April 2016) with a hypopnea requiring 4% desaturations.  The channels recorded and monitored were frontal, central and occipital EEG, electrooculogram (EOG), submentalis EMG (chin), nasal and oral airflow, thoracic and abdominal wall motion, anterior tibialis EMG, snore microphone, electrocardiogram, and pulse oximetry. Continuous positive airway pressure (CPAP) was initiated at the beginning of the study and titrated to treat sleep-disordered breathing.  MEDICATIONS Medications self-administered by patient taken the night of the study : NAPROXEN  TECHNICIAN COMMENTS Comments added by technician: Patient had difficulty initiating sleep. Comments added by scorer: N/A  RESPIRATORY PARAMETERS He was started on 1.1 V and increased to 1.6 V.  He had improvement in respiratory events at 1.6 V when he was sleeping in the non-supine position, but continued to have respiratory events when in the supine position.  SLEEP ARCHITECTURE The study was initiated at 10:13:30 PM and ended at 5:11:05 AM.  Sleep onset time was 2.3 minutes and the sleep efficiency was 80.1%. The total sleep time was 334.5 minutes.  The patient spent 13.9% of the night in stage N1 sleep, 72.6% in stage N2 sleep, 0.0% in stage N3 and 13.5% in REM.Stage REM latency was 183.5 minutes  Wake after sleep onset was 80.8. Alpha intrusion was absent. Supine sleep was  16.89%.  CARDIAC DATA The 2 lead EKG demonstrated sinus rhythm. The mean heart rate was 71.1 beats per minute. Other EKG findings include: None.  LEG MOVEMENT DATA The total Periodic Limb Movements of Sleep (PLMS) were 0. The PLMS index was 0.0. A PLMS index of <15 is considered normal in adults.  IMPRESSIONS - Optimal setting during this study for the hypoglossal nerve stimulator device is 1.6 V while sleeping in the non-supine position.  DIAGNOSIS - Obstructive Sleep Apnea (G47.33)  RECOMMENDATIONS - Set hypoglossal nerve stimulator at 1.6 V with positional therapy. - Avoid alcohol, sedatives and other CNS depressants that may worsen sleep apnea and disrupt normal sleep architecture. - Sleep hygiene should be reviewed to assess factors that may improve sleep quality. - Weight management and regular exercise should be initiated or continued.  [Electronically signed] 09/24/2022 01:06 PM  Coralyn Helling MD, ABSM Diplomate, American Board of Sleep Medicine NPI: [id number]

## 2022-09-24 NOTE — Telephone Encounter (Signed)
Inspire titration 09/23/22 >> 1.6 V and non-supine position.   Please let him know that he did well during the sleep study with his Inspire device at 1.6 V and while sleeping on his side.

## 2022-10-02 ENCOUNTER — Encounter: Payer: Self-pay | Admitting: Pulmonary Disease

## 2022-10-07 ENCOUNTER — Ambulatory Visit (HOSPITAL_BASED_OUTPATIENT_CLINIC_OR_DEPARTMENT_OTHER): Payer: Medicare PPO | Admitting: Pulmonary Disease

## 2022-10-10 NOTE — Telephone Encounter (Signed)
Called and spoke w/ pt he verbalized understanding. NFN att. 

## 2022-10-17 ENCOUNTER — Encounter (HOSPITAL_COMMUNITY): Payer: Self-pay | Admitting: Radiology

## 2022-10-17 ENCOUNTER — Ambulatory Visit (HOSPITAL_COMMUNITY)
Admission: RE | Admit: 2022-10-17 | Discharge: 2022-10-17 | Disposition: A | Discharge status: Home / Self Care | Payer: Medicare PPO | Source: Ambulatory Visit | Attending: Physician Assistant | Admitting: Physician Assistant

## 2022-10-17 DIAGNOSIS — I77819 Aortic ectasia, unspecified site: Secondary | ICD-10-CM | POA: Diagnosis present

## 2022-10-17 MED ORDER — IOHEXOL 350 MG/ML SOLN
75.0000 mL | Freq: Once | INTRAVENOUS | Status: AC | PRN
  Administered 2022-10-17: 75 mL via INTRAVENOUS

## 2022-10-28 ENCOUNTER — Telehealth: Payer: Self-pay | Admitting: *Deleted

## 2022-10-28 ENCOUNTER — Ambulatory Visit (INDEPENDENT_AMBULATORY_CARE_PROVIDER_SITE_OTHER): Payer: Medicare PPO | Admitting: Adult Health

## 2022-10-28 ENCOUNTER — Encounter: Payer: Self-pay | Admitting: Adult Health

## 2022-10-28 VITALS — BP 140/80 | HR 64 | Ht 72.0 in | Wt 259.6 lb

## 2022-10-28 DIAGNOSIS — G4733 Obstructive sleep apnea (adult) (pediatric): Secondary | ICD-10-CM

## 2022-10-28 NOTE — Progress Notes (Signed)
Reviewed and agree with assessment/plan.   Nikea Settle, MD [facility] 10/28/2022, 3:13 PM Pager:  [phone]  

## 2022-10-28 NOTE — Telephone Encounter (Signed)
Detar [place], this patient will need a 6 month f/u with Dr. Craige Cotta.  He is an inspire patient.  Thank you.

## 2022-10-28 NOTE — Progress Notes (Signed)
@Patient  ID: Omar Ruiz, male    DOB: [birth year], 72 y.o.   MRN: [id number]  Chief Complaint  Patient presents with   Follow-up    Referring provider: Roe Rutherford, NP  HPI: 72 year old male followed for obstructive sleep apnea intolerant to CPAP status post inspire implant  TEST/EVENTS :  PFT 03/18/17 >> FEV1 3.38 (87%), FEV1% 81, TLC 7.19 (94%), DLCO 79%   Sleep Tests:  HST 01/14/22 >> AHI 16.4, SpO2 low 70% Inspire implantation April 16, 2022 Inspire device activation June 24, 2022 Inspire titration 09/23/22 >> 1.6 V and non-supine position.    Cardiac Tests:  Echo 09/13/21 >> EF 65 to 70%, mild LVH, severe RA dilation, aortic root 37 mm  10/28/2022 Follow up ; OSA  Patient returns for a 61-month follow-up.  Patient has underlying sleep apnea that was intolerant to CPAP.  He underwent inspire implantation on April 16, 2022.  Device activation was June 24, 2022.  Last visit patient was doing well using his inspire device each night at level 1.3 V.  Patient was set up for an inspire titration study that was done on Sep 23, 2022.  Showed optimal control at 1.6 V with lateral position.  Since last visit patient is doing well. Uses Inspire each night , feels rested and feels he benefits from [place].  Changed pulse width 90/33 to 90/40 due to buzzing sound on higher level . Resolved with changes.  Compliance check today shows over last 30 days , 97% usage , average usage at 8 hr .  Denies any dysphagia or abnormal tongue movement.  Range set 1.1 to 1.6v. Changed to 1.4 v  today . Tolerated well.    No Known Allergies  Immunization History  Administered Date(s) Administered   Fluad Quad(high Dose 65+) 02/11/2022   Influenza Inj Mdck Quad Pf 01/21/2021   Influenza,inj,Quad PF,6+ Mos 02/22/2015   Influenza,inj,quad, With Preservative 03/13/2019   Influenza-Unspecified 02/22/2015, 03/13/2019   Moderna Sars-Covid-2 Vaccination 09/14/2019, 10/05/2019    PNEUMOCOCCAL CONJUGATE-20 12/10/2020   Unspecified SARS-COV-2 Vaccination 09/14/2019    Past Medical History:  Diagnosis Date   Arthritis    CAD (coronary artery disease)    a. s/p DES to LAD in 2011 with low-risk NST in 02/2017   Cancer (HCC)    skin cancer on hand, cancer removed   Chest pain 07/2004   2D Echo EF>55%   Hyperlipemia    Hypertension 10/2004   stress test EF 57%   Obesity    Palpitation    PSVT (paroxysmal supraventricular tachycardia)    Sleep apnea     Tobacco History: Social History   Tobacco Use  Smoking Status Former   Types: Cigarettes   Quit date: 05/12/1981   Years since quitting: 41.4  Smokeless Tobacco Never   Counseling given: Not Answered   Outpatient Medications Prior to Visit  Medication Sig Dispense Refill   acetaminophen (TYLENOL) 500 MG tablet Take 1,000 mg by mouth daily as needed for moderate pain or mild pain.     albuterol (VENTOLIN HFA) 108 (90 Base) MCG/ACT inhaler Inhale 2 puffs into the lungs every 6 (six) hours as needed for wheezing or shortness of breath.     aspirin EC 81 MG tablet Take 81 mg by mouth daily. Swallow whole.     cholecalciferol (VITAMIN D3) 25 MCG (1000 UNIT) tablet Take 1,000 Units by mouth daily.     ezetimibe (ZETIA) 10 MG tablet Take 1 tablet (10 mg total) by mouth daily.  90 tablet 3   metoprolol succinate (TOPROL XL) 25 MG 24 hr tablet Take 1.5 tablets (37.5 mg total) by mouth daily. 135 tablet 3   Misc Natural Products (YUMVS BEET ROOT-TART CHERRY PO) Take by mouth.     Multiple Vitamin (MULTIVITAMIN WITH MINERALS) TABS tablet Take 1 tablet by mouth daily.     naproxen (NAPROSYN) 500 MG tablet Take 500 mg by mouth 2 (two) times daily.     nitroGLYCERIN (NITROSTAT) 0.4 MG SL tablet Place 1 tablet (0.4 mg total) under the tongue every 5 (five) minutes as needed for chest pain. 25 tablet 3   sertraline (ZOLOFT) 25 MG tablet Take 25 mg by mouth in the morning.     simvastatin (ZOCOR) 40 MG tablet Take 40 mg by  mouth in the morning.     No facility-administered medications prior to visit.     Review of Systems:   Constitutional:   No  weight loss, night sweats,  Fevers, chills, fatigue, or  lassitude.  HEENT:   No headaches,  Difficulty swallowing,  Tooth/dental problems, or  Sore throat,                No sneezing, itching, ear ache, nasal congestion, post nasal drip,   CV:  No chest pain,  Orthopnea, PND, swelling in lower extremities, anasarca, dizziness, palpitations, syncope.   GI  No heartburn, indigestion, abdominal pain, nausea, vomiting, diarrhea, change in bowel habits, loss of appetite, bloody stools.   Resp: No shortness of breath with exertion or at rest.  No excess mucus, no productive cough,  No non-productive cough,  No coughing up of blood.  No change in color of mucus.  No wheezing.  No chest wall deformity  Skin: no rash or lesions.  GU: no dysuria, change in color of urine, no urgency or frequency.  No flank pain, no hematuria   MS:  No joint pain or swelling.  No decreased range of motion.  No back pain.    Physical Exam  BP (!) 140/80 (BP Location: Left Arm, Patient Position: Sitting, Cuff Size: Large)   Pulse 64   Ht 6' (1.829 m)   Wt 259 lb 9.6 oz (117.8 kg)   SpO2 95%   BMI 35.21 kg/m   GEN: A/Ox3; pleasant , NAD, well nourished    HEENT:  [place]/AT,  , NOSE-clear, THROAT-clear, no lesions, no postnasal drip or exudate noted. Normal tongue movement .   NECK:  Supple w/ fair ROM; no JVD; normal carotid impulses w/o bruits; no thyromegaly or nodules palpated; no lymphadenopathy.    RESP  Clear  P & A; w/o, wheezes/ rales/ or rhonchi. no accessory muscle use, no dullness to percussion  CARD:  RRR, no m/r/g, no peripheral edema, pulses intact, no cyanosis or clubbing.  GI:   Soft & nt; nml bowel sounds; no organomegaly or masses detected.   Musco: Warm bil, no deformities or joint swelling noted.   Neuro: alert, no focal deficits noted.    Skin: Warm, no  lesions or rashes    Lab Results:  CBC     BNP No results found for: "BNP"  ProBNP No results found for: "PROBNP"  Imaging:        Latest Ref Rng & Units 03/18/2017    1:41 PM  PFT Results  FVC-Pre L 4.17   FVC-Predicted Pre % 81   Pre FEV1/FVC % % 81   FEV1-Pre L 3.38   FEV1-Predicted Pre % 87  DLCO uncorrected ml/min/mmHg 28.86   DLCO UNC% % 79   DLCO corrected ml/min/mmHg 28.86   DLCO COR %Predicted % 79   DLVA Predicted % 93   TLC L 7.19   TLC % Predicted % 94   RV % Predicted % 116     No results found for: "NITRICOXIDE"      Assessment & Plan:   Obstructive sleep apnea Obstructive sleep apnea CPAP intolerant.  Status post inspire implant.  Patient is doing very well.  Uses it each night compliance report shows excellent compliance.  Recent titration study showed optimal control at 1.6 V on lateral position.  Patient's pulse width was changed today due to buzzing sound.  This resolved with pulse width at 90/40.  Voltage was increased to 1.4 V today.  Patient is to titrate up weekly to a maximum of 1.6 V as tolerated.  Plan  Patient Instructions  Continue on Inspire each night. Can titrate up as tolerated each week for next 2 levels if tolerated.  Follow up with Dr. Craige Cotta  6 months and As needed        Rubye Oaks, NP 10/28/2022

## 2022-10-28 NOTE — Assessment & Plan Note (Signed)
Obstructive sleep apnea CPAP intolerant.  Status post inspire implant.  Patient is doing very well.  Uses it each night compliance report shows excellent compliance.  Recent titration study showed optimal control at 1.6 V on lateral position.  Patient's pulse width was changed today due to buzzing sound.  This resolved with pulse width at 90/40.  Voltage was increased to 1.4 V today.  Patient is to titrate up weekly to a maximum of 1.6 V as tolerated.  Plan  Patient Instructions  Continue on Inspire each night. Can titrate up as tolerated each week for next 2 levels if tolerated.  Follow up with Dr. Craige Cotta  6 months and As needed

## 2022-10-28 NOTE — Patient Instructions (Addendum)
Continue on Inspire each night. Can titrate up as tolerated each week for next 2 levels if tolerated.  Follow up with Dr. Craige Cotta  6 months and As needed

## 2022-11-07 NOTE — Telephone Encounter (Signed)
Recall has been placed for [facility] for Dr. Vassie Loll as VS will no longer with [facility]. Nothing further needed at this time.

## 2022-11-27 ENCOUNTER — Telehealth: Payer: Self-pay | Admitting: Internal Medicine

## 2022-11-27 MED ORDER — NITROGLYCERIN 0.4 MG SL SUBL: 0.4000 mg | SUBLINGUAL_TABLET | SUBLINGUAL | 3 refills | Status: AC | PRN

## 2022-11-27 NOTE — Telephone Encounter (Signed)
*  STAT* If patient is at the pharmacy, call can be transferred to refill team.   1. Which medications need to be refilled? (please list name of each medication and dose if known)   nitroGLYCERIN (NITROSTAT) 0.4 MG SL tablet    2. Which pharmacy/location (including street and city if local pharmacy) is medication to be sent to?[id number] - [place], [place] - [address]   3. Do they need a 30 day or 90 day supply? 30 day

## 2022-11-27 NOTE — Telephone Encounter (Signed)
Refilled as requested, has apt with dr Jenene Slicker

## 2023-02-03 ENCOUNTER — Ambulatory Visit: Payer: Medicare PPO | Admitting: Internal Medicine

## 2023-03-07 ENCOUNTER — Other Ambulatory Visit: Payer: Self-pay | Admitting: Internal Medicine

## 2023-03-20 ENCOUNTER — Ambulatory Visit: Payer: Medicare PPO | Attending: Internal Medicine | Admitting: Internal Medicine

## 2023-03-20 ENCOUNTER — Encounter: Payer: Self-pay | Admitting: Internal Medicine

## 2023-03-20 VITALS — BP 134/84 | HR 70 | Ht 73.0 in | Wt 270.0 lb

## 2023-03-20 DIAGNOSIS — I77810 Thoracic aortic ectasia: Secondary | ICD-10-CM | POA: Diagnosis not present

## 2023-03-20 DIAGNOSIS — I2510 Atherosclerotic heart disease of native coronary artery without angina pectoris: Secondary | ICD-10-CM | POA: Diagnosis not present

## 2023-03-20 NOTE — Patient Instructions (Signed)

## 2023-03-20 NOTE — Progress Notes (Signed)
Cardiology Office Note  Date: 03/20/2023   ID: KELCY STERCHI, DOB [birth year], MRN [id number]  PCP:  Roe Rutherford, NP  Cardiologist:  Marjo Bicker, MD Electrophysiologist:  None   History of Present Illness: Omar Ruiz is a 72 y.o. male known to have CAD s/p LAD PCI in 2011, aortic dilatation (4.2 cm of aortic root and 4 cm ascending aorta in 12/2021), OSA s/p inspire, possible TIA is here for follow-up visit.  Overall doing great, has chest pains at rest but no chest pains with exertion.  No DOE (although he did complain of some SOB with regular activities), orthopnea, PND, dizziness, presyncope, syncope, palpitations and leg swelling.  Past Medical History:  Diagnosis Date   Arthritis    CAD (coronary artery disease)    a. s/p DES to LAD in 2011 with low-risk NST in 02/2017   Cancer [facility])    skin cancer on hand, cancer removed   Chest pain 07/2004   2D Echo EF>55%   Hyperlipemia    Hypertension 10/2004   stress test EF 57%   Obesity    Palpitation    PSVT (paroxysmal supraventricular tachycardia) (HCC)    Sleep apnea     Past Surgical History:  Procedure Laterality Date   COLONOSCOPY  10/2004   COLONOSCOPY N/A 03/29/2013   Procedure: COLONOSCOPY;  Surgeon: Dalia Heading, MD;  Location: [facility];  Service: Gastroenterology;  Laterality: N/A;   DRUG INDUCED ENDOSCOPY Bilateral 03/04/2022   Procedure: DRUG INDUCED ENDOSCOPY;  Surgeon: Christia Reading, MD;  Location: [facility];  Service: ENT;  Laterality: Bilateral;   IMPLANTATION OF HYPOGLOSSAL NERVE STIMULATOR Right 04/16/2022   Procedure: IMPLANTATION OF HYPOGLOSSAL NERVE STIMULATOR;  Surgeon: Christia Reading, MD;  Location: [facility] OR;  Service: ENT;  Laterality: Right;   LEFT HEART CATH AND CORONARY ANGIOGRAPHY N/A 02/06/2022   Procedure: LEFT HEART CATH AND CORONARY ANGIOGRAPHY;  Surgeon: Lyn Records, MD;  Location: [facility];  Service: Cardiovascular;  Laterality: N/A;   MOUTH  SURGERY     NASAL SINUS SURGERY     SKIN CANCER EXCISION     right hand    STENTS  09/05/2009   3.0x68mm promus drug eluting stent for a 90% mid LAD artery stenosis   TOTAL KNEE ARTHROPLASTY Right 08/27/2020   Procedure: TOTAL KNEE ARTHROPLASTY;  Surgeon: Ollen Gross, MD;  Location: [facility];  Service: Orthopedics;  Laterality: Right;    TOTAL SHOULDER ARTHROPLASTY Right 12/24/2017   Procedure: RIGHT TOTAL SHOULDER ARTHROPLASTY;  Surgeon: Jones Broom, MD;  Location: [facility] OR;  Service: Orthopedics;  Laterality: Right;    Current Outpatient Medications  Medication Sig Dispense Refill   acetaminophen (TYLENOL) 500 MG tablet Take 1,000 mg by mouth daily as needed for moderate pain or mild pain.     albuterol (VENTOLIN HFA) 108 (90 Base) MCG/ACT inhaler Inhale 2 puffs into the lungs every 6 (six) hours as needed for wheezing or shortness of breath.     aspirin EC 81 MG tablet Take 81 mg by mouth daily. Swallow whole.     cholecalciferol (VITAMIN D3) 25 MCG (1000 UNIT) tablet Take 1,000 Units by mouth daily.     ezetimibe (ZETIA) 10 MG tablet TAKE 1 TABLET(10 MG) BY MOUTH DAILY 90 tablet 3   metoprolol succinate (TOPROL XL) 25 MG 24 hr tablet Take 1.5 tablets (37.5 mg total) by mouth daily. 135 tablet 3   Misc Natural Products (YUMVS BEET ROOT-TART CHERRY PO)  Take by mouth.     Multiple Vitamin (MULTIVITAMIN WITH MINERALS) TABS tablet Take 1 tablet by mouth daily.     naproxen (NAPROSYN) 500 MG tablet Take 500 mg by mouth 2 (two) times daily.     nitroGLYCERIN (NITROSTAT) 0.4 MG SL tablet Place 1 tablet (0.4 mg total) under the tongue every 5 (five) minutes as needed for chest pain. 25 tablet 3   sertraline (ZOLOFT) 25 MG tablet Take 25 mg by mouth in the morning.     simvastatin (ZOCOR) 40 MG tablet Take 40 mg by mouth in the morning.     No current facility-administered medications for this visit.   Allergies:  Patient has no known allergies.   Social History: The patient  reports  that he quit smoking about 41 years ago. His smoking use included cigarettes. He has never used smokeless tobacco. He reports current alcohol use of about 12.0 standard drinks of alcohol per week. He reports that he does not use drugs.   Family History: The patient's family history includes Asthma in an other family member; CAD in his brother; Diabetes in his mother and another family member; Heart disease in his father; Lung disease in an other family member.   ROS:  Please see the history of present illness. Otherwise, complete review of systems is positive for none.  All other systems are reviewed and negative.   Physical Exam: VS:  BP 134/84   Pulse 70   Ht 6\' 1"  (1.854 m)   Wt 270 lb (122.5 kg)   SpO2 94%   BMI 35.62 kg/m , BMI Body mass index is 35.62 kg/m.  Wt Readings from Last 3 Encounters:  03/20/23 270 lb (122.5 kg)  10/28/22 259 lb 9.6 oz (117.8 kg)  09/23/22 258 lb (117 kg)    General: Patient appears comfortable at rest. HEENT: Conjunctiva and lids normal, oropharynx clear with moist mucosa. Neck: Supple, no elevated JVP or carotid bruits, no thyromegaly. Lungs: Clear to auscultation, nonlabored breathing at rest. Cardiac: Regular rate and rhythm, no S3 or significant systolic murmur, no pericardial rub. Abdomen: Soft, nontender, no hepatomegaly, bowel sounds present, no guarding or rebound. Extremities: No pitting edema, distal pulses 2+. Skin: Warm and dry. Musculoskeletal: No kyphosis. Neuropsychiatric: Alert and oriented x3, affect grossly appropriate.  Recent Labwork: 04/09/2022: BUN 13; Creatinine, Ser 0.92; Hemoglobin 14.0; Platelets 215; Potassium 4.2; Sodium 141 06/04/2022: ALT 24     Component Value Date/Time   CHOL 130 06/04/2022 0842   TRIG 108 06/04/2022 0842   HDL 48 06/04/2022 0842   CHOLHDL 2.7 06/04/2022 0842   VLDL 22 06/04/2022 0842   LDLCALC 60 06/04/2022 0842     Assessment and Plan:  CAD s/p LAD PCI in 2011 Aorta dilatation (4.2 cm  aortic root and 4 cm ascending aorta) OSA s/p Inspiris  Possible TIA HTN, controlled HLD, at goal   -No angina, compensated.  Continue cardioprotective medications, aspirin 81 mg once daily, simvastatin 40 mg nightly and Zetia 10 mg once daily.  Goal LDL less than 70, currently at goal.  Continue metoprolol succinate 37.5 mg once daily.  SL NTG 0.4 mg as needed for chest pain.  ER precautions for chest pain provided.  For aortic dilatation surveillance, obtain CTA chest/aorta in 1 year.  Follows with pulm for OSA management, s/p inspire's device.  I spent a total duration 30 minutes reviewing the prior notes, EKG, labs, face-to-face discussion and counseling of his medical condition, pathophysiology, evaluation, management, reviewing medications, documenting  the findings in the note.  Medication Adjustments/Labs and Tests Ordered: Current medicines are reviewed at length with the patient today.  Concerns regarding medicines are outlined above.    Disposition:  Follow up 1 year  Signed, Dalana Pfahler Verne Spurr, MD, 03/20/2023 1:46 PM     [facility] [address] [place], [place] [address]

## 2023-04-15 ENCOUNTER — Other Ambulatory Visit: Payer: Self-pay | Admitting: Internal Medicine

## 2023-05-26 ENCOUNTER — Encounter (HOSPITAL_BASED_OUTPATIENT_CLINIC_OR_DEPARTMENT_OTHER): Payer: Self-pay | Admitting: Pulmonary Disease

## 2023-05-26 ENCOUNTER — Ambulatory Visit (HOSPITAL_BASED_OUTPATIENT_CLINIC_OR_DEPARTMENT_OTHER): Payer: Medicare PPO | Admitting: Pulmonary Disease

## 2023-05-26 VITALS — BP 132/72 | HR 72 | Resp 16 | Ht 73.0 in | Wt 275.3 lb

## 2023-05-26 DIAGNOSIS — G4733 Obstructive sleep apnea (adult) (pediatric): Secondary | ICD-10-CM

## 2023-05-26 NOTE — Progress Notes (Signed)
   Subjective:    Patient ID: Omar Ruiz, male    DOB: 19-Sep-1950, 73 y.o.   MRN: [id number]  HPI  73 year old male followed for obstructive sleep apnea intolerant to CPAP status post inspire implant  10/2022 OV  >> Changed pulse width 90/33 to 90/40 due to buzzing sound on higher level   Range set 1.1 to 1.6v. Changed to 1.4 v  >> increase to goal 1.6 V Duration 8 hours, pause 15 minutes, start delay 30 minutes  76-month follow-up with visit. He complains of tongue discomfort when he increased to 1.6 V, he is back down to 1.5V .  This is still causing discomfort and he is using pause button quite a few times during the night. Review of his download shows acceptable usage but frequent pauses  Incoming amplitude was 1.5 V.  I reviewed his sleep study he had residual events of 35/hour with predominant hypopneas at 1.6 V and residual AHI of 22/hour on 1.3 V.  He was started between 1.1 and 1.6 V   TEST/EVENTS :  PFT 03/18/17 >> FEV1 3.38 (87%), FEV1% 81, TLC 7.19 (94%), DLCO 79%   Sleep Tests:  HST 01/14/22 >> AHI 16.4, SpO2 low 70% Inspire implantation April 16, 2022 Inspire device activation June 24, 2022 Inspire titration 09/23/22 >> 1.6 V and non-supine position.    Cardiac Tests:  Echo 09/13/21 >> EF 65 to 70%, mild LVH, severe RA dilation, aortic root 37 mm  Review of Systems neg for any significant sore throat, dysphagia, itching, sneezing, nasal congestion or excess/ purulent secretions, fever, chills, sweats, unintended wt loss, pleuritic or exertional cp, hempoptysis, orthopnea pnd or change in chronic leg swelling. Also denies presyncope, palpitations, heartburn, abdominal pain, nausea, vomiting, diarrhea or change in bowel or urinary habits, dysuria,hematuria, rash, arthralgias, visual complaints, headache, numbness weakness or ataxia.     Objective:   Physical Exam  Gen. Pleasant, obese, in no distress ENT - no lesions, no post nasal drip Neck: No JVD, no  thyromegaly, no carotid bruits Lungs: no use of accessory muscles, no dullness to percussion, decreased without rales or rhonchi  Cardiovascular: Rhythm regular, heart sounds  normal, no murmurs or gallops, no peripheral edema Musculoskeletal: No deformities, no cyanosis or clubbing , no tremors       Assessment & Plan:     OSA -tongue protrusion was examined today.  He had comfortable tongue protrusion at 0.8 V and certainly was comfortable at all levels less than 1.3 V. He appears to be overstimulated .  Yesterday is ranged to 0.8 to 1.2 V.  He was started at 0.8 V as a level 1 and increase by 1 level every week We will arrange for titration sleep study in 1 month and starting him within ranges of 0.8 to 1.2 V hoping to find a therapeutic amplitude here -pulse width will remain at 90/40  If we are unable to find therapeutic amplitude then we may have to proceed with elective changes

## 2023-05-26 NOTE — Patient Instructions (Signed)
 We lowered your setting to 0.8V Increase by 1 level every Tuesday & report back in 4 weeks if one level feels the best Arrange for sleep study

## 2023-07-16 ENCOUNTER — Ambulatory Visit (HOSPITAL_BASED_OUTPATIENT_CLINIC_OR_DEPARTMENT_OTHER): Payer: Medicare PPO | Attending: Pulmonary Disease | Admitting: Pulmonary Disease

## 2023-07-16 DIAGNOSIS — G4733 Obstructive sleep apnea (adult) (pediatric): Secondary | ICD-10-CM | POA: Insufficient documentation

## 2023-07-25 ENCOUNTER — Encounter (HOSPITAL_BASED_OUTPATIENT_CLINIC_OR_DEPARTMENT_OTHER): Payer: Self-pay | Admitting: Pulmonary Disease

## 2023-07-25 DIAGNOSIS — G4733 Obstructive sleep apnea (adult) (pediatric): Secondary | ICD-10-CM

## 2023-07-25 NOTE — Procedures (Signed)
 Patient Name:  Omar Ruiz, Omar Ruiz Date:  07/16/2023 Referring Physician:  Cyril Mourning, MD  Indications for Polysomnography The patient is a 73 year-old Male who is 6' and weighs 273.0 lbs. His BMI equals 37.4.  A full night inspire treatment study was performed.  Polysomnogram Data A full night polysomnogram recorded the standard physiologic parameters including EEG, EOG, EMG, EKG, nasal and oral airflow.  Respiratory parameters of chest and abdominal movements were recorded with Respiratory Inductance Plethysmography belts.  Oxygen saturation was recorded by pulse oximetry.   Sleep Architecture The total recording time of the polysomnogram was 453.9 minutes.  The total sleep time was 318.0 minutes.  The patient spent 8.0% of total sleep time in Stage N1, 75.5% in Stage N2, 0.0% in Stages N3, and 16.5% in REM.  Sleep latency was 3.9 minutes.  REM latency was 206.0 minutes.  Sleep Efficiency was 70.1%.  Wake after Sleep Onset time was 131.5 minutes.  Inspire Summary The patient was on inspire therapy at levels ranging from 0.9* volts up to 1.6* volts.  The last level used in the study was 1.6* volts.  Respiratory Events The polysomnogram revealed a presence of 8 obstructive, 1 central, and - mixed apneas resulting in an Apnea index of 1.7 events per hour.  There were 300 hypopneas (>=3% desaturation and/or arousal) resulting in an Apnea\Hypopnea Index (AHI >=3% desaturation and/or arousal) of 58.3 events per hour.  There were 189 hypopneas (>=4% desaturation) resulting in an Apnea\Hypopnea Index (AHI >=4% desaturation) of 37.4 events per hour.  There were 4 Respiratory Effort Related Arousals resulting in a RERA index of 0.8 events per hour. The Respiratory Disturbance Index is 59.1 events per hour.  The snore index was 107.9 events per hour.  Mean oxygen saturation was 88.1%.  The lowest oxygen saturation during sleep was 73.0%.  Time spent <=88% oxygen saturation was 231.6 minutes  (51.7%).  Limb Activity There were - limb movements recorded.  Of this total, - were classified as PLMs.  Of the PLMs, - were associated with arousals.  The Limb Movement index was - per hour while the PLM index was - per hour.  Cardiac Summary The average pulse rate was 61.6 bpm.  The minimum pulse rate was 46.0 bpm while the maximum pulse rate was 81.0 bpm.  Cardiac rhythm was normal/abnormal.  Diagnosis: Obstructive sleep apnea s/p placement of hypoglossal nerve stimulator implant  Recommendations: No therapeutic amplitude was found on this study Device was studied from 0.9-1.6 V & hypopneas persisted at all levels Explore alternate electrode configurations on the device   This study was personally reviewed and electronically signed by: Cyril Mourning, MD Accredited Board Certified in Sleep Medicine

## 2023-07-27 NOTE — Progress Notes (Signed)
 Pt notified and he will be waiting on call from [facility] to schedule a F/U

## 2023-07-31 ENCOUNTER — Telehealth: Payer: Self-pay | Admitting: Pulmonary Disease

## 2023-07-31 NOTE — Telephone Encounter (Signed)
 Please call  patient for another sleep study. Not enough information was gathered the first time.

## 2023-08-18 ENCOUNTER — Encounter: Payer: Self-pay | Admitting: Pulmonary Disease

## 2023-08-21 ENCOUNTER — Encounter (HOSPITAL_BASED_OUTPATIENT_CLINIC_OR_DEPARTMENT_OTHER): Payer: Self-pay | Admitting: Pulmonary Disease

## 2023-08-21 ENCOUNTER — Ambulatory Visit (HOSPITAL_BASED_OUTPATIENT_CLINIC_OR_DEPARTMENT_OTHER): Payer: Medicare PPO | Admitting: Pulmonary Disease

## 2023-08-21 VITALS — BP 124/80 | HR 68 | Ht 72.0 in | Wt 273.6 lb

## 2023-08-21 DIAGNOSIS — G4733 Obstructive sleep apnea (adult) (pediatric): Secondary | ICD-10-CM

## 2023-08-21 DIAGNOSIS — Z9682 Presence of neurostimulator: Secondary | ICD-10-CM

## 2023-08-21 NOTE — Progress Notes (Signed)
 Subjective:    Patient ID: Omar Ruiz, male    DOB: 1950/07/01, 73 y.o.   MRN: [id number]  HPI  73 yo male followed for obstructive sleep apnea intolerant to CPAP status post inspire implant   10/2022 OV  >> Changed pulse width 90/33 to 90/40 due to buzzing sound on higher level   Range set 1.1 to 1.6v. Changed to 1.4 v  >> increase to goal 1.6 V Duration 8 hours, pause 15 minutes, start delay 30 minutes   07/2023  He complains of tongue discomfort when he increased to 1.6 V, he is back down to 1.5V .     Incoming amplitude was 1.5 V. On his sleep study he had residual events of 35/hour with predominant hypopneas at 1.6 V and residual AHI of 22/hour on 1.3 V.  He was started between 1.1 and 1.6 V  He had comfortable tongue protrusion at 0.8 V and  was comfortable at all levels less than 1.3 V.He appears to be overstimulated We changed range  to 0.8 to 1.2 V.  He was started at 0.8 V as a level 1 and increase by 1 level every week   1 month follow-up visit He could not tolerate high levels of stimulation during the sleep study. He is back down to 1.2 V He complains of tongue discomfort, dryness of mouth Download was reviewed, 70% usage more than 4 hours, 90% usage overall We reviewed sleep study    TEST/EVENTS :  PFT 03/18/17 >> FEV1 3.38 (87%), FEV1% 81, TLC 7.19 (94%), DLCO 79%   Sleep Tests:  HST 01/14/22 >> AHI 16.4, SpO2 low 70% Inspire implantation April 16, 2022 Inspire device activation June 24, 2022 Inspire titration 09/23/22 >> 1.6 V and non-supine position.  Inspire titration 07/2023 >> no therapeutic amplitude obtained   Cardiac Tests:  Echo 09/13/21 >> EF 65 to 70%, mild LVH, severe RA dilation, aortic root 37 mm     Review of Systems neg for any significant sore throat, dysphagia, itching, sneezing, nasal congestion or excess/ purulent secretions, fever, chills, sweats, unintended wt loss, pleuritic or exertional cp, hempoptysis, orthopnea pnd or  change in chronic leg swelling. Also denies presyncope, palpitations, heartburn, abdominal pain, nausea, vomiting, diarrhea or change in bowel or urinary habits, dysuria,hematuria, rash, arthralgias, visual complaints, headache, numbness weakness or ataxia.     Objective:   Physical Exam  Gen. Pleasant, obese, in no distress ENT - no lesions, no post nasal drip Neck: No JVD, no thyromegaly, no carotid bruits Lungs: no use of accessory muscles, no dullness to percussion, decreased without rales or rhonchi  Cardiovascular: Rhythm regular, heart sounds  normal, no murmurs or gallops, no peripheral edema Musculoskeletal: No deformities, no cyanosis or clubbing , no tremors       Assessment & Plan:   OSA Status post hypoglossal neurostimulator placement  -Using programmer we assessed tongue stimulation with different electrode configurations. He seemed to do well on electrode B configuration between 0.3-0.7V He was set at this range at 0.4 V/level 2  He will increase by 1 v every 2 weeks We will plan for home sleep test if he is able to achieve 0.6 V   Hypoglossal nerve stimulator was reassessed today.  Goal of the follow-up visit was to ensure good compliance, good subjective benefit, good tongue motion and good sense lead waveforms .incision sites appear good, tongue protrusion was examined.  Download was reviewed and usage appears to be up to 6 hours average  per night.

## 2023-08-21 NOTE — Patient Instructions (Signed)
 We changed to electrode B configuration Level 2 =0.4 V  Increase by 1 level every 2 weeks until tongue discomfort

## 2023-08-22 ENCOUNTER — Emergency Department (HOSPITAL_COMMUNITY)

## 2023-08-22 ENCOUNTER — Encounter (HOSPITAL_COMMUNITY): Payer: Self-pay | Admitting: *Deleted

## 2023-08-22 ENCOUNTER — Encounter: Payer: Self-pay | Admitting: Emergency Medicine

## 2023-08-22 ENCOUNTER — Other Ambulatory Visit: Payer: Self-pay

## 2023-08-22 ENCOUNTER — Emergency Department (HOSPITAL_COMMUNITY)
Admission: EM | Admit: 2023-08-22 | Discharge: 2023-08-22 | Disposition: A | Discharge status: Home / Self Care | Attending: Emergency Medicine | Admitting: Emergency Medicine

## 2023-08-22 ENCOUNTER — Ambulatory Visit: Admission: EM | Admit: 2023-08-22 | Discharge: 2023-08-22 | Disposition: A | Discharge status: Home / Self Care

## 2023-08-22 DIAGNOSIS — Z87891 Personal history of nicotine dependence: Secondary | ICD-10-CM | POA: Insufficient documentation

## 2023-08-22 DIAGNOSIS — I2510 Atherosclerotic heart disease of native coronary artery without angina pectoris: Secondary | ICD-10-CM | POA: Diagnosis not present

## 2023-08-22 DIAGNOSIS — Z79899 Other long term (current) drug therapy: Secondary | ICD-10-CM | POA: Insufficient documentation

## 2023-08-22 DIAGNOSIS — R1031 Right lower quadrant pain: Secondary | ICD-10-CM

## 2023-08-22 DIAGNOSIS — Z7982 Long term (current) use of aspirin: Secondary | ICD-10-CM | POA: Insufficient documentation

## 2023-08-22 DIAGNOSIS — K529 Noninfective gastroenteritis and colitis, unspecified: Secondary | ICD-10-CM | POA: Diagnosis not present

## 2023-08-22 DIAGNOSIS — R42 Dizziness and giddiness: Secondary | ICD-10-CM

## 2023-08-22 DIAGNOSIS — Z85828 Personal history of other malignant neoplasm of skin: Secondary | ICD-10-CM | POA: Diagnosis not present

## 2023-08-22 DIAGNOSIS — I10 Essential (primary) hypertension: Secondary | ICD-10-CM | POA: Diagnosis not present

## 2023-08-22 LAB — COMPREHENSIVE METABOLIC PANEL WITH GFR
ALT: 24 U/L (ref 0–44)
AST: 27 U/L (ref 15–41)
Albumin: 4.1 g/dL (ref 3.5–5.0)
Alkaline Phosphatase: 64 U/L (ref 38–126)
Anion gap: 10 (ref 5–15)
BUN: 12 mg/dL (ref 8–23)
CO2: 25 mmol/L (ref 22–32)
Calcium: 9.2 mg/dL (ref 8.9–10.3)
Chloride: 102 mmol/L (ref 98–111)
Creatinine, Ser: 1.09 mg/dL (ref 0.61–1.24)
GFR, Estimated: >60 mL/min (ref >60)
Glucose, Bld: 109 mg/dL — ABNORMAL HIGH (ref 70–99)
Potassium: 3.8 mmol/L (ref 3.5–5.1)
Sodium: 137 mmol/L (ref 135–145)
Total Bilirubin: 1.2 mg/dL (ref 0.0–1.2)
Total Protein: 7.6 g/dL (ref 6.5–8.1)

## 2023-08-22 LAB — CBC WITH DIFFERENTIAL/PLATELET
Abs Immature Granulocytes: 0.07 10*3/uL (ref 0.00–0.07)
Basophils Absolute: 0.1 10*3/uL (ref 0.0–0.1)
Basophils Relative: 0 %
Eosinophils Absolute: 0.1 10*3/uL (ref 0.0–0.5)
Eosinophils Relative: 1 %
HCT: 43.6 % (ref 39.0–52.0)
Hemoglobin: 14.8 g/dL (ref 13.0–17.0)
Immature Granulocytes: 1 %
Lymphocytes Relative: 19 %
Lymphs Abs: 2.6 10*3/uL (ref 0.7–4.0)
MCH: 30.8 pg (ref 26.0–34.0)
MCHC: 33.9 g/dL (ref 30.0–36.0)
MCV: 90.6 fL (ref 80.0–100.0)
Monocytes Absolute: 1.5 10*3/uL — ABNORMAL HIGH (ref 0.1–1.0)
Monocytes Relative: 11 %
Neutro Abs: 9.3 10*3/uL — ABNORMAL HIGH (ref 1.7–7.7)
Neutrophils Relative %: 68 %
Platelets: 210 10*3/uL (ref 150–400)
RBC: 4.81 MIL/uL (ref 4.22–5.81)
RDW: 14.2 % (ref 11.5–15.5)
WBC: 13.7 10*3/uL — ABNORMAL HIGH (ref 4.0–10.5)
nRBC: 0 % (ref 0.0–0.2)

## 2023-08-22 LAB — LIPASE, BLOOD: Lipase: 38 U/L (ref 11–51)

## 2023-08-22 MED ORDER — AMOXICILLIN-POT CLAVULANATE 875-125 MG PO TABS: 1.0000 | ORAL_TABLET | Freq: Two times a day (BID) | ORAL | 0 refills | Status: AC

## 2023-08-22 MED ORDER — AMOXICILLIN-POT CLAVULANATE 875-125 MG PO TABS
1.0000 | ORAL_TABLET | Freq: Once | ORAL | Status: AC
  Administered 2023-08-22: 1 via ORAL
  Filled 2023-08-22: qty 1

## 2023-08-22 MED ORDER — IOHEXOL 300 MG/ML  SOLN
100.0000 mL | Freq: Once | INTRAMUSCULAR | Status: AC | PRN
  Administered 2023-08-22: 100 mL via INTRAVENOUS

## 2023-08-22 NOTE — ED Provider Notes (Signed)
 [place] EMERGENCY DEPARTMENT AT [facility] Provider Note  CSN: [id number] Arrival date & time: 08/22/23 1020  Chief Complaint(s) Abdominal Pain  HPI Omar Ruiz is a 73 y.o. male history of coronary artery disease, hypertension, hyperlipidemia, presenting to the emergency department with abdominal pain.  Patient reports abdominal pain in the right lower quadrant.  Possibly had some slight nausea earlier but nothing currently.  No vomiting.  No diarrhea.  No fevers or chills.  No painful urination or back pain.  Denies similar episode previously.  He initially went to urgent care but was sent to the emergency department for further evaluation.   Past Medical History Past Medical History:  Diagnosis Date   Arthritis    CAD (coronary artery disease)    a. s/p DES to LAD in 2011 with low-risk NST in 02/2017   Cancer [facility])    skin cancer on hand, cancer removed   Chest pain 07/2004   2D Echo EF>55%   Hyperlipemia    Hypertension 10/2004   stress test EF 57%   Obesity    Palpitation    PSVT (paroxysmal supraventricular tachycardia) (HCC)    Sleep apnea    Patient Active Problem List   Diagnosis Date Noted   Aortic root dilatation (HCC) 03/20/2023   Aortic atherosclerosis (HCC) 02/28/2022   Myalgia due to statin 02/28/2022   Precordial pain    Sinus bradycardia 09/13/2021   Speech disturbance 09/13/2021   Dyspnea on exertion 09/13/2021   Vertigo 09/12/2021   BPH associated with nocturia 09/10/2021   Current severe episode of major depressive disorder without psychotic features without prior episode (HCC) 09/10/2021   Vitamin D deficiency 09/10/2021   CAD S/P percutaneous coronary angioplasty 07/16/2021   Obstructive sleep apnea 07/16/2021   Essential hypertension 07/16/2021   Mixed hyperlipidemia 07/16/2021   Alcohol abuse 12/10/2020   Obesity (BMI 30.0-34.9) 12/10/2020   Primary osteoarthritis of right knee 08/27/2020   Status post total shoulder  arthroplasty, right 12/24/2017   Chronic maxillary sinusitis 10/17/2016   CAD (coronary artery disease) 03/17/2013   Back pain 12/16/2012   Contusion of back 12/16/2012   Osteoarthritis, knee 01/22/2012   Acute torn meniscus 01/22/2012   Knee pain, right 01/22/2012   CONTUSION, LEFT KNEE 07/02/2009   Home Medication(s) Prior to Admission medications   Medication Sig Start Date End Date Taking? Authorizing Provider  amoxicillin-clavulanate (AUGMENTIN) 875-125 MG tablet Take 1 tablet by mouth every 12 (twelve) hours for 10 days. 08/22/23 09/01/23 Yes Mordecai Applebaum, MD  acetaminophen (TYLENOL) 500 MG tablet Take 1,000 mg by mouth daily as needed for moderate pain or mild pain.    [provider]  albuterol (VENTOLIN HFA) 108 (90 Base) MCG/ACT inhaler Inhale 2 puffs into the lungs every 6 (six) hours as needed for wheezing or shortness of breath. 01/14/22   [provider]  aspirin EC 81 MG tablet Take 81 mg by mouth daily. Swallow whole.    [provider]  cholecalciferol (VITAMIN D3) 25 MCG (1000 UNIT) tablet Take 1,000 Units by mouth daily.    [provider]  ezetimibe (ZETIA) 10 MG tablet TAKE 1 TABLET(10 MG) BY MOUTH DAILY 03/10/23   Chandrasekhar, Mahesh A, MD  metoprolol succinate (TOPROL-XL) 25 MG 24 hr tablet TAKE 1 AND 1/2 TABLETS(37.5 MG) BY MOUTH DAILY 04/17/23   Chandrasekhar, Caretha Chapel, MD  Misc Natural Products (YUMVS BEET ROOT-TART CHERRY PO) Take by mouth.    [provider]  Multiple Vitamin (MULTIVITAMIN  WITH MINERALS) TABS tablet Take 1 tablet by mouth daily.    [provider]  naproxen (NAPROSYN) 500 MG tablet Take 500 mg by mouth 2 (two) times daily. 11/29/21   [provider]  nitroGLYCERIN (NITROSTAT) 0.4 MG SL tablet Place 1 tablet (0.4 mg total) under the tongue every 5 (five) minutes as needed for chest pain. 11/27/22   Jann Melody, MD  sertraline (ZOLOFT) 25 MG tablet Take 25 mg by mouth in the  morning. 10/09/21   [provider]  simvastatin (ZOCOR) 40 MG tablet Take 40 mg by mouth in the morning. 10/23/21   [provider]                                                                                                                                    Past Surgical History Past Surgical History:  Procedure Laterality Date   COLONOSCOPY  10/2004   COLONOSCOPY N/A 03/29/2013   Procedure: COLONOSCOPY;  Surgeon: Beau Bound, MD;  [facility]: [facility];  Service: Gastroenterology;  Laterality: N/A;   DRUG INDUCED ENDOSCOPY Bilateral 03/04/2022   Procedure: DRUG INDUCED ENDOSCOPY;  Surgeon: Virgina Grills, MD;  Location: [facility];  Service: ENT;  Laterality: Bilateral;   IMPLANTATION OF HYPOGLOSSAL NERVE STIMULATOR Right 04/16/2022   Procedure: IMPLANTATION OF HYPOGLOSSAL NERVE STIMULATOR;  Surgeon: Virgina Grills, MD;  Location: [facility] OR;  Service: ENT;  Laterality: Right;   LEFT HEART CATH AND CORONARY ANGIOGRAPHY N/A 02/06/2022   Procedure: LEFT HEART CATH AND CORONARY ANGIOGRAPHY;  Surgeon: Arty Binning, MD;  Location: [facility];  Service: Cardiovascular;  Laterality: N/A;   MOUTH SURGERY     NASAL SINUS SURGERY     SKIN CANCER EXCISION     right hand    STENTS  09/05/2009   3.0x51mm promus drug eluting stent for a 90% mid LAD artery stenosis   TOTAL KNEE ARTHROPLASTY Right 08/27/2020   Procedure: TOTAL KNEE ARTHROPLASTY;  Surgeon: Liliane Rei, MD;  Location: [facility];  Service: Orthopedics;  Laterality: Right;    TOTAL SHOULDER ARTHROPLASTY Right 12/24/2017   Procedure: RIGHT TOTAL SHOULDER ARTHROPLASTY;  Surgeon: Sammye Cristal, MD;  Location: [facility] OR;  Service: Orthopedics;  Laterality: Right;   Family History Family History  Problem Relation Age of Onset   Diabetes Mother    Heart disease Father    Lung disease Other    Asthma Other    Diabetes Other    CAD Brother        3 brothers hx of cad    Social History Social  History   Tobacco Use   Smoking status: Former    Current packs/day: 0.00    Types: Cigarettes    Quit date: 05/12/1981    Years since quitting: 42.3   Smokeless tobacco: Never  Vaping Use   Vaping status: Never Used  Substance Use Topics  Alcohol use: Yes    Alcohol/week: 12.0 standard drinks of alcohol    Types: 12 Cans of beer per week   Drug use: No   Allergies Patient has no known allergies.  Review of Systems Review of Systems  All other systems reviewed and are negative.   Physical Exam Vital Signs  I have reviewed the triage vital signs BP 134/88 (BP Location: Left Arm)   Pulse 75   Temp 98.4 F (36.9 C) (Oral)   Resp 18   Ht 6' (1.829 m)   Wt 123.8 kg   SpO2 95%   BMI 37.03 kg/m  Physical Exam Vitals and nursing note reviewed.  Constitutional:      General: He is not in acute distress.    Appearance: Normal appearance.  HENT:     Mouth/Throat:     Mouth: Mucous membranes are moist.  Eyes:     Conjunctiva/sclera: Conjunctivae normal.  Cardiovascular:     Rate and Rhythm: Normal rate and regular rhythm.  Pulmonary:     Effort: Pulmonary effort is normal. No respiratory distress.     Breath sounds: Normal breath sounds.  Abdominal:     General: Abdomen is flat.     Palpations: Abdomen is soft.     Tenderness: There is abdominal tenderness in the right lower quadrant.  Musculoskeletal:     Right lower leg: No edema.     Left lower leg: No edema.  Skin:    General: Skin is warm and dry.     Capillary Refill: Capillary refill takes less than 2 seconds.  Neurological:     Mental Status: He is alert and oriented to person, place, and time. Mental status is at baseline.  Psychiatric:        Mood and Affect: Mood normal.        Behavior: Behavior normal.     ED Results and Treatments Labs (all labs ordered are listed, but only abnormal results are displayed) Labs Reviewed  COMPREHENSIVE METABOLIC PANEL WITH GFR - Abnormal; Notable for the  following components:      Result Value   Glucose, Bld 109 (*)    All other components within normal limits  CBC WITH DIFFERENTIAL/PLATELET - Abnormal; Notable for the following components:   WBC 13.7 (*)    Neutro Abs 9.3 (*)    Monocytes Absolute 1.5 (*)    All other components within normal limits  LIPASE, BLOOD                                                                                                                          Radiology CT ABDOMEN PELVIS W CONTRAST Result Date: 08/22/2023 CLINICAL DATA:  RLQ abdominal pain EXAM: CT ABDOMEN AND PELVIS WITH CONTRAST TECHNIQUE: Multidetector CT imaging of the abdomen and pelvis was performed using the standard protocol following bolus administration of intravenous contrast. RADIATION DOSE REDUCTION: This exam was performed according to the departmental dose-optimization program which includes automated exposure  control, adjustment of the mA and/or kV according to patient size and/or use of iterative reconstruction technique. CONTRAST:  OMNIPAQUE IOHEXOL 300 MG/ML  SOLN COMPARISON:  January 04, 2022 FINDINGS: Lower chest: No focal airspace consolidation or pleural effusion.Multifocal subsegmental atelectasis. Hepatobiliary: No mass. Unchanged subcentimeter right hepatic lobe hypodensity, too small to definitively characterize, but likely a small cyst or biliary hamartoma.No radiopaque stones or wall thickening of the gallbladder.No intrahepatic or extrahepatic biliary ductal dilation.The portal veins are patent. Pancreas: No mass or main ductal dilation.No peripancreatic inflammation or fluid collection. Spleen: Normal size. No mass. Adrenals/Urinary Tract: No adrenal masses. No renal mass. No nephrolithiasis or hydronephrosis. The urinary bladder is distended without focal abnormality. Stomach/Bowel: The stomach is decompressed without focal abnormality. No small bowel wall thickening or inflammation. No small bowel obstruction. Decompressed  appendix. Asymmetric, moderate wall thickening of the cecum measuring up to 13 mm thick (axial 44). Descending and sigmoid colonic diverticulosis. No changes of acute diverticulitis. Vascular/Lymphatic: No aortic aneurysm. Scattered aortoiliac atherosclerosis. No intraabdominal or pelvic lymphadenopathy. Reproductive: No prostatomegaly.No free pelvic fluid. Other: No pneumoperitoneum, ascites, or mesenteric inflammation. Musculoskeletal: No acute fracture or destructive lesion.Multilevel degenerative disc disease of the spine. Osteopenia. Mild joint space loss of both hips. IMPRESSION: 1. Asymmetric wall thickening of the cecum, measuring up to 13 mm thick (for example, axial 44). In the correct clinical context, this could reflect changes of a focal infectious or inflammatory colitis. A nonemergent colonoscopy should be considered to exclude underlying neoplasm. 2. Descending and sigmoid colonic diverticulosis. No changes of acute diverticulitis. Electronically Signed   By: Wallie Char M.D.   On: 08/22/2023 14:51    Pertinent labs & imaging results that were available during my care of the patient were reviewed by me and considered in my medical decision making (see MDM for details).  Medications Ordered in ED Medications  amoxicillin-clavulanate (AUGMENTIN) 875-125 MG per tablet 1 tablet (has no administration in time range)  iohexol (OMNIPAQUE) 300 MG/ML solution 100 mL (100 mLs Intravenous Contrast Given 08/22/23 1409)                                                                                                                                     Procedures Procedures  (including critical care time)  Medical Decision Making / ED Course   MDM:  73 year old presenting to the emergency department abdominal pain.  Patient focally tender in the right lower quadrant.  He denies any associated symptoms besides pain.  He is overall very well-appearing.  Differential includes appendicitis,  colitis, perforation, abscess, diverticulitis, nephrolithiasis, volvulus, other acute intra-abdominal process.  Given tenderness, obtain CT scan to further evaluate.  Clinical Course as of 08/22/23 1509  Sat Aug 22, 2023  1506 The scan shows colitis focally in the area of the cecum.  Appendix is normal.  Differential includes infectious or inflammatory per radiology.  Patient feels well, continues to decline any  pain medication.  Will treat for colitis.  Recommended close follow-up with gastroenterology. Will discharge patient to home. All questions answered. Patient comfortable with plan of discharge. Return precautions discussed with patient and specified on the after visit summary.  [WS]    Clinical Course User Index [WS] Mordecai Applebaum, MD     Additional history obtained: -Additional history obtained from spouse -External records from outside source obtained and reviewed including: Chart review including previous notes, labs, imaging, consultation notes including prior notes    Lab Tests: -I ordered, reviewed, and interpreted labs.   The pertinent results include:   Labs Reviewed  COMPREHENSIVE METABOLIC PANEL WITH GFR - Abnormal; Notable for the following components:      Result Value   Glucose, Bld 109 (*)    All other components within normal limits  CBC WITH DIFFERENTIAL/PLATELET - Abnormal; Notable for the following components:   WBC 13.7 (*)    Neutro Abs 9.3 (*)    Monocytes Absolute 1.5 (*)    All other components within normal limits  LIPASE, BLOOD    Notable for mild leukocytosis    Imaging Studies ordered: I ordered imaging studies including CT abdomen On my interpretation imaging demonstrates colitis  I independently visualized and interpreted imaging. I agree with the radiologist interpretation   Medicines ordered and prescription drug management: Meds ordered this encounter  Medications   iohexol (OMNIPAQUE) 300 MG/ML solution 100 mL    amoxicillin-clavulanate (AUGMENTIN) 875-125 MG per tablet 1 tablet   amoxicillin-clavulanate (AUGMENTIN) 875-125 MG tablet    Sig: Take 1 tablet by mouth every 12 (twelve) hours for 10 days.    Dispense:  20 tablet    Refill:  0    -I have reviewed the patients home medicines and have made adjustments as needed   Social Determinants of Health:  Diagnosis or treatment significantly limited by social determinants of health: obesity   Co morbidities that complicate the patient evaluation  Past Medical History:  Diagnosis Date   Arthritis    CAD (coronary artery disease)    a. s/p DES to LAD in 2011 with low-risk NST in 02/2017   Cancer [facility])    skin cancer on hand, cancer removed   Chest pain 07/2004   2D Echo EF>55%   Hyperlipemia    Hypertension 10/2004   stress test EF 57%   Obesity    Palpitation    PSVT (paroxysmal supraventricular tachycardia) (HCC)    Sleep apnea       Dispostion: Disposition decision including need for hospitalization was considered, and patient discharged from emergency department.    Final Clinical Impression(s) / ED Diagnoses Final diagnoses:  Colitis     This chart was dictated using voice recognition software.  Despite best efforts to proofread,  errors can occur which can change the documentation meaning.    Mordecai Applebaum, MD 08/22/23 [id number]

## 2023-08-22 NOTE — ED Triage Notes (Signed)
 Pt with abd pain since yesterday. Fever low grade per pt last night and took tylenol.  Denies any N/V/D.  Pt sent here for CT to r/o appendicitis.

## 2023-08-22 NOTE — ED Notes (Signed)
 Patient is being discharged from the Urgent Care and sent to the Emergency Department via private vehicle . Per NP, patient is in need of higher level of care due to sharp ABD pain. Patient is aware and verbalizes understanding of plan of care.  Vitals:   08/22/23 0933  BP: (!) 151/75  Pulse: 86  Resp: 18  Temp: 98.3 F (36.8 C)  SpO2: 92%

## 2023-08-22 NOTE — ED Provider Notes (Signed)
 [facility]    CSN: [id number] Arrival date & time: 08/22/23  0857      History   Chief Complaint No chief complaint on file.   HPI Omar Ruiz is a 73 y.o. male.   Omar Ruiz is a 73 y.o. male presenting for chief complaint of right lower quadrant abdominal pain that started yesterday morning. Pain is constant but sometimes spasms and gets worse. Pain is currently a 5/10 and is tolerable Gets to an 8 with movement Defecating did not improve or worsen pain  Pain to the abdomen is constant and sometimes radiates upward to the RUQ Heating pad helped abdominal pain No urinary symptoms Fever last night took tylenol and went away 99.7 No nausea or vomiting No recent cough or congestion No diarrhea He had 2 BM this morning, no blood in the stool No previous history of surgeries on the belly  Feels like the room is spinning if he makes a quick movement Does not feel similar to vertigo No recent falls or injuries to the head Ears are fine 2 years ago had head injury- had brain scan negative Started at the same time as the belly pain Worse with position changes Worse if he moves his head from side to side Headache generalized top of head 2/10, tolerable No vision changes     Past Medical History:  Diagnosis Date  . Arthritis   . CAD (coronary artery disease)    a. s/p DES to LAD in 2011 with low-risk NST in 02/2017  . Cancer (HCC)    skin cancer on hand, cancer removed  . Chest pain 07/2004   2D Echo EF>55%  . Hyperlipemia   . Hypertension 10/2004   stress test EF 57%  . Obesity   . Palpitation   . PSVT (paroxysmal supraventricular tachycardia) (HCC)   . Sleep apnea     Patient Active Problem List   Diagnosis Date Noted  . Aortic root dilatation (HCC) 03/20/2023  . Aortic atherosclerosis (HCC) 02/28/2022  . Myalgia due to statin 02/28/2022  . Precordial pain   . Sinus bradycardia 09/13/2021  . Speech disturbance 09/13/2021  .  Dyspnea on exertion 09/13/2021  . Vertigo 09/12/2021  . BPH associated with nocturia 09/10/2021  . Current severe episode of major depressive disorder without psychotic features without prior episode (HCC) 09/10/2021  . Vitamin D deficiency 09/10/2021  . CAD S/P percutaneous coronary angioplasty 07/16/2021  . Obstructive sleep apnea 07/16/2021  . Essential hypertension 07/16/2021  . Mixed hyperlipidemia 07/16/2021  . Alcohol abuse 12/10/2020  . Obesity (BMI 30.0-34.9) 12/10/2020  . Primary osteoarthritis of right knee 08/27/2020  . Status post total shoulder arthroplasty, right 12/24/2017  . Chronic maxillary sinusitis 10/17/2016  . CAD (coronary artery disease) 03/17/2013  . Back pain 12/16/2012  . Contusion of back 12/16/2012  . Osteoarthritis, knee 01/22/2012  . Acute torn meniscus 01/22/2012  . Knee pain, right 01/22/2012  . CONTUSION, LEFT KNEE 07/02/2009    Past Surgical History:  Procedure Laterality Date  . COLONOSCOPY  10/2004  . COLONOSCOPY N/A 03/29/2013   Procedure: COLONOSCOPY;  Surgeon: Beau Bound, MD;  [facility]: [facility];  Service: Gastroenterology;  Laterality: N/A;  . DRUG INDUCED ENDOSCOPY Bilateral 03/04/2022   Procedure: DRUG INDUCED ENDOSCOPY;  Surgeon: Virgina Grills, MD;  Location: [facility];  Service: ENT;  Laterality: Bilateral;  . IMPLANTATION OF HYPOGLOSSAL NERVE STIMULATOR Right 04/16/2022   Procedure: IMPLANTATION OF HYPOGLOSSAL NERVE STIMULATOR;  Surgeon: Tellis Feathers,  Arma Berkshire, MD;  Location: [facility];  Service: ENT;  Laterality: Right;  . LEFT HEART CATH AND CORONARY ANGIOGRAPHY N/A 02/06/2022   Procedure: LEFT HEART CATH AND CORONARY ANGIOGRAPHY;  Surgeon: Arty Binning, MD;  Location: [facility] INVASIVE CV LAB;  Service: Cardiovascular;  Laterality: N/A;  . MOUTH SURGERY    . NASAL SINUS SURGERY    . SKIN CANCER EXCISION     right hand   . STENTS  09/05/2009   3.0x25mm promus drug eluting stent for a 90% mid LAD artery stenosis  . TOTAL  KNEE ARTHROPLASTY Right 08/27/2020   Procedure: TOTAL KNEE ARTHROPLASTY;  Surgeon: Liliane Rei, MD;  Location: [facility];  Service: Orthopedics;  Laterality: Right;   . TOTAL SHOULDER ARTHROPLASTY Right 12/24/2017   Procedure: RIGHT TOTAL SHOULDER ARTHROPLASTY;  Surgeon: Sammye Cristal, MD;  Location: [facility];  Service: Orthopedics;  Laterality: Right;       Home Medications    Prior to Admission medications   Medication Sig Start Date End Date Taking? Authorizing Provider  acetaminophen (TYLENOL) 500 MG tablet Take 1,000 mg by mouth daily as needed for moderate pain or mild pain.    [provider]  albuterol (VENTOLIN HFA) 108 (90 Base) MCG/ACT inhaler Inhale 2 puffs into the lungs every 6 (six) hours as needed for wheezing or shortness of breath. 01/14/22   [provider]  aspirin EC 81 MG tablet Take 81 mg by mouth daily. Swallow whole.    [provider]  cholecalciferol (VITAMIN D3) 25 MCG (1000 UNIT) tablet Take 1,000 Units by mouth daily.    [provider]  ezetimibe (ZETIA) 10 MG tablet TAKE 1 TABLET(10 MG) BY MOUTH DAILY 03/10/23   Chandrasekhar, Mahesh A, MD  metoprolol succinate (TOPROL-XL) 25 MG 24 hr tablet TAKE 1 AND 1/2 TABLETS(37.5 MG) BY MOUTH DAILY 04/17/23   Chandrasekhar, Caretha Chapel, MD  Misc Natural Products (YUMVS BEET ROOT-TART CHERRY PO) Take by mouth.    [provider]  Multiple Vitamin (MULTIVITAMIN WITH MINERALS) TABS tablet Take 1 tablet by mouth daily.    [provider]  naproxen (NAPROSYN) 500 MG tablet Take 500 mg by mouth 2 (two) times daily. 11/29/21   [provider]  nitroGLYCERIN (NITROSTAT) 0.4 MG SL tablet Place 1 tablet (0.4 mg total) under the tongue every 5 (five) minutes as needed for chest pain. 11/27/22   Jann Melody, MD  sertraline (ZOLOFT) 25 MG tablet Take 25 mg by mouth in the morning. 10/09/21   [provider]  simvastatin (ZOCOR) 40 MG tablet Take 40 mg by  mouth in the morning. 10/23/21   [provider]    Family History Family History  Problem Relation Age of Onset  . Diabetes Mother   . Heart disease Father   . Lung disease Other   . Asthma Other   . Diabetes Other   . CAD Brother        3 brothers hx of cad    Social History Social History   Tobacco Use  . Smoking status: Former    Current packs/day: 0.00    Types: Cigarettes    Quit date: 05/12/1981    Years since quitting: 42.3  . Smokeless tobacco: Never  Vaping Use  . Vaping status: Never Used  Substance Use Topics  . Alcohol use: Yes    Alcohol/week: 12.0 standard drinks of alcohol    Types: 12 Cans of beer per week  . Drug use: No  Allergies   Patient has no known allergies.   Review of Systems Review of Systems   Physical Exam Triage Vital Signs ED Triage Vitals  Encounter Vitals Group     BP 08/22/23 0933 (!) 151/75     Systolic BP Percentile --      Diastolic BP Percentile --      Pulse Rate 08/22/23 0933 86     Resp 08/22/23 0933 18     Temp 08/22/23 0933 98.3 F (36.8 C)     Temp Source 08/22/23 0933 Oral     SpO2 08/22/23 0933 92 %     Weight --      Height --      Head Circumference --      Peak Flow --      Pain Score 08/22/23 0935 5     Pain Loc --      Pain Education --      Exclude from Growth Chart --    No data found.  Updated Vital Signs BP (!) 151/75 (BP Location: Right Arm)   Pulse 86   Temp 98.3 F (36.8 C) (Oral)   Resp 18   SpO2 92%   Visual Acuity Right Eye Distance:   Left Eye Distance:   Bilateral Distance:    Right Eye Near:   Left Eye Near:    Bilateral Near:     Physical Exam   UC Treatments / Results  Labs (all labs ordered are listed, but only abnormal results are displayed) Labs Reviewed - No data to display  EKG   Radiology No results found.  Procedures Procedures (including critical care time)  Medications Ordered in UC Medications - No data to display  Initial  Impression / Assessment and Plan / UC Course  I have reviewed the triage vital signs and the nursing notes.  Pertinent labs & imaging results that were available during my care of the patient were reviewed by me and considered in my medical decision making (see chart for details).     *** Final Clinical Impressions(s) / UC Diagnoses   Final diagnoses:  None   Discharge Instructions   None    ED Prescriptions   None    PDMP not reviewed this encounter.

## 2023-08-22 NOTE — Discharge Instructions (Addendum)
 We evaluated you for your abdominal pain.  Your CT scan did not show appendicitis, but did show inflammation in  part of your colon (cecum).  We have prescribed antibiotics to take to help treat this. Please follow-up closely with a gastroenterologist. It is important to have a colonoscopy following treatment so that the gastroenterologist can make sure that the inflammation is resolved.  Please also keep a close eye in your symptoms.  If anything worsens, such as worsening pain or you develop any new symptoms such as fevers or chills, vomiting, bloody stools, lightheadedness or dizziness, fainting, or any other new symptoms, please return to the emergency department so we can reassess you.   Please take Tylenol (acetaminophen) and Motrin (ibuprofen) for your symptoms at home.  You can take 1000 mg of Tylenol every 6 hours and 600 mg of Motrin every 6 hours as needed for your symptoms.  You can take these medicines together as needed, either at the same time, or alternating every 3 hours.

## 2023-08-22 NOTE — ED Triage Notes (Signed)
 Mid abd pain since yesterday.  Hurts to stand up or roll over.   States he feels is equilibrium is off.

## 2023-08-24 ENCOUNTER — Encounter: Payer: Self-pay | Admitting: Pulmonary Disease

## 2023-08-31 ENCOUNTER — Ambulatory Visit (INDEPENDENT_AMBULATORY_CARE_PROVIDER_SITE_OTHER): Admitting: Gastroenterology

## 2023-08-31 ENCOUNTER — Encounter: Payer: Self-pay | Admitting: Gastroenterology

## 2023-08-31 VITALS — BP 139/87 | HR 69 | Temp 98.2°F | Ht 72.0 in | Wt 276.6 lb

## 2023-08-31 DIAGNOSIS — R194 Change in bowel habit: Secondary | ICD-10-CM | POA: Diagnosis not present

## 2023-08-31 DIAGNOSIS — R109 Unspecified abdominal pain: Secondary | ICD-10-CM | POA: Insufficient documentation

## 2023-08-31 DIAGNOSIS — R933 Abnormal findings on diagnostic imaging of other parts of digestive tract: Secondary | ICD-10-CM | POA: Insufficient documentation

## 2023-08-31 DIAGNOSIS — R198 Other specified symptoms and signs involving the digestive system and abdomen: Secondary | ICD-10-CM | POA: Insufficient documentation

## 2023-08-31 NOTE — Progress Notes (Signed)
 GI Office Note    Referring Provider: Lorre Rosin, NP Primary Care Physician:  Lorre Rosin, NP  Primary Gastroenterologist: Rheba Cedar, MD   Chief Complaint   Chief Complaint  Patient presents with   Follow-up    Was seen in ED at [facility] for right quadrant pain, pain in his calf and pain in his elbow. Pt states that he is still having pain but that it is now a dull pain and not as intense. Pt is also due for a colonoscopy.      History of Present Illness   Omar Ruiz is a 73 y.o. male presenting today for ED follow up. Seen for abdominal pain. H/o sleep apnea, s/p inspire implant. H/o CAD, HTN.  Patient woke up Friday morning with right sided abdominal pain.  States he had some low-grade fever of 99.7.  He also noted right elbow pain and pain in both calves.  Describes abdominal pain as intermittent, severe spasm-like pain.  Pain would be worse with coughing, stretching.  No acute changes with his bowel habits although over the past several months he has noted that his stools have changed from more of a brown stool to a flat stool.  Typically having a bowel movement 1-2 times daily.  No blood in the stool or melena.  But any recent travel.  No ill contacts.  No recent change in medications.  Saturday morning the pain was worse so he decided to go to the urgent care.  While in the urgent care, there was concern for appendicitis therefore he was sent to the ED.  CT while in the ED showed asymmetric wall thickening of the cecum measuring 13 mm.  Appendix was decompressed.  Treated with Augmentin .   Patient states he had similar episode about 15 years ago while in [place].  They thought he had appendicitis about the time he was transported to the appropriate hospital for surgery, his pain resolved.  At this time his pain is essentially gone but is very vague.  Much better than it was when Friday.  He has had no elbow or calve pain in 1 week.  Denies any nausea or vomiting.   No heartburn or dysphagia.  He has had some increased belching.   He had an inspire implanted back in December 2023 for sleep apnea.  It is possible to have MRI if device handled appropriately, he carries a card with him.  We recommended that he bring his remote day of his procedure.     CT A/P with contrast 08/2023: IMPRESSION: 1. Asymmetric wall thickening of the cecum, measuring up to 13 mm thick (for example, axial 44). In the correct clinical context, this could reflect changes of a focal infectious or inflammatory colitis. A nonemergent colonoscopy should be considered to exclude underlying neoplasm. 2. Descending and sigmoid colonic diverticulosis. No changes of acute diverticulitis.  Colonoscopy 2014: Larrie Po Normal colon   Medications   Current Outpatient Medications  Medication Sig Dispense Refill   acetaminophen  (TYLENOL ) 500 MG tablet Take 1,000 mg by mouth daily as needed for moderate pain or mild pain.     albuterol (VENTOLIN HFA) 108 (90 Base) MCG/ACT inhaler Inhale 2 puffs into the lungs every 6 (six) hours as needed for wheezing or shortness of breath.     amoxicillin -clavulanate (AUGMENTIN ) 875-125 MG tablet Take 1 tablet by mouth every 12 (twelve) hours for 10 days. 20 tablet 0   aspirin  EC 81 MG tablet Take 81 mg by  mouth daily. Swallow whole.     cholecalciferol (VITAMIN D3) 25 MCG (1000 UNIT) tablet Take 1,000 Units by mouth daily.     ezetimibe  (ZETIA ) 10 MG tablet TAKE 1 TABLET(10 MG) BY MOUTH DAILY 90 tablet 3   metoprolol  succinate (TOPROL -XL) 25 MG 24 hr tablet TAKE 1 AND 1/2 TABLETS(37.5 MG) BY MOUTH DAILY 135 tablet 3   Misc Natural Products (YUMVS BEET ROOT-TART CHERRY PO) Take by mouth.     Multiple Vitamin (MULTIVITAMIN WITH MINERALS) TABS tablet Take 1 tablet by mouth daily.     naproxen (NAPROSYN) 500 MG tablet Take 500 mg by mouth 2 (two) times daily.     nitroGLYCERIN  (NITROSTAT ) 0.4 MG SL tablet Place 1 tablet (0.4 mg total) under the tongue every 5  (five) minutes as needed for chest pain. 25 tablet 3   simvastatin  (ZOCOR ) 40 MG tablet Take 40 mg by mouth in the morning.     No current facility-administered medications for this visit.    Allergies   Allergies as of 08/31/2023   (No Known Allergies)    Past Medical History   Past Medical History:  Diagnosis Date   Arthritis    CAD (coronary artery disease)    a. s/p DES to LAD in 2011 with low-risk NST in 02/2017   Cancer [facility])    skin cancer on hand, cancer removed   Chest pain 07/2004   2D Echo EF>55%   Hyperlipemia    Hypertension 10/2004   stress test EF 57%   Obesity    Palpitation    PSVT (paroxysmal supraventricular tachycardia) (HCC)    Sleep apnea     Past Surgical History   Past Surgical History:  Procedure Laterality Date   COLONOSCOPY  10/2004   COLONOSCOPY N/A 03/29/2013   Procedure: COLONOSCOPY;  Surgeon: Beau Bound, MD;  Location: [facility];  Service: Gastroenterology;  Laterality: N/A;   DRUG INDUCED ENDOSCOPY Bilateral 03/04/2022   Procedure: DRUG INDUCED ENDOSCOPY;  Surgeon: Virgina Grills, MD;  Location: [facility];  Service: ENT;  Laterality: Bilateral;   IMPLANTATION OF HYPOGLOSSAL NERVE STIMULATOR Right 04/16/2022   Procedure: IMPLANTATION OF HYPOGLOSSAL NERVE STIMULATOR;  Surgeon: Virgina Grills, MD;  Location: [facility] OR;  Service: ENT;  Laterality: Right;   LEFT HEART CATH AND CORONARY ANGIOGRAPHY N/A 02/06/2022   Procedure: LEFT HEART CATH AND CORONARY ANGIOGRAPHY;  Surgeon: Arty Binning, MD;  Location: [facility]VASIVE CV [facility];  Service: Cardiovascular;  Laterality: N/A;   MOUTH SURGERY     NASAL SINUS SURGERY     SKIN CANCER EXCISION     right hand    STENTS  09/05/2009   3.0x66mm promus drug eluting stent for a 90% mid LAD artery stenosis   TOTAL KNEE ARTHROPLASTY Right 08/27/2020   Procedure: TOTAL KNEE ARTHROPLASTY;  Surgeon: Liliane Rei, MD;  Location: [facility] ORS;  Service: Orthopedics;  Laterality: Right;    TOTAL  SHOULDER ARTHROPLASTY Right 12/24/2017   Procedure: RIGHT TOTAL SHOULDER ARTHROPLASTY;  Surgeon: Sammye Cristal, MD;  Location: [facility] OR;  Service: Orthopedics;  Laterality: Right;    Past Family History   Family History  Problem Relation Age of Onset   Diabetes Mother    Heart disease Father    Lung disease Other    Asthma Other    Diabetes Other    CAD Brother        3 brothers hx of cad    Past Social History   Social History  Socioeconomic History   Marital status: Married    Spouse name: Not on file   Number of children: 2   Years of education: 42   Highest education level: Not on file  Occupational History   Not on file  Tobacco Use   Smoking status: Former    Current packs/day: 0.00    Types: Cigarettes    Quit date: 05/12/1981    Years since quitting: 42.3   Smokeless tobacco: Never  Vaping Use   Vaping status: Never Used  Substance and Sexual Activity   Alcohol use: Yes    Alcohol/week: 12.0 standard drinks of alcohol    Types: 12 Cans of beer per week   Drug use: No   Sexual activity: Yes  Other Topics Concern   Not on file  Social History Narrative   Not on file   Social Drivers of Health   Financial Resource Strain: Low Risk  (02/17/2023)   Received from [facility]   Overall Financial Resource Strain (CARDIA)    Difficulty of Paying Living Expenses: Not hard at all  Food Insecurity: No Food Insecurity (02/17/2023)   Received from [facility]   Hunger Vital Sign    Worried About Running Out of Food in the Last Year: Never true    Ran Out of Food in the Last Year: Never true  Transportation Needs: No Transportation Needs (02/17/2023)   Received from [facility] - Transportation    Lack of Transportation (Medical): No    Lack of Transportation (Non-Medical): No  Physical Activity: Insufficiently Active (02/17/2023)   Received from [facility]   Exercise Vital Sign    Days of Exercise per Week: 7 days    Minutes of Exercise per  Session: 20 min  Stress: No Stress Concern Present (02/17/2023)   Received from [facility] - Occupational Stress Questionnaire    Feeling of Stress : Not at all  Social Connections: Moderately Integrated (02/17/2023)   Received from [facility]   Social Network    How would you rate your social network (family, work, friends)?: Adequate participation with social networks  Intimate Partner Violence: Not At Risk (02/17/2023)   Received from [facility]   HITS    Over the last 12 months how often did your partner physically hurt you?: Never    Over the last 12 months how often did your partner insult you or talk down to you?: Never    Over the last 12 months how often did your partner threaten you with physical harm?: Never    Over the last 12 months how often did your partner scream or curse at you?: Never    Review of Systems   General: Negative for anorexia, weight loss, fever, chills, fatigue, weakness. Eyes: Negative for vision changes.  ENT: Negative for hoarseness, difficulty swallowing , nasal congestion. CV: Negative for chest pain, angina, palpitations, dyspnea on exertion, peripheral edema.  Respiratory: Negative for dyspnea at rest, dyspnea on exertion, +cough, sputum, +wheezing.  GI: See history of present illness. GU:  Negative for dysuria, hematuria, urinary incontinence, urinary frequency, nocturnal urination.  MS: Negative for joint pain, low back pain.  See HPI Derm: Negative for rash or itching.  Neuro: Negative for weakness, abnormal sensation, seizure, frequent headaches, memory loss,  confusion.  Psych: Negative for anxiety, depression, suicidal ideation, hallucinations.  Endo: Negative for unusual weight change.  Heme: Negative for bruising or bleeding. Allergy: Negative for  rash or hives.  Physical Exam   BP 139/87 (BP Location: Right Arm, Patient Position: Sitting, Cuff Size: Large)   Pulse 69   Temp 98.2 F  (36.8 C) (Oral)   Ht 6' (1.829 m)   Wt 276 lb 9.6 oz (125.5 kg)   SpO2 94%   BMI 37.51 kg/m    General: Well-nourished, well-developed in no acute distress.  Head: Normocephalic, atraumatic.   Eyes: Conjunctiva pink, no icterus. Mouth: Oropharyngeal mucosa moist and pink   Neck: Supple without thyromegaly, masses, or lymphadenopathy.  Lungs: Expiratory wheezes bilaterally Heart: Regular rate and rhythm, no murmurs rubs or gallops.  Abdomen: Bowel sounds are normal, nontender, nondistended, no hepatosplenomegaly or masses,  no abdominal bruits or hernia, no rebound or guarding.   Rectal: Not performed Extremities: No lower extremity edema. No clubbing or deformities.  Neuro: Alert and oriented x 4 , grossly normal neurologically.  Skin: Warm and dry, no rash or jaundice.   Psych: Alert and cooperative, normal mood and affect.  Labs   Lab Results  Component Value Date   LIPASE 38 08/22/2023   Lab Results  Component Value Date   NA 137 08/22/2023   CL 102 08/22/2023   K 3.8 08/22/2023   CO2 25 08/22/2023   BUN 12 08/22/2023   CREATININE 1.09 08/22/2023   GFRNONAA >60 08/22/2023   CALCIUM  9.2 08/22/2023   PHOS 3.7 09/13/2021   ALBUMIN 4.1 08/22/2023   GLUCOSE 109 (H) 08/22/2023   Lab Results  Component Value Date   WBC 13.7 (H) 08/22/2023   HGB 14.8 08/22/2023   HCT 43.6 08/22/2023   MCV 90.6 08/22/2023   PLT 210 08/22/2023   Lab Results  Component Value Date   ALT 24 08/22/2023   AST 27 08/22/2023   ALKPHOS 64 08/22/2023   BILITOT 1.2 08/22/2023    Imaging Studies   CT ABDOMEN PELVIS W CONTRAST Result Date: 08/22/2023 CLINICAL DATA:  RLQ abdominal pain EXAM: CT ABDOMEN AND PELVIS WITH CONTRAST TECHNIQUE: Multidetector CT imaging of the abdomen and pelvis was performed using the standard protocol following bolus administration of intravenous contrast. RADIATION DOSE REDUCTION: This exam was performed according to the departmental dose-optimization program  which includes automated exposure control, adjustment of the mA and/or kV according to patient size and/or use of iterative reconstruction technique. CONTRAST:  OMNIPAQUE  IOHEXOL  300 MG/ML  SOLN COMPARISON:  January 04, 2022 FINDINGS: Lower chest: No focal airspace consolidation or pleural effusion.Multifocal subsegmental atelectasis. Hepatobiliary: No mass. Unchanged subcentimeter right hepatic lobe hypodensity, too small to definitively characterize, but likely a small cyst or biliary hamartoma.No radiopaque stones or wall thickening of the gallbladder.No intrahepatic or extrahepatic biliary ductal dilation.The portal veins are patent. Pancreas: No mass or main ductal dilation.No peripancreatic inflammation or fluid collection. Spleen: Normal size. No mass. Adrenals/Urinary Tract: No adrenal masses. No renal mass. No nephrolithiasis or hydronephrosis. The urinary bladder is distended without focal abnormality. Stomach/Bowel: The stomach is decompressed without focal abnormality. No small bowel wall thickening or inflammation. No small bowel obstruction. Decompressed appendix. Asymmetric, moderate wall thickening of the cecum measuring up to 13 mm thick (axial 44). Descending and sigmoid colonic diverticulosis. No changes of acute diverticulitis. Vascular/Lymphatic: No aortic aneurysm. Scattered aortoiliac atherosclerosis. No intraabdominal or pelvic lymphadenopathy. Reproductive: No prostatomegaly.No free pelvic fluid. Other: No pneumoperitoneum, ascites, or mesenteric inflammation. Musculoskeletal: No acute fracture or destructive lesion.Multilevel degenerative disc disease of the spine. Osteopenia. Mild joint space loss of both hips. IMPRESSION: 1. Asymmetric wall thickening of  the cecum, measuring up to 13 mm thick (for example, axial 44). In the correct clinical context, this could reflect changes of a focal infectious or inflammatory colitis. A nonemergent colonoscopy should be considered to exclude  underlying neoplasm. 2. Descending and sigmoid colonic diverticulosis. No changes of acute diverticulitis. Electronically Signed   By: Rance Burrows M.D.   On: 08/22/2023 14:51    Assessment/Plan:   Right sided abdominal pain/abnormal CT/change in bowels: -several months of change in stool shape -recent onset acute right sided abdominal pain with low grade fever seen in Urgent Care and ED suspicious for appendicitis -CT scan with decompressed appendix, asymmetric wall thickening of cecum, measuring 13mm. Reviewed CT with Dr. Rance Burrows (radiologist). Appendix overall in his opinion was not dilated and wall not thickened and appeared that primary issue was arising from the cecum although given close proximity it can make it difficult to tell.  -clinically patient improved with near resolution of his abdominal pain  -need to move towards a colonoscopy in the near future, to discuss further with Dr. Riley Cheadle. May require repeat CT imaging prior to colonoscopy especially if ongoing abdominal pain   -complete antibiotics -call if persistent or worsening abdominal pain, would recommend repeat imaging prior to colonoscopy  -will notify short stay of implanted Inspire device. Patient to bring his remote day of colonoscopy.      Trudie Fuse. Harles Lied, MHS, PA-C [facility]

## 2023-08-31 NOTE — H&P (View-Only) (Signed)
 GI Office Note    Referring Provider: Lorre Rosin, NP Primary Care Physician:  Lorre Rosin, NP  Primary Gastroenterologist: Rheba Cedar, MD   Chief Complaint   Chief Complaint  Patient presents with   Follow-up    Was seen in ED at [facility] for right quadrant pain, pain in his calf and pain in his elbow. Pt states that he is still having pain but that it is now a dull pain and not as intense. Pt is also due for a colonoscopy.      History of Present Illness   Omar Ruiz is a 73 y.o. male presenting today for ED follow up. Seen for abdominal pain. H/o sleep apnea, s/p inspire implant. H/o CAD, HTN.  Patient woke up Friday morning with right sided abdominal pain.  States he had some low-grade fever of 99.7.  He also noted right elbow pain and pain in both calves.  Describes abdominal pain as intermittent, severe spasm-like pain.  Pain would be worse with coughing, stretching.  No acute changes with his bowel habits although over the past several months he has noted that his stools have changed from more of a brown stool to a flat stool.  Typically having a bowel movement 1-2 times daily.  No blood in the stool or melena.  But any recent travel.  No ill contacts.  No recent change in medications.  Saturday morning the pain was worse so he decided to go to the urgent care.  While in the urgent care, there was concern for appendicitis therefore he was sent to the ED.  CT while in the ED showed asymmetric wall thickening of the cecum measuring 13 mm.  Appendix was decompressed.  Treated with Augmentin .   Patient states he had similar episode about 15 years ago while in [place].  They thought he had appendicitis about the time he was transported to the appropriate hospital for surgery, his pain resolved.  At this time his pain is essentially gone but is very vague.  Much better than it was when Friday.  He has had no elbow or calve pain in 1 week.  Denies any nausea or vomiting.   No heartburn or dysphagia.  He has had some increased belching.   He had an inspire implanted back in December 2023 for sleep apnea.  It is possible to have MRI if device handled appropriately, he carries a card with him.  We recommended that he bring his remote day of his procedure.     CT A/P with contrast 08/2023: IMPRESSION: 1. Asymmetric wall thickening of the cecum, measuring up to 13 mm thick (for example, axial 44). In the correct clinical context, this could reflect changes of a focal infectious or inflammatory colitis. A nonemergent colonoscopy should be considered to exclude underlying neoplasm. 2. Descending and sigmoid colonic diverticulosis. No changes of acute diverticulitis.  Colonoscopy 2014: Larrie Po Normal colon   Medications   Current Outpatient Medications  Medication Sig Dispense Refill   acetaminophen  (TYLENOL ) 500 MG tablet Take 1,000 mg by mouth daily as needed for moderate pain or mild pain.     albuterol (VENTOLIN HFA) 108 (90 Base) MCG/ACT inhaler Inhale 2 puffs into the lungs every 6 (six) hours as needed for wheezing or shortness of breath.     amoxicillin -clavulanate (AUGMENTIN ) 875-125 MG tablet Take 1 tablet by mouth every 12 (twelve) hours for 10 days. 20 tablet 0   aspirin  EC 81 MG tablet Take 81 mg by  mouth daily. Swallow whole.     cholecalciferol (VITAMIN D3) 25 MCG (1000 UNIT) tablet Take 1,000 Units by mouth daily.     ezetimibe  (ZETIA ) 10 MG tablet TAKE 1 TABLET(10 MG) BY MOUTH DAILY 90 tablet 3   metoprolol  succinate (TOPROL -XL) 25 MG 24 hr tablet TAKE 1 AND 1/2 TABLETS(37.5 MG) BY MOUTH DAILY 135 tablet 3   Misc Natural Products (YUMVS BEET ROOT-TART CHERRY PO) Take by mouth.     Multiple Vitamin (MULTIVITAMIN WITH MINERALS) TABS tablet Take 1 tablet by mouth daily.     naproxen (NAPROSYN) 500 MG tablet Take 500 mg by mouth 2 (two) times daily.     nitroGLYCERIN  (NITROSTAT ) 0.4 MG SL tablet Place 1 tablet (0.4 mg total) under the tongue every 5  (five) minutes as needed for chest pain. 25 tablet 3   simvastatin  (ZOCOR ) 40 MG tablet Take 40 mg by mouth in the morning.     No current facility-administered medications for this visit.    Allergies   Allergies as of 08/31/2023   (No Known Allergies)    Past Medical History   Past Medical History:  Diagnosis Date   Arthritis    CAD (coronary artery disease)    a. s/p DES to LAD in 2011 with low-risk NST in 02/2017   Cancer [facility])    skin cancer on hand, cancer removed   Chest pain 07/2004   2D Echo EF>55%   Hyperlipemia    Hypertension 10/2004   stress test EF 57%   Obesity    Palpitation    PSVT (paroxysmal supraventricular tachycardia) (HCC)    Sleep apnea     Past Surgical History   Past Surgical History:  Procedure Laterality Date   COLONOSCOPY  10/2004   COLONOSCOPY N/A 03/29/2013   Procedure: COLONOSCOPY;  Surgeon: Beau Bound, MD;  Location: [facility];  Service: Gastroenterology;  Laterality: N/A;   DRUG INDUCED ENDOSCOPY Bilateral 03/04/2022   Procedure: DRUG INDUCED ENDOSCOPY;  Surgeon: Virgina Grills, MD;  Location: [facility];  Service: ENT;  Laterality: Bilateral;   IMPLANTATION OF HYPOGLOSSAL NERVE STIMULATOR Right 04/16/2022   Procedure: IMPLANTATION OF HYPOGLOSSAL NERVE STIMULATOR;  Surgeon: Virgina Grills, MD;  Location: [facility] OR;  Service: ENT;  Laterality: Right;   LEFT HEART CATH AND CORONARY ANGIOGRAPHY N/A 02/06/2022   Procedure: LEFT HEART CATH AND CORONARY ANGIOGRAPHY;  Surgeon: Arty Binning, MD;  Location: [facility];  Service: Cardiovascular;  Laterality: N/A;   MOUTH SURGERY     NASAL SINUS SURGERY     SKIN CANCER EXCISION     right hand    STENTS  09/05/2009   3.0x15mm promus drug eluting stent for a 90% mid LAD artery stenosis   TOTAL KNEE ARTHROPLASTY Right 08/27/2020   Procedure: TOTAL KNEE ARTHROPLASTY;  Surgeon: Liliane Rei, MD;  Location: [facility] ORS;  Service: Orthopedics;  Laterality: Right;    TOTAL  SHOULDER ARTHROPLASTY Right 12/24/2017   Procedure: RIGHT TOTAL SHOULDER ARTHROPLASTY;  Surgeon: Sammye Cristal, MD;  Location: [facility] OR;  Service: Orthopedics;  Laterality: Right;    Past Family History   Family History  Problem Relation Age of Onset   Diabetes Mother    Heart disease Father    Lung disease Other    Asthma Other    Diabetes Other    CAD Brother        3 brothers hx of cad    Past Social History   Social History  Socioeconomic History   Marital status: Married    Spouse name: Not on file   Number of children: 2   Years of education: 42   Highest education level: Not on file  Occupational History   Not on file  Tobacco Use   Smoking status: Former    Current packs/day: 0.00    Types: Cigarettes    Quit date: 05/12/1981    Years since quitting: 42.3   Smokeless tobacco: Never  Vaping Use   Vaping status: Never Used  Substance and Sexual Activity   Alcohol use: Yes    Alcohol/week: 12.0 standard drinks of alcohol    Types: 12 Cans of beer per week   Drug use: No   Sexual activity: Yes  Other Topics Concern   Not on file  Social History Narrative   Not on file   Social Drivers of Health   Financial Resource Strain: Low Risk  (02/17/2023)   Received from [facility]   Overall Financial Resource Strain (CARDIA)    Difficulty of Paying Living Expenses: Not hard at all  Food Insecurity: No Food Insecurity (02/17/2023)   Received from [facility]   Hunger Vital Sign    Worried About Running Out of Food in the Last Year: Never true    Ran Out of Food in the Last Year: Never true  Transportation Needs: No Transportation Needs (02/17/2023)   Received from [facility] - Transportation    Lack of Transportation (Medical): No    Lack of Transportation (Non-Medical): No  Physical Activity: Insufficiently Active (02/17/2023)   Received from [facility]   Exercise Vital Sign    Days of Exercise per Week: 7 days    Minutes of Exercise per  Session: 20 min  Stress: No Stress Concern Present (02/17/2023)   Received from [facility] - Occupational Stress Questionnaire    Feeling of Stress : Not at all  Social Connections: Moderately Integrated (02/17/2023)   Received from [facility]   Social Network    How would you rate your social network (family, work, friends)?: Adequate participation with social networks  Intimate Partner Violence: Not At Risk (02/17/2023)   Received from [facility]   HITS    Over the last 12 months how often did your partner physically hurt you?: Never    Over the last 12 months how often did your partner insult you or talk down to you?: Never    Over the last 12 months how often did your partner threaten you with physical harm?: Never    Over the last 12 months how often did your partner scream or curse at you?: Never    Review of Systems   General: Negative for anorexia, weight loss, fever, chills, fatigue, weakness. Eyes: Negative for vision changes.  ENT: Negative for hoarseness, difficulty swallowing , nasal congestion. CV: Negative for chest pain, angina, palpitations, dyspnea on exertion, peripheral edema.  Respiratory: Negative for dyspnea at rest, dyspnea on exertion, +cough, sputum, +wheezing.  GI: See history of present illness. GU:  Negative for dysuria, hematuria, urinary incontinence, urinary frequency, nocturnal urination.  MS: Negative for joint pain, low back pain.  See HPI Derm: Negative for rash or itching.  Neuro: Negative for weakness, abnormal sensation, seizure, frequent headaches, memory loss,  confusion.  Psych: Negative for anxiety, depression, suicidal ideation, hallucinations.  Endo: Negative for unusual weight change.  Heme: Negative for bruising or bleeding. Allergy: Negative for  rash or hives.  Physical Exam   BP 139/87 (BP Location: Right Arm, Patient Position: Sitting, Cuff Size: Large)   Pulse 69   Temp 98.2 F  (36.8 C) (Oral)   Ht 6' (1.829 m)   Wt 276 lb 9.6 oz (125.5 kg)   SpO2 94%   BMI 37.51 kg/m    General: Well-nourished, well-developed in no acute distress.  Head: Normocephalic, atraumatic.   Eyes: Conjunctiva pink, no icterus. Mouth: Oropharyngeal mucosa moist and pink   Neck: Supple without thyromegaly, masses, or lymphadenopathy.  Lungs: Expiratory wheezes bilaterally Heart: Regular rate and rhythm, no murmurs rubs or gallops.  Abdomen: Bowel sounds are normal, nontender, nondistended, no hepatosplenomegaly or masses,  no abdominal bruits or hernia, no rebound or guarding.   Rectal: Not performed Extremities: No lower extremity edema. No clubbing or deformities.  Neuro: Alert and oriented x 4 , grossly normal neurologically.  Skin: Warm and dry, no rash or jaundice.   Psych: Alert and cooperative, normal mood and affect.  Labs   Lab Results  Component Value Date   LIPASE 38 08/22/2023   Lab Results  Component Value Date   NA 137 08/22/2023   CL 102 08/22/2023   K 3.8 08/22/2023   CO2 25 08/22/2023   BUN 12 08/22/2023   CREATININE 1.09 08/22/2023   GFRNONAA >60 08/22/2023   CALCIUM  9.2 08/22/2023   PHOS 3.7 09/13/2021   ALBUMIN 4.1 08/22/2023   GLUCOSE 109 (H) 08/22/2023   Lab Results  Component Value Date   WBC 13.7 (H) 08/22/2023   HGB 14.8 08/22/2023   HCT 43.6 08/22/2023   MCV 90.6 08/22/2023   PLT 210 08/22/2023   Lab Results  Component Value Date   ALT 24 08/22/2023   AST 27 08/22/2023   ALKPHOS 64 08/22/2023   BILITOT 1.2 08/22/2023    Imaging Studies   CT ABDOMEN PELVIS W CONTRAST Result Date: 08/22/2023 CLINICAL DATA:  RLQ abdominal pain EXAM: CT ABDOMEN AND PELVIS WITH CONTRAST TECHNIQUE: Multidetector CT imaging of the abdomen and pelvis was performed using the standard protocol following bolus administration of intravenous contrast. RADIATION DOSE REDUCTION: This exam was performed according to the departmental dose-optimization program  which includes automated exposure control, adjustment of the mA and/or kV according to patient size and/or use of iterative reconstruction technique. CONTRAST:  OMNIPAQUE  IOHEXOL  300 MG/ML  SOLN COMPARISON:  January 04, 2022 FINDINGS: Lower chest: No focal airspace consolidation or pleural effusion.Multifocal subsegmental atelectasis. Hepatobiliary: No mass. Unchanged subcentimeter right hepatic lobe hypodensity, too small to definitively characterize, but likely a small cyst or biliary hamartoma.No radiopaque stones or wall thickening of the gallbladder.No intrahepatic or extrahepatic biliary ductal dilation.The portal veins are patent. Pancreas: No mass or main ductal dilation.No peripancreatic inflammation or fluid collection. Spleen: Normal size. No mass. Adrenals/Urinary Tract: No adrenal masses. No renal mass. No nephrolithiasis or hydronephrosis. The urinary bladder is distended without focal abnormality. Stomach/Bowel: The stomach is decompressed without focal abnormality. No small bowel wall thickening or inflammation. No small bowel obstruction. Decompressed appendix. Asymmetric, moderate wall thickening of the cecum measuring up to 13 mm thick (axial 44). Descending and sigmoid colonic diverticulosis. No changes of acute diverticulitis. Vascular/Lymphatic: No aortic aneurysm. Scattered aortoiliac atherosclerosis. No intraabdominal or pelvic lymphadenopathy. Reproductive: No prostatomegaly.No free pelvic fluid. Other: No pneumoperitoneum, ascites, or mesenteric inflammation. Musculoskeletal: No acute fracture or destructive lesion.Multilevel degenerative disc disease of the spine. Osteopenia. Mild joint space loss of both hips. IMPRESSION: 1. Asymmetric wall thickening of  the cecum, measuring up to 13 mm thick (for example, axial 44). In the correct clinical context, this could reflect changes of a focal infectious or inflammatory colitis. A nonemergent colonoscopy should be considered to exclude  underlying neoplasm. 2. Descending and sigmoid colonic diverticulosis. No changes of acute diverticulitis. Electronically Signed   By: Rance Burrows M.D.   On: 08/22/2023 14:51    Assessment/Plan:   Right sided abdominal pain/abnormal CT/change in bowels: -several months of change in stool shape -recent onset acute right sided abdominal pain with low grade fever seen in Urgent Care and ED suspicious for appendicitis -CT scan with decompressed appendix, asymmetric wall thickening of cecum, measuring 13mm. Reviewed CT with Dr. Rance Burrows (radiologist). Appendix overall in his opinion was not dilated and wall not thickened and appeared that primary issue was arising from the cecum although given close proximity it can make it difficult to tell.  -clinically patient improved with near resolution of his abdominal pain  -need to move towards a colonoscopy in the near future, to discuss further with Dr. Riley Cheadle. May require repeat CT imaging prior to colonoscopy especially if ongoing abdominal pain   -complete antibiotics -call if persistent or worsening abdominal pain, would recommend repeat imaging prior to colonoscopy  -will notify short stay of implanted Inspire device. Patient to bring his remote day of colonoscopy.      Trudie Fuse. Harles Lied, MHS, PA-C [facility]

## 2023-08-31 NOTE — Patient Instructions (Signed)
 I will review your CT scan with radiology. Assuming your appendix looks ok, we will plan to move forward with a colonoscopy.  Complete your antibiotics. Please call if your abdominal pain persists.

## 2023-09-01 ENCOUNTER — Telehealth: Payer: Self-pay | Admitting: Gastroenterology

## 2023-09-01 NOTE — Telephone Encounter (Signed)
 Lmom for pt to return call

## 2023-09-01 NOTE — Telephone Encounter (Signed)
 Please let pt know that I reviewed his CT with radiologist. He feels primary concern is the cecum not the appendix. I have spoken to Dr. Riley Cheadle. He is ok with moving forward with the colonoscopy BUT he wants someone to call him a couple of days before his procedure to verify patient is NOT having abdominal pain. Of course, let patient know if he is having abdominal pain, worsening symptoms at any point, he should let us  know and we would repeat CT scan before colonoscopy.  Schedule colonoscopy with Dr. Riley Cheadle.  ASA 3. Notify endo, patient has INSPIRE implanted for sleep apnea Patient wants small volume prep.

## 2023-09-01 NOTE — Telephone Encounter (Signed)
Pt was made aware and verbalized understanding. Pt is ready to move forward with scheduling.  

## 2023-09-03 ENCOUNTER — Encounter: Payer: Self-pay | Admitting: *Deleted

## 2023-09-03 NOTE — Telephone Encounter (Signed)
 Pt has been scheduled for 09/18/23. Instructions and small volume prep placed up front for pt to pick up

## 2023-09-16 ENCOUNTER — Encounter (HOSPITAL_COMMUNITY)
Admission: RE | Admit: 2023-09-16 | Discharge: 2023-09-16 | Disposition: A | Discharge status: Home / Self Care | Source: Ambulatory Visit | Attending: Internal Medicine | Admitting: Internal Medicine

## 2023-09-16 ENCOUNTER — Other Ambulatory Visit: Payer: Self-pay

## 2023-09-16 ENCOUNTER — Encounter (HOSPITAL_COMMUNITY): Payer: Self-pay

## 2023-09-18 ENCOUNTER — Ambulatory Visit (HOSPITAL_COMMUNITY): Admitting: Certified Registered"

## 2023-09-18 ENCOUNTER — Ambulatory Visit (HOSPITAL_COMMUNITY)
Admission: RE | Admit: 2023-09-18 | Discharge: 2023-09-18 | Disposition: A | Discharge status: Home / Self Care | Attending: Internal Medicine | Admitting: Internal Medicine

## 2023-09-18 ENCOUNTER — Encounter (HOSPITAL_COMMUNITY): Payer: Self-pay | Admitting: Internal Medicine

## 2023-09-18 ENCOUNTER — Encounter (HOSPITAL_COMMUNITY)
Admission: RE | Disposition: A | Discharge status: Home / Self Care | Payer: Self-pay | Source: Home / Self Care | Attending: Internal Medicine

## 2023-09-18 DIAGNOSIS — E785 Hyperlipidemia, unspecified: Secondary | ICD-10-CM | POA: Insufficient documentation

## 2023-09-18 DIAGNOSIS — G4730 Sleep apnea, unspecified: Secondary | ICD-10-CM | POA: Insufficient documentation

## 2023-09-18 DIAGNOSIS — K5730 Diverticulosis of large intestine without perforation or abscess without bleeding: Secondary | ICD-10-CM

## 2023-09-18 DIAGNOSIS — Z7982 Long term (current) use of aspirin: Secondary | ICD-10-CM | POA: Diagnosis not present

## 2023-09-18 DIAGNOSIS — Z6837 Body mass index (BMI) 37.0-37.9, adult: Secondary | ICD-10-CM | POA: Diagnosis not present

## 2023-09-18 DIAGNOSIS — I10 Essential (primary) hypertension: Secondary | ICD-10-CM | POA: Insufficient documentation

## 2023-09-18 DIAGNOSIS — D122 Benign neoplasm of ascending colon: Secondary | ICD-10-CM | POA: Diagnosis not present

## 2023-09-18 DIAGNOSIS — F32A Depression, unspecified: Secondary | ICD-10-CM | POA: Diagnosis not present

## 2023-09-18 DIAGNOSIS — R933 Abnormal findings on diagnostic imaging of other parts of digestive tract: Secondary | ICD-10-CM | POA: Diagnosis present

## 2023-09-18 DIAGNOSIS — I2510 Atherosclerotic heart disease of native coronary artery without angina pectoris: Secondary | ICD-10-CM | POA: Insufficient documentation

## 2023-09-18 DIAGNOSIS — J449 Chronic obstructive pulmonary disease, unspecified: Secondary | ICD-10-CM | POA: Insufficient documentation

## 2023-09-18 DIAGNOSIS — K529 Noninfective gastroenteritis and colitis, unspecified: Secondary | ICD-10-CM

## 2023-09-18 DIAGNOSIS — Z79899 Other long term (current) drug therapy: Secondary | ICD-10-CM | POA: Insufficient documentation

## 2023-09-18 DIAGNOSIS — Z87891 Personal history of nicotine dependence: Secondary | ICD-10-CM | POA: Insufficient documentation

## 2023-09-18 DIAGNOSIS — I708 Atherosclerosis of other arteries: Secondary | ICD-10-CM | POA: Diagnosis not present

## 2023-09-18 DIAGNOSIS — Z791 Long term (current) use of non-steroidal anti-inflammatories (NSAID): Secondary | ICD-10-CM | POA: Diagnosis not present

## 2023-09-18 DIAGNOSIS — K635 Polyp of colon: Secondary | ICD-10-CM | POA: Diagnosis not present

## 2023-09-18 DIAGNOSIS — E669 Obesity, unspecified: Secondary | ICD-10-CM | POA: Diagnosis not present

## 2023-09-18 DIAGNOSIS — K633 Ulcer of intestine: Secondary | ICD-10-CM

## 2023-09-18 HISTORY — PX: COLONOSCOPY: SHX5424

## 2023-09-18 SURGERY — COLONOSCOPY
Anesthesia: General

## 2023-09-18 MED ORDER — EPHEDRINE SULFATE-NACL 50-0.9 MG/10ML-% IV SOSY
PREFILLED_SYRINGE | INTRAVENOUS | Status: DC | PRN
  Administered 2023-09-18: 5 mg via INTRAVENOUS
  Administered 2023-09-18 (×2): 10 mg via INTRAVENOUS

## 2023-09-18 MED ORDER — PROPOFOL 10 MG/ML IV BOLUS
INTRAVENOUS | Status: DC | PRN
Start: 2023-09-18 — End: 2023-09-18
  Administered 2023-09-18: 80 mg via INTRAVENOUS
  Administered 2023-09-18: 100 ug/kg/min via INTRAVENOUS

## 2023-09-18 MED ORDER — PHENYLEPHRINE 80 MCG/ML (10ML) SYRINGE FOR IV PUSH (FOR BLOOD PRESSURE SUPPORT)
PREFILLED_SYRINGE | INTRAVENOUS | Status: DC | PRN
  Administered 2023-09-18 (×6): 80 ug via INTRAVENOUS

## 2023-09-18 MED ORDER — LACTATED RINGERS IV SOLN: INTRAVENOUS | Status: DC | PRN

## 2023-09-18 MED ORDER — LIDOCAINE 2% (20 MG/ML) 5 ML SYRINGE
INTRAMUSCULAR | Status: DC | PRN
  Administered 2023-09-18: 100 mg via INTRAVENOUS

## 2023-09-18 NOTE — Transfer of Care (Signed)
 Immediate Anesthesia Transfer of Care Note  Patient: Omar Ruiz  Procedure(s) Performed: COLONOSCOPY  Patient Location: PACU  Anesthesia Type:General  Level of Consciousness: awake, alert , oriented, and patient cooperative  Airway & Oxygen Therapy: Patient Spontanous Breathing  Post-op Assessment: Report given to RN, Post -op Vital signs reviewed and stable, and Patient moving all extremities X 4  Post vital signs: Reviewed and stable  Last Vitals:  Vitals Value Taken Time  BP 118/56 09/18/23 0934  Temp 36.4 C 09/18/23 0933  Pulse 58 09/18/23 0933  Resp 15 09/18/23 0933  SpO2 100 % 09/18/23 0933    Last Pain:  Vitals:   09/18/23 0933  TempSrc: Oral  PainSc:          Complications: No notable events documented.

## 2023-09-18 NOTE — Anesthesia Preprocedure Evaluation (Signed)
 Anesthesia Evaluation  Patient identified by MRN, date of birth, ID band Patient awake    Reviewed: Allergy & Precautions, H&P , NPO status , Patient's Chart, lab work & pertinent test results, reviewed documented beta blocker date and time   Airway Mallampati: II  TM Distance: >3 FB Neck ROM: full    Dental no notable dental hx.    Pulmonary sleep apnea , COPD, former smoker   Pulmonary exam normal breath sounds clear to auscultation       Cardiovascular Exercise Tolerance: Good hypertension, + CAD   Rhythm:regular Rate:Normal     Neuro/Psych  PSYCHIATRIC DISORDERS  Depression    negative neurological ROS     GI/Hepatic negative GI ROS, Neg liver ROS,,,  Endo/Other  negative endocrine ROS    Renal/GU negative Renal ROS  negative genitourinary   Musculoskeletal   Abdominal   Peds  Hematology negative hematology ROS (+)   Anesthesia Other Findings   Reproductive/Obstetrics negative OB ROS                             Anesthesia Physical Anesthesia Plan  ASA: 3  Anesthesia Plan: General   Post-op Pain Management:    Induction:   PONV Risk Score and Plan: Propofol  infusion  Airway Management Planned:   Additional Equipment:   Intra-op Plan:   Post-operative Plan:   Informed Consent: I have reviewed the patients History and Physical, chart, labs and discussed the procedure including the risks, benefits and alternatives for the proposed anesthesia with the patient or authorized representative who has indicated his/her understanding and acceptance.     Dental Advisory Given  Plan Discussed with: CRNA  Anesthesia Plan Comments:        Anesthesia Quick Evaluation

## 2023-09-18 NOTE — Interval H&P Note (Signed)
 History and Physical Interval Note:  09/18/2023 8:44 AM  Omar Ruiz  has presented today for surgery, with the diagnosis of Abnormal CT scan, colon, abdominal pain.  The various methods of treatment have been discussed with the patient and family. After consideration of risks, benefits and other options for treatment, the patient has consented to  Procedure(s) with comments: COLONOSCOPY (N/A) - 8:30 AM, ASA 3 as a surgical intervention.  The patient's history has been reviewed, patient examined, no change in status, stable for surgery.  I have reviewed the patient's chart and labs.  Questions were answered to the patient's satisfaction.     Omar Ruiz  No change but abdominal pain has absolutely resolved.  Bowel function reported to be normal.  No rectal bleeding or dark stools.  Diagnostic colonoscopy per plan today  The risks, benefits, limitations, alternatives and imponderables have been reviewed with the patient. Questions have been answered. All parties are agreeable.

## 2023-09-18 NOTE — Discharge Instructions (Addendum)
  Colonoscopy Discharge Instructions  Read the instructions outlined below and refer to this sheet in the next few weeks. These discharge instructions provide you with general information on caring for yourself after you leave the hospital. Your doctor may also give you specific instructions. While your treatment has been planned according to the most current medical practices available, unavoidable complications occasionally occur. If you have any problems or questions after discharge, call Dr. Riley Cheadle at [phone]. ACTIVITY You may resume your regular activity, but move at a slower pace for the next 24 hours.  Take frequent rest periods for the next 24 hours.  Walking will help get rid of the air and reduce the bloated feeling in your belly (abdomen).  No driving for 24 hours (because of the medicine (anesthesia) used during the test).   Do not sign any important legal documents or operate any machinery for 24 hours (because of the anesthesia used during the test).  NUTRITION Drink plenty of fluids.  You may resume your normal diet as instructed by your doctor.  Begin with a light meal and progress to your normal diet. Heavy or fried foods are harder to digest and may make you feel sick to your stomach (nauseated).  Avoid alcoholic beverages for 24 hours or as instructed.  MEDICATIONS You may resume your normal medications unless your doctor tells you otherwise.  WHAT YOU CAN EXPECT TODAY Some feelings of bloating in the abdomen.  Passage of more gas than usual.  Spotting of blood in your stool or on the toilet paper.  IF YOU HAD POLYPS REMOVED DURING THE COLONOSCOPY: No aspirin  products for 7 days or as instructed.  No alcohol for 7 days or as instructed.  Eat a soft diet for the next 24 hours.  FINDING OUT THE RESULTS OF YOUR TEST Not all test results are available during your visit. If your test results are not back during the visit, make an appointment with your caregiver to find out the  results. Do not assume everything is normal if you have not heard from your caregiver or the medical facility. It is important for you to follow up on all of your test results.  SEEK IMMEDIATE MEDICAL ATTENTION IF: You have more than a spotting of blood in your stool.  Your belly is swollen (abdominal distention).  You are nauseated or vomiting.  You have a temperature over 101.  You have abdominal pain or discomfort that is severe or gets worse throughout the day.       2 polyps found and removed today  Diverticulosis found  In the right side of your colon you do have ulcers no biopsies taken.    We need to do a special CT CT angiogram of the abdomen to look at your mesenteric blood vessels.  This can be done as an outpatient.  Further recommendations to follow pending review of pathology report  At patient request, I called Steele Zimmerly at [phone] reviewed findings and recommendations

## 2023-09-18 NOTE — Op Note (Signed)
 [facility] Patient Name: Omar Ruiz Procedure Date: 09/18/2023 8:37 AM MRN: [id number] Date of Birth: [birth year] Attending MD: Gemma Kelp , MD, [id number] CSN: [id number] Age: 73 Admit Type: Outpatient Procedure:                Colonoscopy Indications:              Abnormal CT of the GI tract Providers:                Gemma Kelp, MD, Pasco Bond, RN, Annell Barrow Referring MD:              Medicines:                Propofol  per Anesthesia Complications:            No immediate complications. Estimated Blood Loss:     MinimalEstimated blood loss: none. Procedure:                After obtaining informed consent, the colonoscope                            was passed under direct vision. Throughout the                            procedure, the patient's blood pressure, pulse, and                            oxygen saturations were monitored continuously. The                            [id number]) scope was introduced through the                            anus and advanced to the 5 cm into the ileum. The                            colonoscopy was performed with ease. The patient                            tolerated the procedure well. The quality of the                            bowel preparation was adequate. Scope In: 8:56:46 AM Scope Out: 9:27:39 AM Scope Withdrawal Time: 0 hours 26 minutes 49 seconds  Total Procedure Duration: 0 hours 30 minutes 53 seconds  Findings:      The perianal and digital rectal examinations were normal.      Scattered medium-mouthed diverticula were found in the sigmoid colon and       descending colon.      Two semi-pedunculated polyps were found in the ascending colon. The       polyps were 6 to 7 mm in size. These polyps were removed with a cold       snare. Resection and retrieval were complete. Estimated blood loss was       minimal.  Triangular shaped ulcer about 11 mm adjacent to  the appendiceal orifice       there was also a "comet tail" component approximately 2 cm in length. I       intubated the terminal ileum. There was friability of the TI without       frank ulceration. There was slight amount of blood in the TI.      Biopsies of the cecal ulcer and TI taken for histologic study Impression:               - Diverticulosis in the sigmoid colon and in the                            descending colon.                           - Two 6 to 7 mm polyps in the ascending colon,                            removed with a cold snare. Resected and retrieved.                           - Cecal ulceration with terminal ileitis. Biopsies                            taken.                           - Need to rule out mesenteric stenosis here. Moderate Sedation:      Moderate (conscious) sedation was personally administered by an       anesthesia professional. The following parameters were monitored: oxygen       saturation, heart rate, blood pressure, respiratory rate, EKG, adequacy       of pulmonary ventilation, and response to care. Recommendation:           - Patient has a contact number available for                            emergencies. The signs and symptoms of potential                            delayed complications were discussed with the                            patient. Return to normal activities tomorrow.                            Written discharge instructions were provided to the                            patient.                           - Advance diet as tolerated. Follow-up on                            pathology.  CT angiogram of the abdomen to evaluate                            for right sided colonic ischemia. Further                            recommendations to follow. Procedure Code(s):        --- Professional ---                           [phone], Colonoscopy, flexible; with removal of                            tumor(s), polyp(s), or other  lesion(s) by snare                            technique Diagnosis Code(s):        --- Professional ---                           D12.2, Benign neoplasm of ascending colon                           K57.30, Diverticulosis of large intestine without                            perforation or abscess without bleeding                           R93.3, Abnormal findings on diagnostic imaging of                            other parts of digestive tract CPT copyright 2022 American Medical Association. All rights reserved. The codes documented in this report are preliminary and upon coder review may  be revised to meet current compliance requirements. Windsor Hatcher. Ayden Hardwick, MD Gemma Kelp, MD 09/18/2023 9:51:54 AM This report has been signed electronically. Number of Addenda: 0

## 2023-09-21 ENCOUNTER — Other Ambulatory Visit: Payer: Self-pay | Admitting: *Deleted

## 2023-09-21 ENCOUNTER — Encounter (HOSPITAL_COMMUNITY): Payer: Self-pay | Admitting: Internal Medicine

## 2023-09-21 DIAGNOSIS — K559 Vascular disorder of intestine, unspecified: Secondary | ICD-10-CM

## 2023-09-21 LAB — SURGICAL PATHOLOGY

## 2023-09-30 ENCOUNTER — Ambulatory Visit (HOSPITAL_COMMUNITY)
Admission: RE | Admit: 2023-09-30 | Discharge: 2023-09-30 | Disposition: A | Discharge status: Home / Self Care | Source: Ambulatory Visit | Attending: Internal Medicine | Admitting: Internal Medicine

## 2023-09-30 DIAGNOSIS — K559 Vascular disorder of intestine, unspecified: Secondary | ICD-10-CM | POA: Insufficient documentation

## 2023-09-30 MED ORDER — IOHEXOL 350 MG/ML SOLN
100.0000 mL | Freq: Once | INTRAVENOUS | Status: AC | PRN
  Administered 2023-09-30: 100 mL via INTRAVENOUS

## 2023-10-02 ENCOUNTER — Ambulatory Visit: Payer: Self-pay | Admitting: Internal Medicine

## 2023-10-06 ENCOUNTER — Encounter: Payer: Self-pay | Admitting: Adult Health

## 2023-10-06 ENCOUNTER — Encounter (HOSPITAL_BASED_OUTPATIENT_CLINIC_OR_DEPARTMENT_OTHER): Payer: Self-pay | Admitting: Adult Health

## 2023-10-06 ENCOUNTER — Ambulatory Visit (HOSPITAL_BASED_OUTPATIENT_CLINIC_OR_DEPARTMENT_OTHER): Admitting: Adult Health

## 2023-10-06 VITALS — BP 144/81 | HR 68 | Ht 72.0 in | Wt 272.6 lb

## 2023-10-06 DIAGNOSIS — Z87891 Personal history of nicotine dependence: Secondary | ICD-10-CM

## 2023-10-06 DIAGNOSIS — G4733 Obstructive sleep apnea (adult) (pediatric): Secondary | ICD-10-CM

## 2023-10-06 NOTE — Assessment & Plan Note (Signed)
 Obstructive sleep apnea CPAP intolerant status post inspire implant. Has excellent compliance and perceived benefit on inspire.  Titration study in May 2024 showed optimal control on 1.6 V.  Last visit patient was unable to tolerate.  Concern for possible overstimulation.  Electrode configuration changed to electrode B with range at 0.3-0.7.  Currently on 0.6 and tolerating well.  Will check home sleep study to make sure therapeutic at current settings.  Plan  Patient Instructions  Continue on Inspire each night. Continue on current level 0.6v (Level 4)  Set up home sleep study.  Follow up with Dr. Villa Greaser or Shiann Kam NP in 6 weeks and As needed

## 2023-10-06 NOTE — Patient Instructions (Addendum)
 Continue on Inspire each night. Continue on current level 0.6v (Level 4)  Set up home sleep study.  Follow up with Dr. Villa Greaser or Terrelle Ruffolo NP in 6 weeks and As needed

## 2023-10-06 NOTE — Progress Notes (Signed)
 @Patient  ID: Omar Ruiz, male    DOB: August 27, 1950, 73 y.o.   MRN: [id number]  Chief Complaint  Patient presents with   Follow-up    [facility]A INSPIRE    Referring provider: Lorre Rosin, NP  HPI: 73 year old male followed for obstructive sleep apnea-CPAP intolerance status post inspire implantation  TEST/EVENTS :  PFT 03/18/17 >> FEV1 3.38 (87%), FEV1% 81, TLC 7.19 (94%), DLCO 79%   HST 01/14/22 >> AHI 16.4, SpO2 low 70% Inspire implantation April 16, 2022 Inspire device activation June 24, 2022 Inspire titration 09/23/22 >> 1.6 V and non-supine position.   Echo 09/13/21 >> EF 65 to 70%, mild LVH, severe RA dilation, aortic root 37 mm   10/06/23 Follow up : [facility]  Patient presents for a 6-week follow-up.  Patient has moderate obstructive sleep apnea.  Was intolerant to CPAP.  He underwent inspire implantation April 16, 2022. Activation June 24, 2022.  Inspire titration on Sep 23, 2022 showed optimal control on 1.6 V.  Required pulse width change June 2024 due to buzzing sound on higher level. Changed pulse width 90/33 to 90/40 due to buzzing sound on higher level . Last visit patient was having trouble tolerating higher dose of stimulation was changed to electrode B configuration with range at 0.3 to 0.7 V.  He was set at 0.4-level 2.  Compliance shows good compliance at 87%.  Daily average usage at 7 hours.  Currently on 0.6 V.  Waveform appears normal.  Stimulation test shows tongue protrusion is normal and comfortable. Patient says he is doing well.  Feels that he has less daytime sleepiness.  Patient does get up several times throughout the night but this is to go to the bathroom for urination.    No Known Allergies  Immunization History  Administered Date(s) Administered   Fluad Quad(high Dose 65+) 02/11/2022   Influenza Inj Mdck Quad Pf 01/21/2021   Influenza,inj,Quad PF,6+ Mos 02/22/2015   Influenza,inj,quad, With Preservative 03/13/2019   Influenza-Unspecified  02/22/2015, 03/13/2019   Moderna Sars-Covid-2 Vaccination 09/14/2019, 10/05/2019   PNEUMOCOCCAL CONJUGATE-20 12/10/2020   Unspecified SARS-COV-2 Vaccination 09/14/2019    Past Medical History:  Diagnosis Date   Arthritis    CAD (coronary artery disease)    a. s/p DES to LAD in 2011 with low-risk NST in 02/2017   Cancer (HCC)    skin cancer on hand, cancer removed   Chest pain 07/2004   2D Echo EF>55%   COPD (chronic obstructive pulmonary disease) (HCC)    Hyperlipemia    Hypertension 10/2004   stress test EF 57%   Obesity    Palpitation    PSVT (paroxysmal supraventricular tachycardia) (HCC)    Sleep apnea     Tobacco History: Social History   Tobacco Use  Smoking Status Former   Current packs/day: 0.00   Types: Cigarettes   Quit date: 05/12/1981   Years since quitting: 42.4  Smokeless Tobacco Never   Counseling given: Not Answered   Outpatient Medications Prior to Visit  Medication Sig Dispense Refill   acetaminophen  (TYLENOL ) 500 MG tablet Take 1,000 mg by mouth daily as needed for moderate pain or mild pain.     albuterol (VENTOLIN HFA) 108 (90 Base) MCG/ACT inhaler Inhale 2 puffs into the lungs every 6 (six) hours as needed for wheezing or shortness of breath.     aspirin  EC 81 MG tablet Take 81 mg by mouth daily. Swallow whole.     cholecalciferol (VITAMIN D3) 25 MCG (1000 UNIT) tablet Take  1,000 Units by mouth daily.     ezetimibe  (ZETIA ) 10 MG tablet TAKE 1 TABLET(10 MG) BY MOUTH DAILY 90 tablet 3   metoprolol  succinate (TOPROL -XL) 25 MG 24 hr tablet TAKE 1 AND 1/2 TABLETS(37.5 MG) BY MOUTH DAILY 135 tablet 3   Misc Natural Products (YUMVS BEET ROOT-TART CHERRY PO) Take by mouth.     Multiple Vitamin (MULTIVITAMIN WITH MINERALS) TABS tablet Take 1 tablet by mouth daily.     naproxen (NAPROSYN) 500 MG tablet Take 500 mg by mouth 2 (two) times daily.     nitroGLYCERIN  (NITROSTAT ) 0.4 MG SL tablet Place 1 tablet (0.4 mg total) under the tongue every 5 (five)  minutes as needed for chest pain. 25 tablet 3   simvastatin  (ZOCOR ) 40 MG tablet Take 40 mg by mouth in the morning.     No facility-administered medications prior to visit.     Review of Systems:   Constitutional:   No  weight loss, night sweats,  Fevers, chills, fatigue, or  lassitude.  HEENT:   No headaches,  Difficulty swallowing,  Tooth/dental problems, or  Sore throat,                No sneezing, itching, ear ache, nasal congestion, post nasal drip,   CV:  No chest pain,  Orthopnea, PND, swelling in lower extremities, anasarca, dizziness, palpitations, syncope.   GI  No heartburn, indigestion, abdominal pain, nausea, vomiting, diarrhea, change in bowel habits, loss of appetite, bloody stools.   Resp: No shortness of breath with exertion or at rest.  No excess mucus, no productive cough,  No non-productive cough,  No coughing up of blood.  No change in color of mucus.  No wheezing.  No chest wall deformity  Skin: no rash or lesions.  GU: no dysuria, change in color of urine, no urgency or frequency.  No flank pain, no hematuria   MS:  No joint pain or swelling.  No decreased range of motion.  No back pain.    Physical Exam  BP (!) 144/81   Pulse 68   Ht 6' (1.829 m)   Wt 272 lb 9.6 oz (123.7 kg)   SpO2 97%   BMI 36.97 kg/m   GEN: A/Ox3; pleasant , NAD, well nourished    HEENT:  Heath/AT,   NOSE-clear, THROAT-clear, no lesions, no postnasal drip or exudate noted.  Normal tongue movement  NECK:  Supple w/ fair ROM; no JVD; normal carotid impulses w/o bruits; no thyromegaly or nodules palpated; no lymphadenopathy.    RESP  Clear  P & A; w/o, wheezes/ rales/ or rhonchi. no accessory muscle use, no dullness to percussion  CARD:  RRR, no m/r/g, no peripheral edema, pulses intact, no cyanosis or clubbing.  GI:   Soft & nt; nml bowel sounds; no organomegaly or masses detected.   Musco: Warm bil, no deformities or joint swelling noted.   Neuro: alert, no focal deficits  noted.    Skin: Warm, no lesions or rashes    Lab Results:    BMET   BNP No results found for: "BNP"  ProBNP No results found for: "PROBNP"  Imaging:   Administration History     None          Latest Ref Rng & Units 03/18/2017    1:41 PM  PFT Results  FVC-Pre L 4.17   FVC-Predicted Pre % 81   Pre FEV1/FVC % % 81   FEV1-Pre L 3.38   FEV1-Predicted Pre %  87   DLCO uncorrected ml/min/mmHg 28.86   DLCO UNC% % 79   DLCO corrected ml/min/mmHg 28.86   DLCO COR %Predicted % 79   DLVA Predicted % 93   TLC L 7.19   TLC % Predicted % 94   RV % Predicted % 116     No results found for: "NITRICOXIDE"      Assessment & Plan:   Obstructive sleep apnea Obstructive sleep apnea CPAP intolerant status post inspire implant. Has excellent compliance and perceived benefit on inspire.  Titration study in May 2024 showed optimal control on 1.6 V.  Last visit patient was unable to tolerate.  Concern for possible overstimulation.  Electrode configuration changed to electrode B with range at 0.3-0.7.  Currently on 0.6 and tolerating well.  Will check home sleep study to make sure therapeutic at current settings.  Plan  Patient Instructions  Continue on Inspire each night. Continue on current level 0.6v (Level 4)  Set up home sleep study.  Follow up with Dr. Villa Greaser or Taliana Mersereau NP in 6 weeks and As needed      I spent 33   minutes dedicated to the care of this patient on the date of this encounter to include pre-visit review of records, face-to-face time with the patient discussing conditions above, post visit ordering of testing, clinical documentation with the electronic health record, making appropriate referrals as documented, and communicating necessary findings to members of the patients care team.    Roena Clark, NP 10/06/2023

## 2023-10-07 ENCOUNTER — Encounter (HOSPITAL_BASED_OUTPATIENT_CLINIC_OR_DEPARTMENT_OTHER)

## 2023-10-09 NOTE — Anesthesia Postprocedure Evaluation (Signed)
 Anesthesia Post Note  Patient: Omar Ruiz  Procedure(s) Performed: COLONOSCOPY  Patient location during evaluation: Phase II Anesthesia Type: General Level of consciousness: awake Pain management: pain level controlled Vital Signs Assessment: post-procedure vital signs reviewed and stable Respiratory status: spontaneous breathing and respiratory function stable Cardiovascular status: blood pressure returned to baseline and stable Postop Assessment: no headache and no apparent nausea or vomiting Anesthetic complications: no Comments: Late entry   No notable events documented.   Last Vitals:  Vitals:   09/18/23 0934 09/18/23 0937  BP: (!) 118/56 139/72  Pulse:    Resp:    Temp:    SpO2:      Last Pain:  Vitals:   09/21/23 1414  TempSrc:   PainSc: 0-No pain                 Coretha Dew

## 2023-10-12 ENCOUNTER — Ambulatory Visit (HOSPITAL_BASED_OUTPATIENT_CLINIC_OR_DEPARTMENT_OTHER)

## 2023-10-12 DIAGNOSIS — G4733 Obstructive sleep apnea (adult) (pediatric): Secondary | ICD-10-CM

## 2023-10-13 ENCOUNTER — Ambulatory Visit: Admitting: Adult Health

## 2023-10-19 ENCOUNTER — Encounter: Payer: Self-pay | Admitting: Gastroenterology

## 2023-10-19 ENCOUNTER — Ambulatory Visit (INDEPENDENT_AMBULATORY_CARE_PROVIDER_SITE_OTHER): Admitting: Gastroenterology

## 2023-10-19 VITALS — BP 135/87 | HR 65 | Temp 98.0°F | Ht 72.0 in | Wt 274.0 lb

## 2023-10-19 DIAGNOSIS — Z860101 Personal history of adenomatous and serrated colon polyps: Secondary | ICD-10-CM | POA: Diagnosis not present

## 2023-10-19 DIAGNOSIS — K529 Noninfective gastroenteritis and colitis, unspecified: Secondary | ICD-10-CM | POA: Diagnosis not present

## 2023-10-19 DIAGNOSIS — K633 Ulcer of intestine: Secondary | ICD-10-CM | POA: Insufficient documentation

## 2023-10-19 NOTE — Progress Notes (Signed)
 GI Office Note    Referring Provider: Lorre Rosin, NP Primary Care Physician:  Lorre Rosin, NP  Primary Gastroenterologist: Rheba Cedar, MD   Chief Complaint   Chief Complaint  Patient presents with   Follow-up    Doing well, no issues    History of Present Illness   Omar Ruiz is a 73 y.o. male presenting today for follow up. Last seen in office in 08/2023.   Patient with infrequent episodic RLQ pain. But with several month h/o change in stool shape. Recent evaluation in 08/2023 with CT showing asymmetric wall thickening of cecum, measuring up to 13mm thick. Similar presentation while in [place] over 15 years ago, thought he had appendicitis both times. Most recent episode resolved within 3 days, started improving after antibiotics given.   Colonoscopy completed showing cecal ulceration with terminal ileitis. Ileal bx unremarkable. Cecal bx with active colitis and exudate c/w ulcer, ddx including IBD vs ischemia.   CTA A/P 09/30/23: patent mesenteric arteries  Today: notes he was on naprosyn couple a day chronically for arthritic pain. He has since stopped. Does take ASA 81mg  daily. He is feeling well from a GI standpoint. BM 1-2 times per day. Stools are formed. No melena, brbpr. No recurrent RLQ pain. Appetite is good.     Colonoscopy 09/18/23: -diverticulosis in sigmoid colon and descending colon -two 6-31mm polyps in ascending colon -cecal ulceration with terminal ileitis -need to rule out mesenteric stenosis -biopsies from ileum unremarkable. From cecum with chronic active colitis and exudate c/w ulcer, ddx includes IBD. Colon polyp sessile serrated adenoma  -f/u [facility] in 3 years   Medications   Current Outpatient Medications  Medication Sig Dispense Refill   acetaminophen  (TYLENOL ) 500 MG tablet Take 1,000 mg by mouth daily as needed for moderate pain or mild pain.     albuterol (VENTOLIN HFA) 108 (90 Base) MCG/ACT inhaler Inhale 2 puffs into the lungs  every 6 (six) hours as needed for wheezing or shortness of breath.     aspirin  EC 81 MG tablet Take 81 mg by mouth daily. Swallow whole.     cholecalciferol (VITAMIN D3) 25 MCG (1000 UNIT) tablet Take 1,000 Units by mouth daily.     ezetimibe  (ZETIA ) 10 MG tablet TAKE 1 TABLET(10 MG) BY MOUTH DAILY 90 tablet 3   metoprolol  succinate (TOPROL -XL) 25 MG 24 hr tablet TAKE 1 AND 1/2 TABLETS(37.5 MG) BY MOUTH DAILY 135 tablet 3   Misc Natural Products (YUMVS BEET ROOT-TART CHERRY PO) Take by mouth.     Multiple Vitamin (MULTIVITAMIN WITH MINERALS) TABS tablet Take 1 tablet by mouth daily.     nitroGLYCERIN  (NITROSTAT ) 0.4 MG SL tablet Place 1 tablet (0.4 mg total) under the tongue every 5 (five) minutes as needed for chest pain. 25 tablet 3   simvastatin  (ZOCOR ) 40 MG tablet Take 40 mg by mouth in the morning.     No current facility-administered medications for this visit.    Allergies   Allergies as of 10/19/2023   (No Known Allergies)        Review of Systems   General: Negative for anorexia, weight loss, fever, chills, fatigue, weakness. ENT: Negative for hoarseness, difficulty swallowing , nasal congestion. CV: Negative for chest pain, angina, palpitations, dyspnea on exertion, peripheral edema.  Respiratory: Negative for dyspnea at rest, dyspnea on exertion, cough, sputum, wheezing.  GI: See history of present illness. GU:  Negative for dysuria, hematuria, urinary incontinence, urinary frequency, nocturnal urination.  Endo:  Negative for unusual weight change.     Physical Exam   BP 135/87 (BP Location: Right Arm, Patient Position: Sitting, Cuff Size: Large)   Pulse 65   Temp 98 F (36.7 C) (Oral)   Ht 6' (1.829 m)   Wt 274 lb (124.3 kg)   SpO2 92%   BMI 37.16 kg/m    General: Well-nourished, well-developed in no acute distress.  Eyes: No icterus. Mouth: Oropharyngeal mucosa moist and pink   Abdomen: Bowel sounds are normal, nontender, nondistended, no hepatosplenomegaly  or masses,  no abdominal bruits or hernia , no rebound or guarding.  Rectal: not performed  Extremities: No lower extremity edema. No clubbing or deformities. Neuro: Alert and oriented x 4   Skin: Warm and dry, no jaundice.   Psych: Alert and cooperative, normal mood and affect.  Labs   Lab Results  Component Value Date   NA 137 08/22/2023   CL 102 08/22/2023   K 3.8 08/22/2023   CO2 25 08/22/2023   BUN 12 08/22/2023   CREATININE 1.09 08/22/2023   GFRNONAA >60 08/22/2023   CALCIUM  9.2 08/22/2023   PHOS 3.7 09/13/2021   ALBUMIN 4.1 08/22/2023   GLUCOSE 109 (H) 08/22/2023   Lab Results  Component Value Date   ALT 24 08/22/2023   AST 27 08/22/2023   ALKPHOS 64 08/22/2023   BILITOT 1.2 08/22/2023   Lab Results  Component Value Date   WBC 13.7 (H) 08/22/2023   HGB 14.8 08/22/2023   HCT 43.6 08/22/2023   MCV 90.6 08/22/2023   PLT 210 08/22/2023    Imaging Studies   CT ANGIO ABDOMEN PELVIS  W & WO CONTRAST Result Date: 09/30/2023 EXAMINATION: CT ANGIO ABDOMEN PELVIS  W & WO CONTRAST CLINICAL INDICATION: Male, 73 years old. rule out right sided intestinal ischemia. TECHNIQUE: Axial CTA of the abdomen and pelvis with and without 100 cc Omnipaque  300 intravenous contrast. Multiplanar and 3D reformations provided. Unless otherwise specified, incidental thyroid , adrenal, renal lesions do not require dedicated imaging follow up. Additionally, any mentioned pulmonary nodules do not require dedicated imaging follow-up based on the Fleischner guidelines unless otherwise specified. Coronary calcifications are not identified unless otherwise specified. COMPARISON: 08/22/2023 FINDINGS: The abdominal aorta is normal in caliber. Scattered atherosclerotic changes appear present. The celiac artery and SMA are patent. The renal arteries and IMA are patent. The common iliac arteries, internal and external iliac arteries, common femoral arteries and visualized portions of the superficial and deep  femoral arteries are patent. The lung bases demonstrate minimal atelectatic changes of the heart is normal in size. There are coronary calcifications. The liver appears normal other than a subcentimeter probable cyst. The gallbladder is normal. The spleen is normal. The pancreas is normal. Adrenals are normal. The kidneys are normal. The bladder is normal. The prostate is mildly enlarged. The appendix is normal. Large and small bowel loops are otherwise within normal limits. There is no free fluid or pathologic lymphadenopathy by size criteria. There are degenerative changes of the spine and bony pelvis. Old L1 and T10 compression fractures are seen. IMPRESSION: No acute findings. Patent mesenteric arteries. DOSE REDUCTION: This exam was performed according to our departmental dose-optimization program which includes automated exposure control, adjustment of the mA and/or kV according to patient size and/or use of iterative reconstruction technique. Electronically signed by: [place] Engel MD 09/30/2023 03:02 PM EDT RP Workstation: [id number]    Assessment/Plan:   Episodic RLQ pain (infrequent) with abnormal CT in 08/2023, with decompressed appendix, asymmetric  wall thickening of cecum, measuring 13mm. Colonoscopy with terminal ileitis (path neg), cecal ulceration with chronic active colitis and ulcer on path. CTA negative for mesenteric ischemia. Clinically doing well at this time. -continue to hold NSAIDs -question of IBD, consider following clinically, will discuss with Dr. Riley Cheadle.   H/o adenomatous colon polyps: -surveillance colonoscopy in 3 years.      Trudie Fuse. Harles Lied, MHS, PA-C [facility]

## 2023-10-28 ENCOUNTER — Telehealth: Payer: Self-pay | Admitting: Gastroenterology

## 2023-10-28 NOTE — Telephone Encounter (Signed)
 Please let pt know I discussed his case (colonoscopy findings) with Dr. Riley Cheadle again. He agrees with monitoring him of NSAIDs. Less likely to have inflammatory bowel disease, could have been due to NSAIDs.   He would like to see patient back in several months.   Make ov with Rourk only in 3 months.

## 2023-10-29 NOTE — Telephone Encounter (Signed)
 Pt was made aware and verbalized understanding.  Omar Ruiz/Omar Ruiz: arrange f/u with DR Riley Cheadle only in 3 mths.

## 2023-11-11 NOTE — Telephone Encounter (Signed)
 Yes, Omar Ruiz did. I sent a message to Dr. MALVA on 6/6 to interpret and sign the study the patient completed on 6/2. I am now following up with Dr. Jess today to complete the interpretation and signature for HST. Once that is done, I will upload the report to the patient's chart prior to appointment on 7/3. Apologies for the delay.

## 2023-11-12 ENCOUNTER — Encounter (HOSPITAL_BASED_OUTPATIENT_CLINIC_OR_DEPARTMENT_OTHER): Payer: Self-pay | Admitting: Pulmonary Disease

## 2023-11-12 ENCOUNTER — Ambulatory Visit (HOSPITAL_BASED_OUTPATIENT_CLINIC_OR_DEPARTMENT_OTHER): Admitting: Pulmonary Disease

## 2023-11-12 VITALS — BP 92/66 | HR 72 | Ht 72.0 in | Wt 274.0 lb

## 2023-11-12 DIAGNOSIS — Z9682 Presence of neurostimulator: Secondary | ICD-10-CM

## 2023-11-12 DIAGNOSIS — G4733 Obstructive sleep apnea (adult) (pediatric): Secondary | ICD-10-CM

## 2023-11-12 NOTE — Progress Notes (Signed)
 Subjective:    Patient ID: Omar Ruiz, male    DOB: 1950/11/25, 73 y.o.   MRN: [id number]  HPI  73 yo male followed for obstructive sleep apnea intolerant to CPAP status post inspire implant  April 16, 2022.  Activation June 24, 2022.   Inspire titration on Sep 23, 2022 showed optimal control on 1.6 V.    10/2022 OV  >> Changed pulse width 90/33 to 90/40 due to buzzing sound on higher level   Range set 1.1 to 1.6v. Changed to 1.4 v  >> increase to goal 1.6 V Duration 8 hours, pause 15 minutes, start delay 30 minutes  08/2023 trouble tolerating higher dose of stimulation was changed to electrode B configuration with range at 0.3 to 0.7 V.  He was set at 0.4-level 2.  09/2023    3 month FU visit  He experiences significant snoring and sleep disturbances despite the hypoglossal nerve stimulator implant, which is in a electrode B configuration @ 0.6 V. He struggles with anxiety about the device's effectiveness, which affects his ability to fall asleep. Compliance report shows frequent use of the pause function on the device, 93% usage > 4h He wakes up during the night, sometimes to urinate, and uses the pause function to help him fall back asleep. He feels the device does not provide adequate control.  He has tried different configurations of the device. Electrode A configuration caused discomfort, such as 'chattering in my teeth.' The second configuration was inadequate. He is currently trying a third configuration but remains uncertain about its effectiveness.  His snoring is significant enough to disturb his wife's sleep, leading him to sleep in separate rooms at times. He attempts to mitigate the snoring by placing his tongue between his teeth, which he finds somewhat helpful.   TEST/EVENTS :  PFT 03/18/17 >> FEV1 3.38 (87%), FEV1% 81, TLC 7.19 (94%), DLCO 79%   Sleep Tests:  HST 01/14/22 >> AHI 16.4, SpO2 low 70% Inspire implantation April 16, 2022 Inspire device  activation June 24, 2022 Inspire titration 09/23/22 >> 1.6 V and non-supine position.  Inspire titration 07/2023 >> no therapeutic amplitude obtained Watchpat 09/2023 pAHI 46/h   Cardiac Tests:  Echo 09/13/21 >> EF 65 to 70%, mild LVH, severe RA dilation, aortic root 37 mm      Review of Systems neg for any significant sore throat, dysphagia, itching, sneezing, nasal congestion or excess/ purulent secretions, fever, chills, sweats, unintended wt loss, pleuritic or exertional cp, hempoptysis, orthopnea pnd or change in chronic leg swelling. Also denies presyncope, palpitations, heartburn, abdominal pain, nausea, vomiting, diarrhea or change in bowel or urinary habits, dysuria,hematuria, rash, arthralgias, visual complaints, headache, numbness weakness or ataxia.     Objective:   Physical Exam  Gen. Pleasant, obese, in no distress ENT - no lesions, no post nasal drip Neck: No JVD, no thyromegaly, no carotid bruits Lungs: no use of accessory muscles, no dullness to percussion, decreased without rales or rhonchi  Cardiovascular: Rhythm regular, heart sounds  normal, no murmurs or gallops, no peripheral edema Musculoskeletal: No deformities, no cyanosis or clubbing , no tremors  Procedure: Hypoglossal nerve stimulator adjustment Description: Electrode B configuration increased to 0.7 volts. Feels at back of tongue .Electrode C configuration varied between 0.5 to 0.7 volts. Stimulation felt at the  front of the tongue.     Assessment & Plan:    OSA Sleep apnea with suboptimal control despite hypoglossal nerve stimulator implant. Current settings provide approximately 50% improvement, but  not at the desired level of control. Snoring persists, and there are frequent awakenings. Previous configurations were either too strong or ineffective. - Increase electrode B configuration to 0.7 volts and monitor response. - Plan awake endoscopy with device configuration adjustments if no improvement  with current settings. Other option is in lab study with advanced titration - Instruct to provide feedback in one week regarding snoring and tolerance of new settings. - Coordinate endoscopy procedure in 3-4 weeks if no improvement is reported.

## 2023-11-12 NOTE — H&P (View-Only) (Signed)
 Subjective:    Patient ID: Omar Ruiz, male    DOB: 1950/11/25, 73 y.o.   MRN: [id number]  HPI  73 yo male followed for obstructive sleep apnea intolerant to CPAP status post inspire implant  April 16, 2022.  Activation June 24, 2022.   Inspire titration on Sep 23, 2022 showed optimal control on 1.6 V.    10/2022 OV  >> Changed pulse width 90/33 to 90/40 due to buzzing sound on higher level   Range set 1.1 to 1.6v. Changed to 1.4 v  >> increase to goal 1.6 V Duration 8 hours, pause 15 minutes, start delay 30 minutes  08/2023 trouble tolerating higher dose of stimulation was changed to electrode B configuration with range at 0.3 to 0.7 V.  He was set at 0.4-level 2.  09/2023    3 month FU visit  He experiences significant snoring and sleep disturbances despite the hypoglossal nerve stimulator implant, which is in a electrode B configuration @ 0.6 V. He struggles with anxiety about the device's effectiveness, which affects his ability to fall asleep. Compliance report shows frequent use of the pause function on the device, 93% usage > 4h He wakes up during the night, sometimes to urinate, and uses the pause function to help him fall back asleep. He feels the device does not provide adequate control.  He has tried different configurations of the device. Electrode A configuration caused discomfort, such as 'chattering in my teeth.' The second configuration was inadequate. He is currently trying a third configuration but remains uncertain about its effectiveness.  His snoring is significant enough to disturb his wife's sleep, leading him to sleep in separate rooms at times. He attempts to mitigate the snoring by placing his tongue between his teeth, which he finds somewhat helpful.   TEST/EVENTS :  PFT 03/18/17 >> FEV1 3.38 (87%), FEV1% 81, TLC 7.19 (94%), DLCO 79%   Sleep Tests:  HST 01/14/22 >> AHI 16.4, SpO2 low 70% Inspire implantation April 16, 2022 Inspire device  activation June 24, 2022 Inspire titration 09/23/22 >> 1.6 V and non-supine position.  Inspire titration 07/2023 >> no therapeutic amplitude obtained Watchpat 09/2023 pAHI 46/h   Cardiac Tests:  Echo 09/13/21 >> EF 65 to 70%, mild LVH, severe RA dilation, aortic root 37 mm      Review of Systems neg for any significant sore throat, dysphagia, itching, sneezing, nasal congestion or excess/ purulent secretions, fever, chills, sweats, unintended wt loss, pleuritic or exertional cp, hempoptysis, orthopnea pnd or change in chronic leg swelling. Also denies presyncope, palpitations, heartburn, abdominal pain, nausea, vomiting, diarrhea or change in bowel or urinary habits, dysuria,hematuria, rash, arthralgias, visual complaints, headache, numbness weakness or ataxia.     Objective:   Physical Exam  Gen. Pleasant, obese, in no distress ENT - no lesions, no post nasal drip Neck: No JVD, no thyromegaly, no carotid bruits Lungs: no use of accessory muscles, no dullness to percussion, decreased without rales or rhonchi  Cardiovascular: Rhythm regular, heart sounds  normal, no murmurs or gallops, no peripheral edema Musculoskeletal: No deformities, no cyanosis or clubbing , no tremors  Procedure: Hypoglossal nerve stimulator adjustment Description: Electrode B configuration increased to 0.7 volts. Feels at back of tongue .Electrode C configuration varied between 0.5 to 0.7 volts. Stimulation felt at the  front of the tongue.     Assessment & Plan:    OSA Sleep apnea with suboptimal control despite hypoglossal nerve stimulator implant. Current settings provide approximately 50% improvement, but  not at the desired level of control. Snoring persists, and there are frequent awakenings. Previous configurations were either too strong or ineffective. - Increase electrode B configuration to 0.7 volts and monitor response. - Plan awake endoscopy with device configuration adjustments if no improvement  with current settings. Other option is in lab study with advanced titration - Instruct to provide feedback in one week regarding snoring and tolerance of new settings. - Coordinate endoscopy procedure in 3-4 weeks if no improvement is reported.

## 2023-11-12 NOTE — Patient Instructions (Signed)
 We increased to level 5 = 0.7 V on electrode B configuration  Report back in 1-2 weeks  We will plan on awake endoscopy if not improved

## 2023-11-19 ENCOUNTER — Telehealth: Payer: Self-pay | Admitting: Pulmonary Disease

## 2023-11-19 DIAGNOSIS — G4733 Obstructive sleep apnea (adult) (pediatric): Secondary | ICD-10-CM

## 2023-11-19 NOTE — Telephone Encounter (Signed)
 Call patient  Sleep study result  Date of study: 10/08/2023  Impression: Severe obstructive sleep apnea with AHI of 56.9, O2 nadir 52% Inspire device patient, suboptimal control of events  Recommendation: Close clinical follow-up for optimization of treatment  May benefit from in-lab titration study  Needs evaluation for oxygen supplementation

## 2023-11-27 ENCOUNTER — Telehealth (HOSPITAL_BASED_OUTPATIENT_CLINIC_OR_DEPARTMENT_OTHER): Payer: Self-pay

## 2023-11-27 DIAGNOSIS — Z9682 Presence of neurostimulator: Secondary | ICD-10-CM | POA: Insufficient documentation

## 2023-11-27 DIAGNOSIS — G4733 Obstructive sleep apnea (adult) (pediatric): Secondary | ICD-10-CM | POA: Insufficient documentation

## 2023-11-27 NOTE — Telephone Encounter (Signed)
 Spoke with [place] one of the [employer] and informed him of Mr. Douthat upcoming endoscopy. They are aware and confirmed to be present on 7/22. Also reached out to Mr. Tangonan and informed him of the procedure date and time.

## 2023-11-27 NOTE — Telephone Encounter (Signed)
 Copied from CRM [id number]. Topic: Clinical - Medical Advice >> Nov 27, 2023  9:21 AM Isabell A wrote: Reason for CRM: Patient was told to call back in two weeks to let provider know how his snoring has been - his snoring has not improved.   Callback number: [phone]

## 2023-11-27 NOTE — Telephone Encounter (Signed)
 Will plan for DISE/ endoscopy on 7/22 like we had discussed on office visit Have spoken with endoscopy [place], please confirm with inspire rep can be present and please let patient know   Provider performing procedure:Daylin Eads Diagnosis: OSA Procedure: DISE  Has patient been spoken to by Provider and given informed consent? Y Anesthesia: propofol  Date: 7/22  Time: AM730  Location: [facility] Does patient have Latex allergy? NA Medication Restriction/ Anticoagulate/Antiplatelet: NA Is patient on GLP-1 agonist? NA, must hold for procedure. Pre-op Labs Ordered:determined by Anesthesia  Please coordinate Pre-op COVID Testing

## 2023-11-30 NOTE — Anesthesia Preprocedure Evaluation (Signed)
 Anesthesia Evaluation  Patient identified by MRN, date of birth, ID band Patient awake    Reviewed: Allergy & Precautions, NPO status , Patient's Chart, lab work & pertinent test results, reviewed documented beta blocker date and time   Airway Mallampati: III  TM Distance: >3 FB Neck ROM: Full    Dental  (+) Teeth Intact, Dental Advisory Given   Pulmonary sleep apnea , COPD, former smoker S/p Inspire implant   Pulmonary exam normal breath sounds clear to auscultation       Cardiovascular hypertension, Pt. on home beta blockers + CAD and + Cardiac Stents (LAD)  Normal cardiovascular exam+ dysrhythmias Supra Ventricular Tachycardia  Rhythm:Regular Rate:Normal     Neuro/Psych  PSYCHIATRIC DISORDERS  Depression    negative neurological ROS     GI/Hepatic Neg liver ROS, PUD,,,  Endo/Other  Obesity   Renal/GU negative Renal ROS     Musculoskeletal  (+) Arthritis ,    Abdominal   Peds  Hematology negative hematology ROS (+)   Anesthesia Other Findings   Reproductive/Obstetrics                              Anesthesia Physical Anesthesia Plan  ASA: 3  Anesthesia Plan: General   Post-op Pain Management: Minimal or no pain anticipated   Induction: Intravenous  PONV Risk Score and Plan: 2 and TIVA  Airway Management Planned: Natural Airway and Simple Face Mask  Additional Equipment:   Intra-op Plan:   Post-operative Plan:   Informed Consent: I have reviewed the patients History and Physical, chart, labs and discussed the procedure including the risks, benefits and alternatives for the proposed anesthesia with the patient or authorized representative who has indicated his/her understanding and acceptance.     Dental advisory given  Plan Discussed with: CRNA  Anesthesia Plan Comments:          Anesthesia Quick Evaluation

## 2023-12-01 ENCOUNTER — Other Ambulatory Visit: Payer: Self-pay

## 2023-12-01 ENCOUNTER — Encounter (HOSPITAL_COMMUNITY)
Admission: RE | Disposition: A | Discharge status: Home / Self Care | Payer: Self-pay | Source: Ambulatory Visit | Attending: Pulmonary Disease

## 2023-12-01 ENCOUNTER — Ambulatory Visit (HOSPITAL_COMMUNITY)
Admission: RE | Admit: 2023-12-01 | Discharge: 2023-12-01 | Disposition: A | Discharge status: Home / Self Care | Source: Ambulatory Visit | Attending: Pulmonary Disease | Admitting: Pulmonary Disease

## 2023-12-01 ENCOUNTER — Encounter (HOSPITAL_COMMUNITY): Payer: Self-pay | Admitting: Pulmonary Disease

## 2023-12-01 ENCOUNTER — Encounter (HOSPITAL_COMMUNITY): Payer: Self-pay | Admitting: Anesthesiology

## 2023-12-01 ENCOUNTER — Ambulatory Visit (HOSPITAL_COMMUNITY): Payer: Self-pay | Admitting: Anesthesiology

## 2023-12-01 ENCOUNTER — Encounter: Payer: Self-pay | Admitting: Pulmonary Disease

## 2023-12-01 DIAGNOSIS — I10 Essential (primary) hypertension: Secondary | ICD-10-CM | POA: Insufficient documentation

## 2023-12-01 DIAGNOSIS — Z87891 Personal history of nicotine dependence: Secondary | ICD-10-CM | POA: Diagnosis not present

## 2023-12-01 DIAGNOSIS — G4733 Obstructive sleep apnea (adult) (pediatric): Secondary | ICD-10-CM | POA: Diagnosis not present

## 2023-12-01 DIAGNOSIS — Z955 Presence of coronary angioplasty implant and graft: Secondary | ICD-10-CM | POA: Diagnosis not present

## 2023-12-01 DIAGNOSIS — Z9682 Presence of neurostimulator: Secondary | ICD-10-CM | POA: Insufficient documentation

## 2023-12-01 DIAGNOSIS — G4730 Sleep apnea, unspecified: Secondary | ICD-10-CM | POA: Diagnosis not present

## 2023-12-01 DIAGNOSIS — J449 Chronic obstructive pulmonary disease, unspecified: Secondary | ICD-10-CM | POA: Insufficient documentation

## 2023-12-01 DIAGNOSIS — E669 Obesity, unspecified: Secondary | ICD-10-CM | POA: Insufficient documentation

## 2023-12-01 DIAGNOSIS — I2510 Atherosclerotic heart disease of native coronary artery without angina pectoris: Secondary | ICD-10-CM | POA: Diagnosis not present

## 2023-12-01 DIAGNOSIS — Z6837 Body mass index (BMI) 37.0-37.9, adult: Secondary | ICD-10-CM | POA: Insufficient documentation

## 2023-12-01 HISTORY — PX: DRUG INDUCED ENDOSCOPY: SHX6808

## 2023-12-01 SURGERY — DRUG INDUCED SLEEP ENDOSCOPY
Anesthesia: General

## 2023-12-01 MED ORDER — SODIUM CHLORIDE 0.9 % IV SOLN
INTRAVENOUS | Status: AC | PRN
  Administered 2023-12-01: 500 mL via INTRAMUSCULAR

## 2023-12-01 MED ORDER — LACTATED RINGERS IV SOLN: INTRAVENOUS | Status: DC

## 2023-12-01 MED ORDER — OXYMETAZOLINE HCL 0.05 % NA SOLN
NASAL | Status: DC | PRN
  Administered 2023-12-01: 2

## 2023-12-01 MED ORDER — OXYMETAZOLINE HCL 0.05 % NA SOLN
NASAL | Status: AC
  Filled 2023-12-01: qty 30

## 2023-12-01 MED ORDER — BUTAMBEN-TETRACAINE-BENZOCAINE 2-2-14 % EX AERO
INHALATION_SPRAY | CUTANEOUS | Status: DC | PRN
  Administered 2023-12-01: 2 via TOPICAL

## 2023-12-01 NOTE — Interval H&P Note (Signed)
 History and Physical Interval Note:  12/01/2023 7:34 AM  Omar Ruiz  has presented today for surgery, with the diagnosis of osa.  The various methods of treatment have been discussed with the patient and family. After consideration of risks, benefits and other options for treatment, the patient has consented to  Procedure(s): DRUG INDUCED SLEEP ENDOSCOPY (N/A) as a surgical intervention.  The patient's history has been reviewed, patient examined, no change in status, stable for surgery.  I have reviewed the patient's chart and labs.  Questions were answered to the patient's satisfaction.     Harden ROCKFORD Othman Masur

## 2023-12-01 NOTE — Telephone Encounter (Signed)
 Home sleep test in  64month - needs 2 night study - first night on level 3 /0.9V & second night on level 4/1.0 V FU appt in 3 months

## 2023-12-01 NOTE — Discharge Instructions (Addendum)
 Your device is set at 0.9 V= level 3 which appeared to be the optimal level during endoscopy study You can increase to level 4 if increased snoring Please use a chin strap since airway collapse is worse with your mouth open Home sleep test in  33month FU appt in 3 months

## 2023-12-01 NOTE — Op Note (Signed)
 Procedure: Evaluation of sleep-disordered breathing by examination of upper airway using an endoscope  CPT Codes: 57024 Evaluation of sleep-disordered breathing by examination of upper airway using an endoscope  Pre-Op Diagnose: Moderate /Severe obstructive sleep apnea with positive airway pressure intolerance (ICD-10 G47.33).  Post-Op Diagnosis: Moderate /Severe obstructive sleep apnea with positive pressure airway intolerance (ICD-10 G47.33).  ANESTHESIA: local cetacaine  spray  ESTIMATED BLOOD LOSS: None.  COMPLICATIONS: None.  BRIEF CLINICAL HISTORY: This is a 73 year old patient with a history of moderate to severe symptomatic obstructive sleep apnea, who is intolerant and unable to achieve benefit with positive pressure therapy.He  presents today for awake endoscopy to better characterize  locations and pattern of obstruction and to predict appropriate medical and/or surgical options moving forward.  PROCEDURE FINDINGS: There was no evidence of complete concentric palatal obstruction . He was studied with different electrode configurations , over a range of amplitudes. Therapeutic amplitude was obtained on electrode A configuration and amplitudes of 0.9 and 1.0 V.  There was increase palatal collapse with mouth opening He was also studied in left lateral position   In summary, there was no evidence of complete concentric palatal obstruction . Therapeutic amplitude was obtained at 0.9 & 1.0 V with electrode A..   I was present for and performed the entire procedure.  Dictated By: Harden ROCKFORD Jude MD  Post-Op Plan: Device adjusted to range 0.7-1.3 & set at 0.9V  PWR 90/33  Diagnostic Codes: G47.33 Obstructive sleep apnea (adult)    Garl Speigner V. Jude MD

## 2023-12-01 NOTE — Telephone Encounter (Signed)
 Good Morning,  I will go ahead and get that scheduled. I'll also call the patient to schedule a follow-up with you for October and notify the [facility] reps as well. Please let me know if anything else if needed, thank you.

## 2023-12-31 ENCOUNTER — Encounter

## 2024-01-06 ENCOUNTER — Ambulatory Visit (HOSPITAL_BASED_OUTPATIENT_CLINIC_OR_DEPARTMENT_OTHER)

## 2024-01-06 DIAGNOSIS — G4733 Obstructive sleep apnea (adult) (pediatric): Secondary | ICD-10-CM

## 2024-01-08 ENCOUNTER — Encounter: Payer: Self-pay | Admitting: Internal Medicine

## 2024-01-28 ENCOUNTER — Encounter (HOSPITAL_BASED_OUTPATIENT_CLINIC_OR_DEPARTMENT_OTHER)

## 2024-02-17 ENCOUNTER — Telehealth: Payer: Self-pay | Admitting: Pulmonary Disease

## 2024-02-17 NOTE — Telephone Encounter (Signed)
 Residual AHI of 25/h  Please confirm device wa son for the night of study 9/19 Please make appt on inspire day to discuss

## 2024-02-18 ENCOUNTER — Encounter (HOSPITAL_BASED_OUTPATIENT_CLINIC_OR_DEPARTMENT_OTHER): Payer: Self-pay | Admitting: Pulmonary Disease

## 2024-02-18 ENCOUNTER — Ambulatory Visit (HOSPITAL_BASED_OUTPATIENT_CLINIC_OR_DEPARTMENT_OTHER): Admitting: Pulmonary Disease

## 2024-02-18 VITALS — BP 131/75 | HR 65 | Ht 72.0 in | Wt 269.9 lb

## 2024-02-18 DIAGNOSIS — Z9682 Presence of neurostimulator: Secondary | ICD-10-CM

## 2024-02-18 DIAGNOSIS — G4733 Obstructive sleep apnea (adult) (pediatric): Secondary | ICD-10-CM

## 2024-02-18 NOTE — Patient Instructions (Signed)
 X Trial of chin strap  If does not work, call us  & we will refer to dentist for oral appliance  If works, we will schedule repeat home test   Use inspire every night

## 2024-02-18 NOTE — Progress Notes (Signed)
 Subjective:    Patient ID: Omar Ruiz, male    DOB: January 30, 1951, 73 y.o.   MRN: [id number]   73 yo male followed for obstructive sleep apnea intolerant to CPAP status post inspire implant  April 16, 2022.  Activation June 24, 2022.   Inspire titration on Sep 23, 2022 showed optimal control on 1.6 V.    10/2022 OV  >> Changed pulse width 90/33 to 90/40 due to buzzing sound on higher level   Range set 1.1 to 1.6v. Changed to 1.4 v  >> increase to goal 1.6 V Duration 8 hours, pause 15 minutes, start delay 30 minutes   08/2023 trouble tolerating higher dose of stimulation was changed to electrode B configuration with range at 0.3 to 0.7 V.  He was set at 0.4-level 2.  11/2023 >> elec B to 0.7 V  Discussed the use of AI scribe software for clinical note transcription with the patient, who gave verbal consent to proceed.  History of Present Illness Omar Ruiz is a 73 year old male with sleep apnea who presents for follow-up regarding persistent sleep disturbances. He is accompanied by his wife.  He experiences approximately 25 sleep disturbance events per hour, as shown in recent sleep studies, regardless of sleeping position. He uses a CPAP device, initially set to a pressure of four, but reduced to three due to discomfort described as a sensation of 'drowning.' He finds the lower setting more tolerable.  He has tried using a chin strap to keep his mouth closed during sleep, which was noted to improve his airway during an endoscopy. However, he finds it uncomfortable due to facial hair irritation and often removes it after an hour. He now uses an older, simpler chin strap that is less intrusive.  His wife reports continued snoring, and he can hear himself snoring when relaxing his head back at night. He denies nasal obstruction or the need for nasal sprays. He has experimented with different sleeping arrangements, including moving between three rooms and sometimes sleeping on the  couch, to manage his sleep disturbances.   Incoming elec A 0.9 V  TEST/EVENTS :  PFT 03/18/17 >> FEV1 3.38 (87%), FEV1% 81, TLC 7.19 (94%), DLCO 79%   Sleep Tests:  HST 01/14/22 >> AHI 16.4, SpO2 low 70% Inspire implantation April 16, 2022 Inspire device activation June 24, 2022 Inspire titration 09/23/22 >> 1.6 V and non-supine position.  Inspire titration 07/2023 >> no therapeutic amplitude obtained Watchpat 09/2023 pAHI 46/h Awake endoscopy >> He was studied with different electrode configurations , over a range of amplitudes.Best airway opening was obtained on electrode A configuration and amplitudes of 0.9 and 1.0 V.  There was increase palatal collapse with mouth opening.He was also studied in left lateral position HST 01/29/24 Residual AHI of 25/h on elec A 0.9 V    Cardiac Tests:  Echo 09/13/21 >> EF 65 to 70%, mild LVH, severe RA dilation, aortic root 37 mm    Review of Systems  neg for any significant sore throat, dysphagia, itching, sneezing, nasal congestion or excess/ purulent secretions, fever, chills, sweats, unintended wt loss, pleuritic or exertional cp, hempoptysis, orthopnea pnd or change in chronic leg swelling. Also denies presyncope, palpitations, heartburn, abdominal pain, nausea, vomiting, diarrhea or change in bowel or urinary habits, dysuria,hematuria, rash, arthralgias, visual complaints, headache, numbness weakness or ataxia.      Objective:   Physical Exam  Gen. Pleasant, obese, in no distress ENT - no lesions, no post nasal  drip Neck: No JVD, no thyromegaly, no carotid bruits Lungs: no use of accessory muscles, no dullness to percussion, decreased without rales or rhonchi  Cardiovascular: Rhythm regular, heart sounds  normal, no murmurs or gallops, no peripheral edema Musculoskeletal: No deformities, no cyanosis or clubbing , no tremors   Checked with programmer     Assessment & Plan:   Assessment and Plan Assessment & Plan Obstructive sleep  apnea with persistent snoring Characterized by approximately 25 events per hour, regardless of sleeping position. . Attempts to use a chin strap to keep the mouth closed have been met with limited success due to discomfort from facial hair. Snoring persists, and he reports that holding his head back at night seems to help. Awake endoscopy showed improved airway patency with mouth closed and jaw thrust. Compliance report reviewed  -stay on  elec A 0.9 V - Encourage use of chin strap to keep mouth closed during sleep. - If chin strap is ineffective, refer to a dentist for a dental appliance to advance the jaw. - Consider a sleep study with chin strap to objectively assess improvement. - Re-evaluate in 3-4 months to assess progress.

## 2024-02-20 ENCOUNTER — Other Ambulatory Visit: Payer: Self-pay | Admitting: Internal Medicine

## 2024-03-16 ENCOUNTER — Other Ambulatory Visit: Payer: Self-pay

## 2024-03-16 DIAGNOSIS — I77810 Thoracic aortic ectasia: Secondary | ICD-10-CM

## 2024-04-12 ENCOUNTER — Telehealth: Payer: Self-pay | Admitting: Internal Medicine

## 2024-04-15 NOTE — Telephone Encounter (Signed)
 Patient is following up regarding this request. Please advise.

## 2024-04-18 NOTE — Telephone Encounter (Signed)
 Pt scheduled to see [name], PA, 05/23/24, refill sent.

## 2024-05-16 ENCOUNTER — Telehealth: Payer: Self-pay | Admitting: Pulmonary Disease

## 2024-05-16 DIAGNOSIS — G4733 Obstructive sleep apnea (adult) (pediatric): Secondary | ICD-10-CM

## 2024-05-16 NOTE — Telephone Encounter (Signed)
 Per pt chin strap is working and he is also using a wedge pillow and his wife is not complaining about his snoring so he would like ot do repeat sleep study.

## 2024-05-16 NOTE — Telephone Encounter (Signed)
 Per AVS from October 2025, Trial of chin strap. If does not work, call us  & we will refer to dentist for oral appliance If works, we will schedule repeat home test  Please advise if HST is needed prior to follow up in February 2026?

## 2024-05-16 NOTE — Telephone Encounter (Signed)
 Order placed

## 2024-05-17 NOTE — Progress Notes (Signed)
 " Cardiology Office Note:  .   Date:  05/23/2024  ID:  Ruiz Omar Loge, DOB 04/12/1951, MRN [id number] PCP: Omar Pfeiffer, NP  [facility] Providers Cardiologist:  Omar Ruiz Maywood, MD    History of Present Illness: .   Omar Ruiz is a 74 y.o. male  CAD s/p LAD PCI in 2011, aortic dilatation (4.2 cm of aortic root and 4 cm ascending aorta in 12/2021), OSA s/p inspire, possible TIA.  Patient comes in with his wife. Denies chest pain, dyspnea, palpitations, edema. He walks 1/2-3/4 mile twice a day.   ROS:    Studies Reviewed: [name]    EKG Interpretation Date/Time:  Monday May 23 2024 10:18:04 EST Ventricular Rate:  65 PR Interval:  174 QRS Duration:  94 QT Interval:  400 QTC Calculation: 416 R Axis:   -20  Text Interpretation: Normal sinus rhythm Minimal voltage criteria for LVH, may be normal variant ( R in aVL ) When compared with ECG of 20-Mar-2023 12:52, T wave inversion now evident in Inferior leads but no change from 2023 EKG Confirmed by Omar Ruiz [phone]) on 05/23/2024 10:35:43 AM    Prior CV Studies:   CT Angio chest/aorta 10/2022 IMPRESSION: 1. Unchanged mild dilation of the aortic root at the sinuses of Valsalva measuring 4.2 cm and 4.0 cm and ascending thoracic aortic aneurysm. Recommend annual imaging followup by CTA or MRA. This recommendation follows 2010 ACCF/AHA/AATS/ACR/ASA/SCA/SCAI/SIR/STS/SVM Guidelines for the Diagnosis and Management of Patients with Thoracic Aortic Disease. Circulation. 2010; 121: [id number]. Aortic aneurysm NOS (ICD10-I71.9)   Aortic Atherosclerosis (ICD10-I70.0).     Electronically Signed   By: Omar Ruiz M.D.   On: 10/18/2022 21:40   LHC 2023 CONCLUSIONS: Left main was normal Mid LAD contains de novo segmental 30 to 40% narrowing.  LAD stent contains distal 30% in-stent restenosis.  LAD does not contain any obstructive disease. Circumflex contains 40% ostial to proximal narrowing and 30% distal  narrowing.  No obstructive disease is noted. RCA is dominant and widely patent. LV function is normal.  EF is 55%.  EDP is normal.   RECOMMENDATIONS: Continue preventive therapy and risk factor modification.      Risk Assessment/Calculations:             Physical Exam:   VS:  BP 130/86 (BP Location: Left Arm, Cuff Size: Large)   Pulse 66   Ht 6' (1.829 m)   Wt 266 lb 3.2 oz (120.7 kg)   SpO2 93%   BMI 36.10 kg/m    Orhtostatics: No data found. Wt Readings from Last 3 Encounters:  05/23/24 266 lb 3.2 oz (120.7 kg)  02/18/24 269 lb 14.4 oz (122.4 kg)  12/01/23 275 lb (124.7 kg)    GEN: Obese, in no acute distress NECK: No JVD; No carotid bruits CARDIAC:  RRR, no murmurs, rubs, gallops RESPIRATORY:  Clear to auscultation without rales, wheezing or rhonchi  ABDOMEN: Soft, non-tender, non-distended EXTREMITIES:  No edema; No deformity   ASSESSMENT AND PLAN: .    CAD s/p PCI LAD 201, last cath 01/2022 30% instent restenosis, 40% Cfx, normal RCA and LVEF. -no angina -continue ASA, Toprl,zocor  and zetia   Aortic root dilation 4.2 cm 4 cm ascending thoracic aorta 10/2022-overdue for repeat-will order  OSA s/p inspire, chin strap, and wedge.  HTN-well controlled  HLD goal LDL less than 70 LDL close to goal at 79, trig 168 02/2024  Possible TIA-on ASA        Dispo: f/u Dr. Mallipeddi  1 yr.  Signed, Omar Pavy, PA-C   "

## 2024-05-20 NOTE — Addendum Note (Signed)
 Addended by: Shamone Winzer L on: 05/20/2024 01:52 PM   Modules accepted: Orders

## 2024-05-23 ENCOUNTER — Ambulatory Visit: Payer: Self-pay | Admitting: Physician Assistant

## 2024-05-23 ENCOUNTER — Ambulatory Visit: Attending: Physician Assistant | Admitting: Physician Assistant

## 2024-05-23 ENCOUNTER — Encounter: Payer: Self-pay | Admitting: Physician Assistant

## 2024-05-23 ENCOUNTER — Other Ambulatory Visit (HOSPITAL_COMMUNITY)
Admission: RE | Admit: 2024-05-23 | Discharge: 2024-05-23 | Disposition: A | Discharge status: Home / Self Care | Source: Ambulatory Visit | Attending: Physician Assistant | Admitting: Physician Assistant

## 2024-05-23 VITALS — BP 130/86 | HR 66 | Ht 72.0 in | Wt 266.2 lb

## 2024-05-23 DIAGNOSIS — G4733 Obstructive sleep apnea (adult) (pediatric): Secondary | ICD-10-CM

## 2024-05-23 DIAGNOSIS — I77810 Thoracic aortic ectasia: Secondary | ICD-10-CM | POA: Diagnosis present

## 2024-05-23 DIAGNOSIS — Z9861 Coronary angioplasty status: Secondary | ICD-10-CM | POA: Diagnosis not present

## 2024-05-23 DIAGNOSIS — I10 Essential (primary) hypertension: Secondary | ICD-10-CM | POA: Insufficient documentation

## 2024-05-23 DIAGNOSIS — G459 Transient cerebral ischemic attack, unspecified: Secondary | ICD-10-CM | POA: Diagnosis not present

## 2024-05-23 DIAGNOSIS — I2510 Atherosclerotic heart disease of native coronary artery without angina pectoris: Secondary | ICD-10-CM | POA: Diagnosis not present

## 2024-05-23 DIAGNOSIS — E782 Mixed hyperlipidemia: Secondary | ICD-10-CM | POA: Diagnosis not present

## 2024-05-23 LAB — BASIC METABOLIC PANEL WITH GFR
Anion gap: 5 (ref 5–15)
BUN: 8 mg/dL (ref 8–23)
CO2: 32 mmol/L (ref 22–32)
Calcium: 9.1 mg/dL (ref 8.9–10.3)
Chloride: 104 mmol/L (ref 98–111)
Creatinine, Ser: 1.02 mg/dL (ref 0.61–1.24)
GFR, Estimated: >60 mL/min
Glucose, Bld: 94 mg/dL (ref 70–99)
Potassium: 4.4 mmol/L (ref 3.5–5.1)
Sodium: 141 mmol/L (ref 135–145)

## 2024-05-23 NOTE — Patient Instructions (Signed)
 Medication Instructions:  Your physician recommends that you continue on your current medications as directed. Please refer to the Current Medication list given to you today.  *If you need a refill on your cardiac medications before your next appointment, please call your pharmacy*  Lab Work: Your physician recommends that you return for lab work in: Today   Please have this done at [facility] (hours-Monday through Friday from 8:00 am to 4:00 pm except 11:30 am to 12:10 pm)  If you have labs (blood work) drawn today and your tests are completely normal, you will receive your results only by: MyChart Message (if you have MyChart) OR A paper copy in the mail If you have any lab test that is abnormal or we need to change your treatment, we will call you to review the results.  Testing/Procedures: Non-Cardiac CT Angiography (CTA), is a special type of CT scan that uses a computer to produce multi-dimensional views of major blood vessels throughout the body. In CT angiography, a contrast material is injected through an IV to help visualize the blood vessels   Follow-Up: At [facility], you and your health needs are our priority.  As part of our continuing mission to provide you with exceptional heart care, our providers are all part of one team.  This team includes your primary Cardiologist (physician) and Advanced Practice Providers or APPs (Physician Assistants and Nurse Practitioners) who all work together to provide you with the care you need, when you need it.  Your next appointment:   1 year(s)  Provider:   Vishnu Mallipeddi, MD    We recommend signing up for the patient portal called MyChart.  Sign up information is provided on this After Visit Summary.  MyChart is used to connect with patients for Virtual Visits (Telemedicine).  Patients are able to view lab/test results, encounter notes, upcoming appointments, etc.  Non-urgent messages can be sent to your provider as  well.   To learn more about what you can do with MyChart, go to [email or web address].   Other Instructions Thank you for choosing [facility]!

## 2024-05-26 ENCOUNTER — Ambulatory Visit (HOSPITAL_BASED_OUTPATIENT_CLINIC_OR_DEPARTMENT_OTHER)

## 2024-06-04 ENCOUNTER — Telehealth: Payer: Self-pay | Admitting: Pulmonary Disease

## 2024-06-04 DIAGNOSIS — G4733 Obstructive sleep apnea (adult) (pediatric): Secondary | ICD-10-CM | POA: Diagnosis not present

## 2024-06-04 NOTE — Telephone Encounter (Signed)
 Call patient  Sleep study result  Date of study: 05/29/2024  Impression: Severe obstructive sleep apnea with an AHI of 48.8. Moderate oxygen desaturations with an O2 nadir of 80%, saturations were below 88% for 43 minutes. High hypoxic burden No dysrhythmias noted  Last office note indicates on electrode B on amplitude of 0.7 V Suboptimal control of events on current amplitude   Recommendation: Suggest further evaluation and optimization of treatment Encourage close clinical follow-up Avoid alcohol, sedatives and other CNS depressants that may worsen sleep apnea and disrupt normal sleep architecture. Sleep hygiene should be reviewed to assess factors that may improve sleep quality. Weight management and regular exercise should be initiated or continued  Early clinical follow-up recommended

## 2024-06-07 ENCOUNTER — Ambulatory Visit (HOSPITAL_COMMUNITY)
Admission: RE | Admit: 2024-06-07 | Discharge: 2024-06-07 | Disposition: A | Discharge status: Home / Self Care | Source: Ambulatory Visit | Attending: Internal Medicine | Admitting: Internal Medicine

## 2024-06-07 DIAGNOSIS — I77810 Thoracic aortic ectasia: Secondary | ICD-10-CM | POA: Insufficient documentation

## 2024-06-07 MED ORDER — IOHEXOL 350 MG/ML SOLN
100.0000 mL | Freq: Once | INTRAVENOUS | Status: AC | PRN
  Administered 2024-06-07: 75 mL via INTRAVENOUS

## 2024-06-07 NOTE — Telephone Encounter (Signed)
 Patient is scheduled 06/30/2024 at 10:15am with Alva. Patient is aware. Nothing further needed.

## 2024-06-17 ENCOUNTER — Ambulatory Visit: Payer: Self-pay | Admitting: Internal Medicine

## 2024-06-30 ENCOUNTER — Encounter (HOSPITAL_BASED_OUTPATIENT_CLINIC_OR_DEPARTMENT_OTHER): Admitting: Pulmonary Disease

## 2024-08-09 ENCOUNTER — Ambulatory Visit: Admitting: Internal Medicine
